# Patient Record
Sex: Female | Born: 1960
Health system: Southern US, Community
[De-identification: ages and names within clinical notes are randomized; demographics above are authoritative.]

## PROBLEM LIST (undated history)

## (undated) DIAGNOSIS — F32A Depression, unspecified: Secondary | ICD-10-CM

## (undated) DIAGNOSIS — Z9889 Other specified postprocedural states: Secondary | ICD-10-CM

## (undated) DIAGNOSIS — I10 Essential (primary) hypertension: Secondary | ICD-10-CM

## (undated) DIAGNOSIS — M459 Ankylosing spondylitis of unspecified sites in spine: Secondary | ICD-10-CM

## (undated) DIAGNOSIS — R011 Cardiac murmur, unspecified: Secondary | ICD-10-CM

## (undated) DIAGNOSIS — M1990 Unspecified osteoarthritis, unspecified site: Secondary | ICD-10-CM

## (undated) DIAGNOSIS — G4730 Sleep apnea, unspecified: Secondary | ICD-10-CM

## (undated) DIAGNOSIS — E785 Hyperlipidemia, unspecified: Secondary | ICD-10-CM

## (undated) DIAGNOSIS — D649 Anemia, unspecified: Secondary | ICD-10-CM

## (undated) DIAGNOSIS — G4700 Insomnia, unspecified: Secondary | ICD-10-CM

## (undated) DIAGNOSIS — J3089 Other allergic rhinitis: Secondary | ICD-10-CM

## (undated) DIAGNOSIS — J189 Pneumonia, unspecified organism: Secondary | ICD-10-CM

## (undated) DIAGNOSIS — R112 Nausea with vomiting, unspecified: Secondary | ICD-10-CM

## (undated) DIAGNOSIS — K219 Gastro-esophageal reflux disease without esophagitis: Secondary | ICD-10-CM

## (undated) DIAGNOSIS — F329 Major depressive disorder, single episode, unspecified: Secondary | ICD-10-CM

## (undated) DIAGNOSIS — F419 Anxiety disorder, unspecified: Secondary | ICD-10-CM

## (undated) HISTORY — DX: Hyperlipidemia, unspecified: E78.5

## (undated) HISTORY — DX: Essential (primary) hypertension: I10

## (undated) HISTORY — DX: Unspecified osteoarthritis, unspecified site: M19.90

## (undated) HISTORY — PX: ABDOMINAL HYSTERECTOMY: SHX81

## (undated) HISTORY — PX: COLONOSCOPY: SHX174

---

## 1997-09-25 ENCOUNTER — Encounter: Admission: RE | Admit: 1997-09-25 | Discharge: 1997-09-25 | Payer: Self-pay | Admitting: Obstetrics & Gynecology

## 1998-05-03 ENCOUNTER — Encounter: Admission: RE | Admit: 1998-05-03 | Discharge: 1998-05-03 | Payer: Self-pay | Admitting: Obstetrics & Gynecology

## 1998-05-05 ENCOUNTER — Ambulatory Visit (HOSPITAL_COMMUNITY): Admission: RE | Admit: 1998-05-05 | Discharge: 1998-05-05 | Payer: Self-pay | Admitting: Obstetrics & Gynecology

## 1998-05-05 ENCOUNTER — Encounter: Payer: Self-pay | Admitting: Obstetrics & Gynecology

## 2000-12-16 ENCOUNTER — Other Ambulatory Visit: Admission: RE | Admit: 2000-12-16 | Discharge: 2000-12-16 | Payer: Self-pay | Admitting: Family Medicine

## 2000-12-17 ENCOUNTER — Encounter: Payer: Self-pay | Admitting: Family Medicine

## 2000-12-17 ENCOUNTER — Encounter: Admission: RE | Admit: 2000-12-17 | Discharge: 2000-12-17 | Payer: Self-pay | Admitting: Family Medicine

## 2003-09-30 ENCOUNTER — Encounter: Admission: RE | Admit: 2003-09-30 | Discharge: 2003-09-30 | Payer: Self-pay | Admitting: Obstetrics and Gynecology

## 2003-09-30 ENCOUNTER — Other Ambulatory Visit: Admission: RE | Admit: 2003-09-30 | Discharge: 2003-09-30 | Payer: Self-pay | Admitting: Family Medicine

## 2003-09-30 ENCOUNTER — Encounter (INDEPENDENT_AMBULATORY_CARE_PROVIDER_SITE_OTHER): Payer: Self-pay | Admitting: Specialist

## 2003-11-11 ENCOUNTER — Encounter: Admission: RE | Admit: 2003-11-11 | Discharge: 2003-11-11 | Payer: Self-pay | Admitting: Obstetrics and Gynecology

## 2003-12-30 ENCOUNTER — Encounter: Admission: RE | Admit: 2003-12-30 | Discharge: 2003-12-30 | Payer: Self-pay | Admitting: Obstetrics and Gynecology

## 2004-01-04 ENCOUNTER — Encounter: Admission: RE | Admit: 2004-01-04 | Discharge: 2004-01-04 | Payer: Self-pay | Admitting: Obstetrics and Gynecology

## 2005-04-13 ENCOUNTER — Other Ambulatory Visit: Admission: RE | Admit: 2005-04-13 | Discharge: 2005-04-13 | Payer: Self-pay | Admitting: Family Medicine

## 2005-09-11 ENCOUNTER — Encounter (INDEPENDENT_AMBULATORY_CARE_PROVIDER_SITE_OTHER): Payer: Self-pay | Admitting: *Deleted

## 2005-09-11 ENCOUNTER — Inpatient Hospital Stay (HOSPITAL_COMMUNITY): Admission: RE | Admit: 2005-09-11 | Discharge: 2005-09-13 | Payer: Self-pay | Admitting: Obstetrics and Gynecology

## 2007-08-01 ENCOUNTER — Other Ambulatory Visit: Admission: RE | Admit: 2007-08-01 | Discharge: 2007-08-01 | Payer: Self-pay | Admitting: Family Medicine

## 2009-08-15 ENCOUNTER — Other Ambulatory Visit: Admission: RE | Admit: 2009-08-15 | Discharge: 2009-08-15 | Payer: Self-pay | Admitting: Family Medicine

## 2009-12-14 ENCOUNTER — Encounter: Admission: RE | Admit: 2009-12-14 | Discharge: 2009-12-14 | Payer: Self-pay | Admitting: Family Medicine

## 2010-09-03 DIAGNOSIS — J309 Allergic rhinitis, unspecified: Secondary | ICD-10-CM | POA: Insufficient documentation

## 2010-09-23 ENCOUNTER — Encounter: Payer: Self-pay | Admitting: *Deleted

## 2010-09-26 ENCOUNTER — Other Ambulatory Visit: Payer: Self-pay | Admitting: Family Medicine

## 2010-09-26 DIAGNOSIS — Z1239 Encounter for other screening for malignant neoplasm of breast: Secondary | ICD-10-CM

## 2010-09-26 DIAGNOSIS — Z1231 Encounter for screening mammogram for malignant neoplasm of breast: Secondary | ICD-10-CM

## 2010-10-09 ENCOUNTER — Ambulatory Visit
Admission: RE | Admit: 2010-10-09 | Discharge: 2010-10-09 | Disposition: A | Discharge status: Home / Self Care | Payer: Commercial Managed Care - PPO | Source: Ambulatory Visit | Attending: Family Medicine | Admitting: Family Medicine

## 2010-10-09 DIAGNOSIS — Z1231 Encounter for screening mammogram for malignant neoplasm of breast: Secondary | ICD-10-CM

## 2010-12-04 DIAGNOSIS — M159 Polyosteoarthritis, unspecified: Secondary | ICD-10-CM | POA: Insufficient documentation

## 2011-01-19 NOTE — Group Therapy Note (Signed)
NAME:  Adrienne Campbell, Adrienne Campbell                  ACCOUNT NO.:  [id number]   MEDICAL RECORD NO.:  [id number]                   PATIENT TYPE:  OUT   LOCATION:  [facility]                           FACILITY:  [facility]   PHYSICIAN:  Tinnie Gens, MD                     DATE OF BIRTH:  1961-03-25   DATE OF SERVICE:  09/30/2003                                    CLINIC NOTE   CHIEF COMPLAINT:  Abnormal bleeding.   HISTORY OF PRESENT ILLNESS:  The patient is a 50 year old gravida 3 para 3-0-  0-3 who is referred from the [facility] Department for heavy vaginal  bleeding.  The patient reports that for the last 3 years her periods have  gotten longer and longer.  They are also associated with a lot of pain and  pressure in her lower abdomen.  She reports currently she is bleeding 2  weeks on and 2 weeks off and the cycle tends to repeat.  She wonders if she  is going through the change of life.   PAST MEDICAL HISTORY:  She has a history of pneumonia, history of a heart  murmur with pregnancy that has resolved, history of depression, history of  elevated cholesterol.   PAST SURGICAL HISTORY:  Negative.   PAST GYNECOLOGICAL HISTORY:  Menarche at age 81.  Cycles regular every 28  days, last 14 days.  She does have painful periods.  Contraception is her  partner had a vasectomy.  Last Pap was in December 2004.  She has a history  of abnormal Paps and is status post cryo in the past.  Last mammogram was 3  years ago and normal.   FAMILY HISTORY:  Coronary artery disease in mother and sister and  grandfather.  Hypertension in mother and grandmother.  She also has family  history of Parkinson's disease.   SOCIAL HISTORY:  She does work outside the home.  She lives with her husband  and three kids.  She is a nonsmoker.  She does drink alcohol once a month.   MEDICATIONS:  Include Effexor XR and vitamins.   ALLERGIES:  SULFA.   FOURTEEN-POINT REVIEW OF SYSTEMS:  She reports fevers and night  sweats,  fatigue, weight gain, hot flashes, pain with intercourse, and constipation.   PHYSICAL EXAMINATION:  VITAL SIGNS:  Temperature is 99.2, pulse 91, blood  pressure 137/81, weight 145.4.  GENERAL:  She is a well-developed, well-nourished white female in no acute  distress.  ABDOMEN:  Soft, nontender, nondistended.  GENITOURINARY:  She has normal external female genitalia.  She has some  uterine prolapse noted.  The vagina is rugated.  Active bleeding is noted.  The cervix is large but without lesion.  The uterus is approximately 10-12  week size, anteverted, no distinct mass felt.  The adnexa were not tender  and had no masses.   PROCEDURE:  The cervix was cleaned with Betadine x3 and then grasped with a  single tooth tenaculum.  The uterus was sounded to 9 cm.  Endometrial biopsy  was obtained without difficulty.   IMPRESSION:  Menorrhagia, unclear etiology.   PLAN:  1. Endometrial biopsy today.  2. Pelvic ultrasound.  3. Check TSH, FSH, and LH.  4. Follow up in 6 weeks for results.                                               Tinnie Gens, MD    TP/MEDQ  D:  09/30/2003  T:  09/30/2003  Job:  [id number]

## 2011-01-19 NOTE — Discharge Summary (Signed)
NAMEGODDESS, GEBBIA        ACCOUNT NO.:  [id number]   MEDICAL RECORD NO.:  [id number]          PATIENT TYPE:  INP   LOCATION:  9319                          [facility]   PHYSICIAN:  Duke Salvia. Marcelle Overlie, M.D.DATE OF BIRTH:  09-27-1960   DATE OF ADMISSION:  09/11/2005  DATE OF DISCHARGE:  09/13/2005                                 DISCHARGE SUMMARY   DISCHARGE DIAGNOSES:  1.  Symptomatic uterine prolapse, cystocele and rectocele.  2.  Laparoscopically-assisted vaginal hysterectomy, anterior and posterior      repair.   SUMMARY OF THE HISTORY AND PHYSICAL EXAMINATION:  Please refer to the  admission H&P for details.  Briefly, a 50 year old G3, P3.  Her husband has  had a vasectomy.  She had symptomatic uterine prolapse, cystocele and  rectocele and presents for correction.   HOSPITAL COURSE:  On January 9, under general anesthesia, the patient  underwent LAVH, cystocele and rectocele repair.  The catheter was removed  the following a.m.  The first postop day she was afebrile.  Her diet was  advanced.  The vaginal pack was removed.  Hemoglobin was 9.8.  The following  day, September 13, 2005, on postop day #2, she was afebrile, ambulating  without difficulty, afebrile, established normal voiding pattern and was  ready for discharge at that point.   Pathology report showed chronic cervicitis, proliferative endometrium,  superficial adenomyosis.  Preop hemoglobin 13.9.  On September 12, 2005 postop  hemoglobin was 9.8.   DISPOSITION:  The patient discharged on Tylox p.r.n. pain.  Will return to  the office in one week.  Advised to report any increased vaginal bleeding,  incisional redness or drainage, persistent nausea and vomiting or urinary  complaints.  She was given specific instructions regarding diet, sex,  exercise.   CONDITION:  Good.   ACTIVITY:  Graded, increase.      Richard M. Marcelle Overlie, M.D.  Electronically Signed     RMH/MEDQ  D:  10/11/2005  T:   10/12/2005  Job:  [id number]

## 2011-01-19 NOTE — Group Therapy Note (Signed)
NAME:  Adrienne Campbell, Adrienne Campbell                  ACCOUNT NO.:  [id number]   MEDICAL RECORD NO.:  [id number]                   PATIENT TYPE:  OUT   LOCATION[facility]                           [facility]   PHYSICIAN:  Argentina Donovan, MD                     DATE OF BIRTH:  1961-05-18   DATE OF SERVICE:  11/11/2003                                    CLINIC NOTE   REASON FOR VISIT:  The patient is a 50 year old gravida 3 para 3-0-0-3 who  was in because of heavy vaginal bleeding the end of January had endometrial  biopsy which showed degenerating secretory endometrium.  She is a nonsmoker  who continues to have irregular periods that last for 10 days, heavy for  about 5, and then she will be off for 2 weeks.  We talked about putting her  on the oral contraceptives.  We are going to start her on Ovcon-35 in a  tricyclic manner and hopefully control the bleeding that way.   IMPRESSION:  Dysfunctional uterine bleeding.  She is also taking iron for  anemia and will be referred back to [facility].                                               Argentina Donovan, MD    PR/MEDQ  D:  11/11/2003  T:  11/11/2003  Job:  [id number]

## 2011-01-19 NOTE — Discharge Summary (Signed)
NAMEGREENLY, RARICK        ACCOUNT NO.:  [id number]   MEDICAL RECORD NO.:  [id number]          PATIENT TYPE:  INP   LOCATION:  9319                          [facility]   PHYSICIAN:  Duke Salvia. Marcelle Overlie, M.D.DATE OF BIRTH:  1961/06/08   DATE OF ADMISSION:  09/11/2005  DATE OF DISCHARGE:  09/13/2005                                 DISCHARGE SUMMARY   DISCHARGE DIAGNOSES:  1.  Symptomatic uterine descensus.  2.  Laparoscopic assisted vaginal hysterectomy and anterior and posterior      repair.   SUMMARY OF THE HISTORY AND PHYSICAL EXAMINATION:  Please see admission H&P  for details.  Briefly, a 50 year old G3, P3 with symptomatic uterine  prolapse, cystocele and rectocele, who presents for repair.   HOSPITAL COURSE:  On January 9 under general anesthesia, the patient  underwent an LAVH and A&P repair.  The first postop day the catheter was  removed, the vaginal pack was removed, hemoglobin was 9.8, her diet was  advanced and she was encouraged to ambulate.  By the second day, January 11,  postop day #2 she remained afebrile, had established normal urinary  function, was afebrile, was ambulating without difficulty, her catheter had  been removed at that point, and she was voiding and ready for discharge.   LABORATORY DATA:  Preop hemoglobin on January 8:  WBC 9.3, hemoglobin 13.9,  hematocrit 40.5.  CBC on September 12, 2005:  WBC at 10.2, hemoglobin 9.8,  hematocrit 28.2.  Blood type is A negative.  Antibody screen was negative.   DISPOSITION:  The patient was discharged on Tylox p.r.n. pain, ferrous  sulfate once daily and will return to the office in one week.  Advised to  report any increased vaginal pain or bleeding, urinary complaints, fever  over 101, persistent nausea and vomiting.  She was given specific  instructions regarding diet, sex and exercises.   CONDITION:  Good.   ACTIVITY:  Graded increased.      Richard M. Marcelle Overlie, M.D.  Electronically  Signed     RMH/MEDQ  D:  10/01/2005  T:  10/01/2005  Job:  [id number]

## 2011-01-19 NOTE — Op Note (Signed)
NAMELEANER, MORICI        ACCOUNT NO.:  [id number]   MEDICAL RECORD NO.:  [id number]          PATIENT TYPE:  INP   LOCATION:  9399                          [facility]   PHYSICIAN:  Duke Salvia. Marcelle Overlie, M.D.DATE OF BIRTH:  Jan 17, 1961   DATE OF PROCEDURE:  09/11/2005  DATE OF DISCHARGE:                                 OPERATIVE REPORT   PREOPERATIVE DIAGNOSIS:  Symptomatic uterine descensus with cystocele and  rectocele.   POSTOPERATIVE DIAGNOSIS:  Symptomatic uterine descensus with cystocele and  rectocele.   PROCEDURE:  Laparoscopically-assisted vaginal hysterectomy, anterior and  posterior repair.   SURGEON:  Duke Salvia. Marcelle Overlie, M.D.   ASSISTANT:  Guy Sandifer. Henderson Cloud, M.D.   ANESTHESIA:  General endotracheal.   COMPLICATIONS:  None.   DRAINS:  Foley catheter.   BLOOD LOSS:  300 mL.   SPECIMENS REMOVED:  Uterus.   PROCEDURE AND FINDINGS:  The patient was taken to the operating room and  after an adequate level of general endotracheal anesthesia was obtained with  the patient's legs in stirrups, the abdomen, perineum and vagina were  prepped and draped in the usual manner for vaginal procedures.  A Foley  catheter was positioned draining clear urine.  A Hulka tenaculum was  positioned.  Attention directed to the umbilicus, where a 2 cm subumbilical  incision was made.  The Veress needle was introduced without difficulty, its  intra-abdominal position was verified by pressure and water testing.  After  a 2.5 L pneumoperitoneum was then created, a laparoscopic trocar and sleeve  was then introduced without difficulty.  There was no evidence of any  bleeding or trauma.  Three fingerbreadths above the symphysis midline, a 5  mm trocar was inserted.  The patient then placed in Trendelenburg, pelvic  findings as follows:  The uterus itself was normal size with a small, clear-  walled functional cyst on the right, approximately 3 cm, that was drained of  clear fluid  only.  No periadnexal adhesions on the right or left, no  evidence of any endometriosis.  The liver edge, gallbladder, appendix all  were unremarkable.  After these findings were noted, the utero-ovarian  pedicle was coagulated and cut with the Gyrus PK instrument down to and  including the round ligament.  The vaginal portion of the procedure was  started at that point.  The legs were extended, a weighted speculum was  positioned, and the cervicovaginal mucosa was incised, a posterior culdotomy  performed without difficulty.  The bladder was advanced superiorly.  Using  the Gyrus handheld PK, the uterosacral ligament was clamped, divided.  The  bladder was advanced superiorly until the bladder reflection could be  identified, peritoneum was then entered without difficulty and a retractor  used to gently elevate the bladder out of the field.  In sequential manner  staying close to the uterus, the cardinal ligament, uterine vasculature  pedicles and upper broad ligament pedicles were clamped, coagulated and  divided with the Gyrus handheld PK.  The fundus of the uterus then delivered  posteriorly.  The remaining pedicles were clamped with curved Heaney clamps  and the specimens removed.  These two pedicles were and tied with free tie  of 0 Vicryl.  The cuff was then closed from 3 to 9 o'clock with a running of  2-0 Vicryl suture.  A McCall culdoplasty suture was then used to approximate  left uterosacral ligament, taking up began posterior peritoneum across to  the right uterosacral ligament, which was tied down for extra support.  The  cuff was then closed from right to left with interrupted 2-0 Monocryl  sutures.  Allis clamps were then placed at the anterior margin.  The mucosa  was then divided to the base the bladder.  The surrounding paravesical  fascia was dissected free with sharp and blunt dissection.  The paravesical  fascia was then plicated in the midline with 2-0 Vicryl sutures.   Excess  mucosa was trimmed and the mucosa was closed with 2-0 Monocryl sutures with  excellent hemostasis.  Posteriorly a small triangle of perineal skin was  excised and the posterior vaginal mucosa was divided in the midline  approximately two-thirds of the way up the posterior vaginal wall with sharp  and blunt dissection used to separate the underlying perirectal tissue.  The  perirectal fascia was then plicated to the midline with 2-0 Vicryl  interrupted sutures, reducing the rectocele.  A small amount of excess  vaginal mucosa posteriorly was removed and mucosa closed with interrupted 2-  0 Monocryl sutures.  Vicryl 4-0 Rapide sutures used on the perineum with  excellent hemostasis.  A one-inch vaginal pack was positioned, a Foley  catheter positioned draining clear urine.  Repeat laparoscopy performed at  that point.  The pelvis was irrigated saline, the pressure was reduced.  Excellent hemostasis was noted.  Instruments were removed, gas allowed to  escape.  The deep fascia closed with 4-0 Dexon subcuticular sutures and  Dermabond.  She tolerated this well, went to the recovery room in good  condition.      Richard M. Marcelle Overlie, M.D.  Electronically Signed     RMH/MEDQ  D:  09/11/2005  T:  09/11/2005  Job:  [id number]

## 2011-01-19 NOTE — H&P (Signed)
Adrienne Campbell, Adrienne Campbell        ACCOUNT NO.:  [id number]   MEDICAL RECORD NO.:  [id number]          PATIENT TYPE:  INP   LOCATION:  NA                            [facility]   PHYSICIAN:  Adrienne Campbell. Marcelle Campbell, M.D.DATE OF BIRTH:  15-Jun-1961   DATE OF ADMISSION:  09/11/2005  DATE OF DISCHARGE:                                HISTORY & PHYSICAL   CHIEF COMPLAINT:  Uterine prolapse.   HISTORY OF PRESENT ILLNESS:  A 50 year old G3, P3.  Her husband has had a  vasectomy.  I saw her on October of last year on referral from Dr. Dorothe Campbell  at [facility].  She had also seen Adrienne Campbell. Adrienne Campbell, M.D., for  evaluation of some urinary complaints, and he felt that she had some  significant pelvic relaxation symptoms, complaining mainly of dyspareunia,  increased pressure when she is standing, and also pelvic pain.  Her  evaluation showed a definite cystocele and rectocele with the rectocele  being more pronounced, a moderate degree of uterine descensus.  She did have  a Mirena IUD placed to try and help with some of her irregular bleeding  also.   Ultrasound in our office dated June 14, 2005, showed the IUD noted,  adnexa unremarkable, uterus was normal size.  She presents now for LAVH,  possible BSO, with A&P repair.  This procedure, including risks of bleeding,  infection, transfusion, adjacent organ injury with a possible need for open  or additional surgery, and other risks regarding wound infection, phlebitis  and her expected recovery time reviewed.  She prefers to have her otherwise  normal-looking ovaries conserved.   PAST MEDICAL HISTORY:  Allergies:  SULFA.   Obstetrical history:  Three vaginal deliveries at term.   Current medications:  Nasonex and Effexor XR 150 mg daily.   Of note, her IUD was removed prior to surgery.   FAMILY HISTORY:  Significant for mother with hypertension and a prior MI.  Father who has Parkinson's disease.  Mother who had a history of ovarian  cysts and fibroids.   PHYSICAL EXAMINATION:  VITAL SIGNS:  Temperature 98.2, blood pressure  120/80.  HEENT:  Unremarkable.  NECK:  Supple without masses.  LUNGS:  Clear.  CARDIOVASCULAR:  Regular rate and rhythm without murmurs, rubs or gallops  noted.  BREASTS:  Without masses.  ABDOMEN:  Soft, flat, nontender.  PELVIC:  Normal external genitalia.  Vagina and cervix revealed cystocele  and rectocele, no definite evidence of an enterocele, with moderate uterine  prolapse.  Uterus upper limit of normal size.  Adnexa unremarkable.  EXTREMITIES:  Unremarkable.  NEUROLOGIC:  Unremarkable.   IMPRESSION:  Symptomatic uterine prolapse with cystocele and rectocele.   PLAN:  LAVH, possible BSO, with anterior and posterior colporrhaphy.  Procedure and risks reviewed as above.      Adrienne Campbell, M.D.  Electronically Signed     RMH/MEDQ  D:  09/07/2005  T:  09/07/2005  Job:  [id number]

## 2011-01-19 NOTE — Group Therapy Note (Signed)
NAME:  Adrienne Campbell, Adrienne Campbell                  ACCOUNT NO.:  [id number]   MEDICAL RECORD NO.:  [id number]                   PATIENT TYPE:  OUT   LOCATION[facility]                           [facility]   PHYSICIAN:  Argentina Donovan, MD                     DATE OF BIRTH:  October 18, 1960   DATE OF SERVICE:  01/04/2004                                    CLINIC NOTE   REASON FOR VISIT:  This is a 50 year old white female, gravida 3, para 3-0-0-  3, who has had problems with dysfunctional uterine bleeding sometimes.  She  has had endometrial biopsy confirming this problem.   HISTORY:  She was placed on oral contraceptives, which she broke through on  several different doses.  She cannot afford to get an endometrial ablation,  so she is trying the [place] IUD in order to see if this would help her  control the problem.  She is on intermittent chronic Macrodantin for  recurrent urinary tract infections, post coital.  She also takes Effexor-XR.  She works four part-time jobs and has a disabled husband who stays home and  takes care of the children.   CLINICAL COURSE:  Today she had a an IPT which was negative.  And in  placement of Jearld Adjutant IUD without problems, with the exception of a very acute  anterior flexed uterus.  This was sounded to 7 cm.  We have discussed with  her the possibility that this may not work, and that she may continue having  heavy bleeding.  We are going to do a CBC because she has not been on iron  because of constipation, and she has had long extended periods.   IMPRESSION:  1. Dysfunctional uterine bleeding, intractable.  Attempt to control with     [facility] IUD.  2. Menometrorrhagia.  CBC being done.  3. Chronic post-coital urinary tract infections.  On Macrodantin.                                               Argentina Donovan, MD    PR/MEDQ  D:  01/04/2004  T:  01/05/2004  Job:  [id number]

## 2011-08-21 ENCOUNTER — Other Ambulatory Visit: Payer: Self-pay | Admitting: Family Medicine

## 2011-08-21 ENCOUNTER — Ambulatory Visit
Admission: RE | Admit: 2011-08-21 | Discharge: 2011-08-21 | Disposition: A | Discharge status: Home / Self Care | Payer: Commercial Managed Care - PPO | Source: Ambulatory Visit | Attending: Family Medicine | Admitting: Family Medicine

## 2011-08-21 DIAGNOSIS — R52 Pain, unspecified: Secondary | ICD-10-CM

## 2012-02-02 DIAGNOSIS — M47819 Spondylosis without myelopathy or radiculopathy, site unspecified: Secondary | ICD-10-CM | POA: Insufficient documentation

## 2012-03-03 DIAGNOSIS — G4700 Insomnia, unspecified: Secondary | ICD-10-CM | POA: Insufficient documentation

## 2012-08-22 ENCOUNTER — Ambulatory Visit (INDEPENDENT_AMBULATORY_CARE_PROVIDER_SITE_OTHER): Payer: 59 | Admitting: Pharmacist

## 2012-08-22 ENCOUNTER — Encounter: Payer: Self-pay | Admitting: Pharmacist

## 2012-08-22 VITALS — BP 135/89 | HR 89 | Ht 65.0 in | Wt 165.1 lb

## 2012-08-22 DIAGNOSIS — M479 Spondylosis, unspecified: Secondary | ICD-10-CM

## 2012-08-22 DIAGNOSIS — G4700 Insomnia, unspecified: Secondary | ICD-10-CM

## 2012-08-22 DIAGNOSIS — M47819 Spondylosis without myelopathy or radiculopathy, site unspecified: Secondary | ICD-10-CM

## 2012-08-22 DIAGNOSIS — J309 Allergic rhinitis, unspecified: Secondary | ICD-10-CM

## 2012-08-22 DIAGNOSIS — Z79890 Hormone replacement therapy (postmenopausal): Secondary | ICD-10-CM

## 2012-08-22 DIAGNOSIS — F329 Major depressive disorder, single episode, unspecified: Secondary | ICD-10-CM

## 2012-08-22 DIAGNOSIS — E7800 Pure hypercholesterolemia, unspecified: Secondary | ICD-10-CM

## 2012-08-22 DIAGNOSIS — M159 Polyosteoarthritis, unspecified: Secondary | ICD-10-CM

## 2012-08-22 NOTE — Patient Instructions (Addendum)
Thanks for coming in today!

## 2012-08-22 NOTE — Assessment & Plan Note (Signed)
Following medication review, no suggestions for change.   Complete medication list provided to patient.  Total time in face to face medication review: 20 minutes.  Patient seen with: Juanita Craver, PharmD Candidate

## 2012-08-22 NOTE — Progress Notes (Signed)
  Subjective:    Patient ID: Adrienne Campbell, female    DOB: 1961/04/01, 51 y.o.   MRN: [id number]  HPI Patient arrives with depressed/anxious mood.   She reports losing her son in an auto accident in 03/2012 (this year).     Reports seeing Dr. Docia Chuck at [facility] as primary care provider, Dr. Zenovia Jordan at [facility] as rheumatologist, Dr. Irena Cords at [facility].    Reports being diagnosed with spondylarthritis since summer 2013 and states she plans to start Enbrel as soon as possible.   She is clearly anxious and nervous about staring enbrel injections.      Review of Systems     Objective:   Physical Exam        Assessment & Plan:  Following medication review, no suggestions for change.  Encouraged patient about  Complete medication list provided to patient.  Total time in face to face medication review: 20 minutes.  Patient seen with: Juanita Craver, PharmD Candidate

## 2012-08-25 NOTE — Progress Notes (Signed)
Patient ID: Adrienne Campbell, female   DOB: [birth year], 51 y.o.   MRN: [id number] Reviewed: Agree with Dr. Macky Lower management and documentation.

## 2013-06-15 ENCOUNTER — Ambulatory Visit: Payer: 59 | Admitting: Family Medicine

## 2013-06-23 ENCOUNTER — Ambulatory Visit: Payer: Self-pay | Admitting: Family Medicine

## 2013-07-28 ENCOUNTER — Other Ambulatory Visit: Payer: Self-pay

## 2013-07-28 DIAGNOSIS — Z1231 Encounter for screening mammogram for malignant neoplasm of breast: Secondary | ICD-10-CM

## 2013-08-07 ENCOUNTER — Encounter: Payer: Self-pay | Admitting: *Deleted

## 2013-08-07 ENCOUNTER — Encounter: Payer: 59 | Attending: Family Medicine | Admitting: *Deleted

## 2013-08-07 VITALS — Ht 64.75 in | Wt 158.2 lb

## 2013-08-07 DIAGNOSIS — Z713 Dietary counseling and surveillance: Secondary | ICD-10-CM | POA: Diagnosis not present

## 2013-08-07 DIAGNOSIS — E7800 Pure hypercholesterolemia, unspecified: Secondary | ICD-10-CM | POA: Insufficient documentation

## 2013-08-07 NOTE — Progress Notes (Signed)
  Medical Nutrition Therapy:  Appt start time: 0800 end time:  0900.  Assessment:  Primary concerns today: Patient lost her teenage son in an accident a year ago and since then she has had multiple medical problems including flare ups of her arthritis to the point of not being able to walk, elevation in her cholesterol, and additional inflammation problems. She states she has attended several [facility] programs including [facility] and a Weight Loss Class provided by Dr. Wyona Almas, RD, LDN. She states she has strong family history for hyperlipidemia which was being well controlled until her son died. She has been omitting gluten in an effort to control the inflammation problems and she feels it has helped some. She is able to walk without a walker now and is swimming in addition to going to the gym several days a week. She uses My Fitness Pal successfully to track her calorie and activity levels but would like more information as to how she can make appropriate food choices to match the guidelines for this APP.  Preferred Learning Style:   No preference indicated   Learning Readiness:   Ready  Change in progress  MEDICATIONS: see list   DIETARY INTAKE: 24-hr recall:  B ( AM): skips usually  Snk ( AM): none  L ( PM): fresh fruit x 1-2, left overs including rice and black beans or meat, occasionally a gluten free starch, vegetables, water or vitamin water Snk ( PM): occasionally protein snack bar D ( PM): eat out usually; usually salmon or other lean meat, gluten free and spice free foods, vegetables, or salad, unsweet tea Snk ( PM): big eating time, honey cream peanut butter on variety of celery or crackers, yogurt Beverages: water, vitamin water, unsweet tea  Usual physical activity: swimming now 3 days a week and gym previous to son's death, 1-1/2 hours 6 days a week  Estimated energy needs: 1200-1400 calories 145 g carbohydrates 95 g protein 35 g fat  Intervention:  Nutrition  counseling provided including calorie values for all food groups including macro nutrients of carbohydrate, protein and fat. Provided meal plan based on food groups to accommodate My Fitness Pal APP of 45% Carb, 30% Protein and 25% Fat. Also discussed her choice to eat gluten free and plan to provide her with requested information on Anti-inflammatory Diet.   Teaching Method Utilized: all of the following Visual Auditory Hands on  Handouts given during visit include: Carb Counting and Food Label handouts Meal Plan Card  Barriers to learning/adherence to lifestyle change: grief over the loss of her child, Fredric Mare  Demonstrated degree of understanding via:  Teach Back   Monitoring/Evaluation:  Dietary intake, exercise, reading food labels, and body weight prn.

## 2013-08-14 ENCOUNTER — Encounter: Payer: Self-pay | Admitting: *Deleted

## 2013-09-07 ENCOUNTER — Ambulatory Visit
Admission: RE | Admit: 2013-09-07 | Discharge: 2013-09-07 | Disposition: A | Discharge status: Home / Self Care | Payer: 59 | Source: Ambulatory Visit

## 2013-09-07 DIAGNOSIS — Z1231 Encounter for screening mammogram for malignant neoplasm of breast: Secondary | ICD-10-CM

## 2013-10-26 ENCOUNTER — Ambulatory Visit (INDEPENDENT_AMBULATORY_CARE_PROVIDER_SITE_OTHER): Payer: Self-pay | Admitting: Family Medicine

## 2013-10-26 DIAGNOSIS — Z713 Dietary counseling and surveillance: Secondary | ICD-10-CM

## 2014-05-13 ENCOUNTER — Other Ambulatory Visit: Payer: Self-pay | Admitting: Orthopedic Surgery

## 2014-06-17 NOTE — Pre-Procedure Instructions (Signed)
Adrienne Campbell  06/17/2014   Your procedure is scheduled on:  June 28, 2014  Report to [facility] Admitting at 7:55 AM.  Call this number if you have problems the morning of surgery: [phone]   Remember:   Do not eat food or drink liquids after midnight.   Take these medicines the morning of surgery with A SIP OF WATER: venlafaxine XR (EFFEXOR XR)    Do not wear jewelry, make-up or nail polish.  Do not wear lotions, powders, or perfumes. You may wear deodorant.  Do not shave 48 hours prior to surgery. Men may shave face and neck.  Do not bring valuables to the hospital.  [facility] is not responsible for any belongings or valuables.               Contacts, dentures or bridgework may not be worn into surgery.  Leave suitcase in the car. After surgery it may be brought to your room.  For patients admitted to the hospital, discharge time is determined by your   treatment team.               Patients discharged the day of surgery will not be allowed to drive home.  Name and phone number of your driver: family/friend     Please read over the following fact sheets that you were given: Pain Booklet, Coughing and Deep Breathing and Surgical Site Infection Prevention

## 2014-06-18 ENCOUNTER — Encounter (HOSPITAL_COMMUNITY)
Admission: RE | Admit: 2014-06-18 | Discharge: 2014-06-18 | Disposition: A | Discharge status: Home / Self Care | Payer: 59 | Source: Ambulatory Visit | Attending: Orthopedic Surgery | Admitting: Orthopedic Surgery

## 2014-06-18 ENCOUNTER — Encounter (HOSPITAL_COMMUNITY): Payer: Self-pay | Admitting: Pharmacy Technician

## 2014-06-18 ENCOUNTER — Encounter (HOSPITAL_COMMUNITY): Payer: Self-pay

## 2014-06-18 DIAGNOSIS — M1612 Unilateral primary osteoarthritis, left hip: Secondary | ICD-10-CM | POA: Diagnosis not present

## 2014-06-18 DIAGNOSIS — Z01818 Encounter for other preprocedural examination: Secondary | ICD-10-CM | POA: Insufficient documentation

## 2014-06-18 LAB — PROTIME-INR
INR: 0.98 (ref 0.00–1.49)
Prothrombin Time: 13.1 seconds (ref 11.6–15.2)

## 2014-06-18 LAB — CBC WITH DIFFERENTIAL/PLATELET
Basophils Absolute: 0 K/uL (ref 0.0–0.1)
Basophils Relative: 1 % (ref 0–1)
Eosinophils Absolute: 0.4 K/uL (ref 0.0–0.7)
Eosinophils Relative: 5 % (ref 0–5)
HCT: 41.6 % (ref 36.0–46.0)
Hemoglobin: 14 g/dL (ref 12.0–15.0)
Lymphocytes Relative: 46 % (ref 12–46)
Lymphs Abs: 3.9 K/uL (ref 0.7–4.0)
MCH: 34.2 pg — ABNORMAL HIGH (ref 26.0–34.0)
MCHC: 33.7 g/dL (ref 30.0–36.0)
MCV: 101.7 fL — ABNORMAL HIGH (ref 78.0–100.0)
Monocytes Absolute: 0.6 K/uL (ref 0.1–1.0)
Monocytes Relative: 8 % (ref 3–12)
Neutro Abs: 3.3 K/uL (ref 1.7–7.7)
Neutrophils Relative %: 40 % — ABNORMAL LOW (ref 43–77)
Platelets: 340 K/uL (ref 150–400)
RBC: 4.09 MIL/uL (ref 3.87–5.11)
RDW: 12 % (ref 11.5–15.5)
WBC: 8.1 K/uL (ref 4.0–10.5)

## 2014-06-18 LAB — URINALYSIS, ROUTINE W REFLEX MICROSCOPIC
BILIRUBIN URINE: NEGATIVE
Glucose, UA: NEGATIVE mg/dL
Hgb urine dipstick: NEGATIVE
KETONES UR: NEGATIVE mg/dL
Leukocytes, UA: NEGATIVE
NITRITE: NEGATIVE
PH: 5.5 (ref 5.0–8.0)
PROTEIN: NEGATIVE mg/dL
Specific Gravity, Urine: 1.018 (ref 1.005–1.030)
Urobilinogen, UA: 0.2 mg/dL (ref 0.0–1.0)

## 2014-06-18 LAB — BASIC METABOLIC PANEL
ANION GAP: 14 (ref 5–15)
BUN: 16 mg/dL (ref 6–23)
CALCIUM: 9.3 mg/dL (ref 8.4–10.5)
CO2: 22 mEq/L (ref 19–32)
CREATININE: 0.64 mg/dL (ref 0.50–1.10)
Chloride: 100 mEq/L (ref 96–112)
Glucose, Bld: 104 mg/dL — ABNORMAL HIGH (ref 70–99)
Potassium: 4.3 mEq/L (ref 3.7–5.3)
Sodium: 136 mEq/L — ABNORMAL LOW (ref 137–147)

## 2014-06-18 LAB — SURGICAL PCR SCREEN
MRSA, PCR: NEGATIVE
STAPHYLOCOCCUS AUREUS: NEGATIVE

## 2014-06-18 LAB — APTT: aPTT: 30 s (ref 24–37)

## 2014-06-18 MED ORDER — CHLORHEXIDINE GLUCONATE 4 % EX LIQD
60.0000 mL | Freq: Once | CUTANEOUS | Status: DC
Start: 2014-06-18 — End: 2014-06-19

## 2014-06-26 NOTE — H&P (Signed)
TOTAL HIP ADMISSION H&P  Patient is admitted for left total hip arthroplasty.  Subjective:  Chief Complaint: left hip pain  HPI: Adrienne Campbell, 53 y.o. female, has a history of pain and functional disability in the left hip(s) due to arthritis and patient has failed non-surgical conservative treatments for greater than 12 weeks to include NSAID's and/or analgesics, flexibility and strengthening excercises, supervised PT with diminished ADL's post treatment, use of assistive devices and weight reduction as appropriate.  Onset of symptoms was gradual starting 2 years ago with gradually worsening course since that time.The patient noted no past surgery on the left hip(s).  Patient currently rates pain in the left hip at 10 out of 10 with activity. Patient has night pain, worsening of pain with activity and weight bearing, trendelenberg gait, pain that interfers with activities of daily living and pain with passive range of motion. Patient has evidence of joint space narrowing by imaging studies. This condition presents safety issues increasing the risk of falls.  There is no current active infection.  Patient Active Problem List   Diagnosis Date Noted  . Insomnia 03/03/2012  . 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 Osteoarthritis of multiple joints 12/04/2010  . Allergic rhinitis 09/03/2010  . Hypercholesteremia 09/04/2007  . Postmenopausal HRT (hormone replacement therapy) 09/03/2005  . Depression 09/03/2001   Past Medical History  Diagnosis Date  . Hyperlipidemia   . Hypertension   . Arthritis     Past Surgical History  Procedure Laterality Date  . Abdominal hysterectomy      No prescriptions prior to admission   Allergies  Allergen Reactions  . Nsaids Hives    Specifically meloxicam and ibuprofen reported  . Onion Swelling  . Sulfa Antibiotics Swelling    Patient reports tongue swells  . Codeine Nausea And Vomiting  . Fluticasone     ulcers  . Gluten Meal Diarrhea  .  Tramadol Hives    History  Substance Use Topics  . Smoking status: Never Smoker   . Smokeless tobacco: Never Used  . Alcohol Use: Not on file    No family history on file.   Review of Systems  Constitutional: Positive for malaise/fatigue and diaphoresis.  HENT: Negative.   Eyes: Negative.   Gastrointestinal: Positive for constipation.  Genitourinary: Positive for dysuria and urgency.  Musculoskeletal: Positive for joint pain and myalgias.  Skin: Negative.   Neurological: Negative.   Endo/Heme/Allergies: Negative.   Psychiatric/Behavioral: The patient has insomnia.     Objective:  Physical Exam  Constitutional: She is oriented to person, place, and time. She appears well-developed and well-nourished.  HENT:  Head: Normocephalic and atraumatic.  Eyes: Pupils are equal, round, and reactive to light.  Neck: Normal range of motion. Neck supple.  Cardiovascular: Intact distal pulses.   Respiratory: Effort normal.  Musculoskeletal: She exhibits tenderness.  the patient has good strength and good range of motion in her right hip.  Patient's left hip does have intense pain with internal rotation and log roll.  She has tenderness with palpation of the groin.  Patient's right knee also has tenderness with palpation over the medial joint line.  Palpable osteophytes.  Mild effusion.  No erythema or warmth.  She is neurovascularly intact distally.  Neurological: She is alert and oriented to person, place, and time.  Skin: Skin is warm and dry.  Psychiatric: She has a normal mood and affect. Her behavior is normal. Judgment and thought content normal.    Vital signs in last 24 hours:  Labs:   Estimated body mass index is 26.52 kg/(m^2) as calculated from the following:   Height as of 08/14/13: 5' 4.75" (1.645 m).   Weight as of 08/07/13: 71.759 kg (158 lb 3.2 oz).   Imaging Review Plain radiographs demonstrate end-stage arthritis involving the superior joint  space.  Assessment/Plan:  End stage arthritis, left hip(s)  The patient history, physical examination, clinical judgement of the provider and imaging studies are consistent with end stage degenerative joint disease of the left hip(s) and total hip arthroplasty is deemed medically necessary. The treatment options including medical management, injection therapy, arthroscopy and arthroplasty were discussed at length. The risks and benefits of total hip arthroplasty were presented and reviewed. The risks due to aseptic loosening, infection, stiffness, dislocation/subluxation,  thromboembolic complications and other imponderables were discussed.  The patient acknowledged the explanation, agreed to proceed with the plan and consent was signed. Patient is being admitted for inpatient treatment for surgery, pain control, PT, OT, prophylactic antibiotics, VTE prophylaxis, progressive ambulation and ADL's and discharge planning.The patient is planning to be discharged home with home health services

## 2014-06-27 MED ORDER — CEFAZOLIN SODIUM-DEXTROSE 2-3 GM-% IV SOLR
2.0000 g | INTRAVENOUS | Status: AC
  Administered 2014-06-28: 2 g via INTRAVENOUS
  Filled 2014-06-27: qty 50

## 2014-06-28 ENCOUNTER — Encounter (HOSPITAL_COMMUNITY): Payer: 59 | Admitting: Anesthesiology

## 2014-06-28 ENCOUNTER — Inpatient Hospital Stay (HOSPITAL_COMMUNITY)
Admission: RE | Admit: 2014-06-28 | Discharge: 2014-07-01 | DRG: 469 | Disposition: A | Discharge status: Home Health | Payer: 59 | Source: Ambulatory Visit | Attending: Orthopedic Surgery | Admitting: Orthopedic Surgery

## 2014-06-28 ENCOUNTER — Encounter (HOSPITAL_COMMUNITY)
Admission: RE | Disposition: A | Discharge status: Home Health | Payer: Self-pay | Source: Ambulatory Visit | Attending: Orthopedic Surgery

## 2014-06-28 ENCOUNTER — Inpatient Hospital Stay (HOSPITAL_COMMUNITY): Payer: 59

## 2014-06-28 ENCOUNTER — Inpatient Hospital Stay (HOSPITAL_COMMUNITY): Payer: 59 | Admitting: Anesthesiology

## 2014-06-28 DIAGNOSIS — E7800 Pure hypercholesterolemia, unspecified: Secondary | ICD-10-CM | POA: Diagnosis present

## 2014-06-28 DIAGNOSIS — Z79899 Other long term (current) drug therapy: Secondary | ICD-10-CM

## 2014-06-28 DIAGNOSIS — G92 Toxic encephalopathy: Secondary | ICD-10-CM | POA: Diagnosis not present

## 2014-06-28 DIAGNOSIS — M1612 Unilateral primary osteoarthritis, left hip: Principal | ICD-10-CM | POA: Diagnosis present

## 2014-06-28 DIAGNOSIS — T50995A Adverse effect of other drugs, medicaments and biological substances, initial encounter: Secondary | ICD-10-CM | POA: Diagnosis not present

## 2014-06-28 DIAGNOSIS — I10 Essential (primary) hypertension: Secondary | ICD-10-CM | POA: Diagnosis present

## 2014-06-28 DIAGNOSIS — M47819 Spondylosis without myelopathy or radiculopathy, site unspecified: Secondary | ICD-10-CM | POA: Diagnosis present

## 2014-06-28 DIAGNOSIS — F32A Depression, unspecified: Secondary | ICD-10-CM | POA: Diagnosis present

## 2014-06-28 DIAGNOSIS — R401 Stupor: Secondary | ICD-10-CM

## 2014-06-28 DIAGNOSIS — M1610 Unilateral primary osteoarthritis, unspecified hip: Secondary | ICD-10-CM | POA: Diagnosis present

## 2014-06-28 DIAGNOSIS — Y92230 Patient room in hospital as the place of occurrence of the external cause: Secondary | ICD-10-CM

## 2014-06-28 DIAGNOSIS — D62 Acute posthemorrhagic anemia: Secondary | ICD-10-CM | POA: Diagnosis not present

## 2014-06-28 DIAGNOSIS — E222 Syndrome of inappropriate secretion of antidiuretic hormone: Secondary | ICD-10-CM | POA: Diagnosis not present

## 2014-06-28 DIAGNOSIS — F329 Major depressive disorder, single episode, unspecified: Secondary | ICD-10-CM | POA: Diagnosis present

## 2014-06-28 DIAGNOSIS — K5669 Other intestinal obstruction: Secondary | ICD-10-CM | POA: Diagnosis not present

## 2014-06-28 DIAGNOSIS — R112 Nausea with vomiting, unspecified: Secondary | ICD-10-CM

## 2014-06-28 DIAGNOSIS — G4700 Insomnia, unspecified: Secondary | ICD-10-CM | POA: Diagnosis present

## 2014-06-28 DIAGNOSIS — Z7901 Long term (current) use of anticoagulants: Secondary | ICD-10-CM

## 2014-06-28 DIAGNOSIS — E785 Hyperlipidemia, unspecified: Secondary | ICD-10-CM | POA: Diagnosis present

## 2014-06-28 DIAGNOSIS — M25552 Pain in left hip: Secondary | ICD-10-CM | POA: Diagnosis present

## 2014-06-28 HISTORY — PX: TOTAL HIP ARTHROPLASTY: SHX124

## 2014-06-28 SURGERY — ARTHROPLASTY, HIP, TOTAL,POSTERIOR APPROACH
Anesthesia: General | Site: Hip | Laterality: Left

## 2014-06-28 MED ORDER — HYDROXYZINE PAMOATE 25 MG PO CAPS: 25.0000 mg | ORAL_CAPSULE | Freq: Every day | ORAL | Status: DC

## 2014-06-28 MED ORDER — LIDOCAINE HCL (CARDIAC) 20 MG/ML IV SOLN
INTRAVENOUS | Status: AC
  Filled 2014-06-28: qty 5

## 2014-06-28 MED ORDER — NEOSTIGMINE METHYLSULFATE 10 MG/10ML IV SOLN
INTRAVENOUS | Status: DC | PRN
  Administered 2014-06-28: 3 mg via INTRAVENOUS

## 2014-06-28 MED ORDER — MELATONIN 5 MG PO CAPS: 5.0000 mg | ORAL_CAPSULE | Freq: Every day | ORAL | Status: DC

## 2014-06-28 MED ORDER — OXYCODONE HCL 5 MG PO TABS
5.0000 mg | ORAL_TABLET | ORAL | Status: DC | PRN
  Administered 2014-06-28 (×3): 10 mg via ORAL
  Filled 2014-06-28 (×3): qty 2

## 2014-06-28 MED ORDER — DIPHENHYDRAMINE HCL 50 MG/ML IJ SOLN
10.0000 mg | Freq: Once | INTRAMUSCULAR | Status: AC
  Administered 2014-06-28: 12.5 mg via INTRAVENOUS

## 2014-06-28 MED ORDER — LORATADINE 10 MG PO TABS
10.0000 mg | ORAL_TABLET | Freq: Every day | ORAL | Status: DC
  Administered 2014-06-30 – 2014-07-01 (×2): 10 mg via ORAL
  Filled 2014-06-28 (×3): qty 1

## 2014-06-28 MED ORDER — HYDROMORPHONE HCL 1 MG/ML IJ SOLN
INTRAMUSCULAR | Status: AC
  Administered 2014-06-28: 13:00:00
  Filled 2014-06-28: qty 1

## 2014-06-28 MED ORDER — DEXAMETHASONE SODIUM PHOSPHATE 10 MG/ML IJ SOLN
INTRAMUSCULAR | Status: DC | PRN
  Administered 2014-06-28: 10 mg via INTRAVENOUS

## 2014-06-28 MED ORDER — ACETAMINOPHEN 325 MG PO TABS: 650.0000 mg | ORAL_TABLET | Freq: Four times a day (QID) | ORAL | Status: DC | PRN

## 2014-06-28 MED ORDER — HYDROMORPHONE HCL 1 MG/ML IJ SOLN
0.2500 mg | INTRAMUSCULAR | Status: DC | PRN
  Administered 2014-06-28 (×2): 0.5 mg via INTRAVENOUS

## 2014-06-28 MED ORDER — VITAMIN D3 25 MCG (1000 UNIT) PO TABS
1000.0000 [IU] | ORAL_TABLET | Freq: Two times a day (BID) | ORAL | Status: DC
  Administered 2014-06-29 – 2014-07-01 (×4): 1000 [IU] via ORAL
  Filled 2014-06-28 (×7): qty 1

## 2014-06-28 MED ORDER — BUPIVACAINE-EPINEPHRINE (PF) 0.5% -1:200000 IJ SOLN
INTRAMUSCULAR | Status: AC
  Filled 2014-06-28: qty 30

## 2014-06-28 MED ORDER — HYDROMORPHONE HCL 1 MG/ML IJ SOLN
0.2500 mg | INTRAMUSCULAR | Status: DC | PRN
  Administered 2014-06-28 (×4): 0.5 mg via INTRAVENOUS

## 2014-06-28 MED ORDER — DIPHENHYDRAMINE HCL 50 MG/ML IJ SOLN
INTRAMUSCULAR | Status: AC
  Filled 2014-06-28: qty 1

## 2014-06-28 MED ORDER — MIDAZOLAM HCL 2 MG/2ML IJ SOLN
INTRAMUSCULAR | Status: AC
  Filled 2014-06-28: qty 2

## 2014-06-28 MED ORDER — ROCURONIUM BROMIDE 100 MG/10ML IV SOLN
INTRAVENOUS | Status: DC | PRN
  Administered 2014-06-28: 50 mg via INTRAVENOUS

## 2014-06-28 MED ORDER — BISACODYL 5 MG PO TBEC: 5.0000 mg | DELAYED_RELEASE_TABLET | Freq: Every day | ORAL | Status: DC | PRN

## 2014-06-28 MED ORDER — FENTANYL CITRATE 0.05 MG/ML IJ SOLN
INTRAMUSCULAR | Status: AC
  Filled 2014-06-28: qty 5

## 2014-06-28 MED ORDER — LACTATED RINGERS IV SOLN
INTRAVENOUS | Status: DC | PRN
  Administered 2014-06-28 (×2): via INTRAVENOUS

## 2014-06-28 MED ORDER — ONDANSETRON HCL 4 MG/2ML IJ SOLN
INTRAMUSCULAR | Status: AC
  Filled 2014-06-28: qty 2

## 2014-06-28 MED ORDER — FLEET ENEMA 7-19 GM/118ML RE ENEM: 1.0000 | ENEMA | Freq: Once | RECTAL | Status: AC | PRN

## 2014-06-28 MED ORDER — MIDAZOLAM HCL 2 MG/2ML IJ SOLN: 0.5000 mg | Freq: Once | INTRAMUSCULAR | Status: DC | PRN

## 2014-06-28 MED ORDER — VENLAFAXINE HCL ER 150 MG PO CP24
150.0000 mg | ORAL_CAPSULE | Freq: Every day | ORAL | Status: DC
  Administered 2014-06-28 – 2014-06-30 (×3): 150 mg via ORAL
  Filled 2014-06-28 (×4): qty 1

## 2014-06-28 MED ORDER — ACETAMINOPHEN 650 MG RE SUPP: 650.0000 mg | Freq: Four times a day (QID) | RECTAL | Status: DC | PRN

## 2014-06-28 MED ORDER — DIPHENHYDRAMINE HCL 12.5 MG/5ML PO ELIX
12.5000 mg | ORAL_SOLUTION | ORAL | Status: DC | PRN
  Administered 2014-06-29: 25 mg via ORAL
  Filled 2014-06-28: qty 10

## 2014-06-28 MED ORDER — SODIUM CHLORIDE 0.9 % IV SOLN
1000.0000 mg | INTRAVENOUS | Status: AC
  Administered 2014-06-28: 1000 mg via INTRAVENOUS
  Filled 2014-06-28: qty 10

## 2014-06-28 MED ORDER — SCOPOLAMINE 1 MG/3DAYS TD PT72
MEDICATED_PATCH | TRANSDERMAL | Status: AC
  Filled 2014-06-28: qty 1

## 2014-06-28 MED ORDER — METHOCARBAMOL 500 MG PO TABS
500.0000 mg | ORAL_TABLET | Freq: Once | ORAL | Status: AC
  Administered 2014-06-28: 500 mg via ORAL

## 2014-06-28 MED ORDER — RIVAROXABAN 10 MG PO TABS
10.0000 mg | ORAL_TABLET | Freq: Every day | ORAL | Status: DC
  Administered 2014-06-29 – 2014-07-01 (×3): 10 mg via ORAL
  Filled 2014-06-28 (×5): qty 1

## 2014-06-28 MED ORDER — GLYCOPYRROLATE 0.2 MG/ML IJ SOLN
INTRAMUSCULAR | Status: DC | PRN
  Administered 2014-06-28: .4 mg via INTRAVENOUS

## 2014-06-28 MED ORDER — HYDROXYZINE PAMOATE 25 MG PO CAPS
25.0000 mg | ORAL_CAPSULE | Freq: Every day | ORAL | Status: DC
  Filled 2014-06-28: qty 1

## 2014-06-28 MED ORDER — FENTANYL CITRATE 0.05 MG/ML IJ SOLN
INTRAMUSCULAR | Status: DC | PRN
  Administered 2014-06-28: 50 ug via INTRAVENOUS
  Administered 2014-06-28: 25 ug via INTRAVENOUS
  Administered 2014-06-28: 50 ug via INTRAVENOUS
  Administered 2014-06-28: 25 ug via INTRAVENOUS
  Administered 2014-06-28: 150 ug via INTRAVENOUS
  Administered 2014-06-28: 100 ug via INTRAVENOUS
  Administered 2014-06-28: 25 ug via INTRAVENOUS
  Administered 2014-06-28: 50 ug via INTRAVENOUS
  Administered 2014-06-28: 25 ug via INTRAVENOUS

## 2014-06-28 MED ORDER — PHENOL 1.4 % MT LIQD: 1.0000 | OROMUCOSAL | Status: DC | PRN

## 2014-06-28 MED ORDER — CELECOXIB 200 MG PO CAPS
200.0000 mg | ORAL_CAPSULE | Freq: Two times a day (BID) | ORAL | Status: DC
  Administered 2014-06-28 – 2014-07-01 (×6): 200 mg via ORAL
  Filled 2014-06-28 (×7): qty 1

## 2014-06-28 MED ORDER — FOLIC ACID 1 MG PO TABS
1.0000 mg | ORAL_TABLET | Freq: Every day | ORAL | Status: DC
  Administered 2014-06-29 – 2014-07-01 (×3): 1 mg via ORAL
  Filled 2014-06-28 (×3): qty 1

## 2014-06-28 MED ORDER — METHOCARBAMOL 500 MG PO TABS
500.0000 mg | ORAL_TABLET | Freq: Four times a day (QID) | ORAL | Status: DC | PRN
  Administered 2014-06-28 – 2014-06-29 (×3): 500 mg via ORAL
  Filled 2014-06-28 (×3): qty 1

## 2014-06-28 MED ORDER — HYDROXYZINE HCL 25 MG PO TABS
25.0000 mg | ORAL_TABLET | Freq: Every day | ORAL | Status: DC
  Administered 2014-06-28: 25 mg via ORAL
  Filled 2014-06-28 (×2): qty 1

## 2014-06-28 MED ORDER — PROPOFOL 10 MG/ML IV BOLUS
INTRAVENOUS | Status: DC | PRN
  Administered 2014-06-28: 120 mg via INTRAVENOUS

## 2014-06-28 MED ORDER — OXYCODONE HCL 5 MG PO TABS
5.0000 mg | ORAL_TABLET | Freq: Once | ORAL | Status: AC | PRN
Start: 2014-06-28 — End: 2014-06-28
  Administered 2014-06-28: 5 mg via ORAL

## 2014-06-28 MED ORDER — ALUMINUM HYDROXIDE GEL 320 MG/5ML PO SUSP
15.0000 mL | ORAL | Status: DC | PRN
Start: 2014-06-28 — End: 2014-07-01
  Filled 2014-06-28: qty 30

## 2014-06-28 MED ORDER — METOCLOPRAMIDE HCL 5 MG/ML IJ SOLN
5.0000 mg | Freq: Three times a day (TID) | INTRAMUSCULAR | Status: DC | PRN
  Administered 2014-06-28 – 2014-06-29 (×2): 10 mg via INTRAVENOUS
  Filled 2014-06-28 (×2): qty 2

## 2014-06-28 MED ORDER — HYDROMORPHONE HCL 1 MG/ML IJ SOLN
INTRAMUSCULAR | Status: AC
  Administered 2014-06-28: 15:00:00
  Filled 2014-06-28: qty 1

## 2014-06-28 MED ORDER — OXYCODONE HCL 5 MG/5ML PO SOLN: 5.0000 mg | Freq: Once | ORAL | Status: AC | PRN

## 2014-06-28 MED ORDER — PROMETHAZINE HCL 25 MG/ML IJ SOLN: 6.2500 mg | INTRAMUSCULAR | Status: DC | PRN

## 2014-06-28 MED ORDER — ONDANSETRON HCL 4 MG/2ML IJ SOLN
4.0000 mg | Freq: Four times a day (QID) | INTRAMUSCULAR | Status: DC | PRN
  Administered 2014-06-28 – 2014-06-30 (×4): 4 mg via INTRAVENOUS
  Filled 2014-06-28 (×5): qty 2

## 2014-06-28 MED ORDER — MIDAZOLAM HCL 5 MG/5ML IJ SOLN
INTRAMUSCULAR | Status: DC | PRN
  Administered 2014-06-28 (×2): 1 mg via INTRAVENOUS
  Administered 2014-06-28: 2 mg via INTRAVENOUS

## 2014-06-28 MED ORDER — SODIUM CHLORIDE 0.9 % IR SOLN
Status: DC | PRN
  Administered 2014-06-28: 1000 mL

## 2014-06-28 MED ORDER — METHOCARBAMOL 500 MG PO TABS
ORAL_TABLET | ORAL | Status: AC
  Administered 2014-06-28: 13:00:00
  Filled 2014-06-28: qty 1

## 2014-06-28 MED ORDER — SENNOSIDES-DOCUSATE SODIUM 8.6-50 MG PO TABS: 1.0000 | ORAL_TABLET | Freq: Every evening | ORAL | Status: DC | PRN

## 2014-06-28 MED ORDER — CIPROFLOXACIN HCL 250 MG PO TABS
250.0000 mg | ORAL_TABLET | Freq: Every day | ORAL | Status: DC
  Administered 2014-06-29 – 2014-07-01 (×3): 250 mg via ORAL
  Filled 2014-06-28 (×4): qty 1

## 2014-06-28 MED ORDER — MEPERIDINE HCL 25 MG/ML IJ SOLN: 6.2500 mg | INTRAMUSCULAR | Status: DC | PRN

## 2014-06-28 MED ORDER — SCOPOLAMINE 1 MG/3DAYS TD PT72
1.0000 | MEDICATED_PATCH | Freq: Once | TRANSDERMAL | Status: AC
  Administered 2014-06-28: 1 via TRANSDERMAL

## 2014-06-28 MED ORDER — KCL IN DEXTROSE-NACL 20-5-0.45 MEQ/L-%-% IV SOLN
INTRAVENOUS | Status: DC
  Administered 2014-06-28: 22:00:00 via INTRAVENOUS
  Filled 2014-06-28 (×5): qty 1000

## 2014-06-28 MED ORDER — ONDANSETRON HCL 4 MG PO TABS: 4.0000 mg | ORAL_TABLET | Freq: Four times a day (QID) | ORAL | Status: DC | PRN

## 2014-06-28 MED ORDER — LACTATED RINGERS IV SOLN
INTRAVENOUS | Status: DC
  Administered 2014-06-28: 09:00:00 via INTRAVENOUS

## 2014-06-28 MED ORDER — DEXAMETHASONE SODIUM PHOSPHATE 10 MG/ML IJ SOLN
INTRAMUSCULAR | Status: AC
  Filled 2014-06-28: qty 1

## 2014-06-28 MED ORDER — DOCUSATE SODIUM 100 MG PO CAPS
100.0000 mg | ORAL_CAPSULE | Freq: Two times a day (BID) | ORAL | Status: DC
  Administered 2014-06-28: 100 mg via ORAL
  Filled 2014-06-28 (×3): qty 1

## 2014-06-28 MED ORDER — OXYCODONE HCL 5 MG PO TABS
ORAL_TABLET | ORAL | Status: AC
  Administered 2014-06-28: 13:00:00
  Filled 2014-06-28: qty 1

## 2014-06-28 MED ORDER — ONDANSETRON HCL 4 MG/2ML IJ SOLN
INTRAMUSCULAR | Status: DC | PRN
  Administered 2014-06-28: 4 mg via INTRAVENOUS

## 2014-06-28 MED ORDER — PROPOFOL 10 MG/ML IV BOLUS
INTRAVENOUS | Status: AC
  Filled 2014-06-28: qty 20

## 2014-06-28 MED ORDER — GLYCOPYRROLATE 0.2 MG/ML IJ SOLN
INTRAMUSCULAR | Status: AC
  Filled 2014-06-28: qty 2

## 2014-06-28 MED ORDER — ESTROGENS, CONJUGATED 0.625 MG/GM VA CREA
1.0000 | TOPICAL_CREAM | VAGINAL | Status: DC
  Filled 2014-06-28: qty 42.5

## 2014-06-28 MED ORDER — LIDOCAINE HCL (CARDIAC) 20 MG/ML IV SOLN
INTRAVENOUS | Status: DC | PRN
  Administered 2014-06-28: 30 mg via INTRAVENOUS

## 2014-06-28 MED ORDER — BUPIVACAINE-EPINEPHRINE 0.5% -1:200000 IJ SOLN
INTRAMUSCULAR | Status: DC | PRN
Start: 2014-06-28 — End: 2014-06-28
  Administered 2014-06-28: 10 mL

## 2014-06-28 MED ORDER — HYDROMORPHONE HCL 1 MG/ML IJ SOLN
0.5000 mg | INTRAMUSCULAR | Status: DC | PRN
  Administered 2014-06-28 – 2014-06-29 (×3): 1 mg via INTRAVENOUS
  Filled 2014-06-28 (×3): qty 1

## 2014-06-28 MED ORDER — METOCLOPRAMIDE HCL 5 MG PO TABS
5.0000 mg | ORAL_TABLET | Freq: Three times a day (TID) | ORAL | Status: DC | PRN
  Filled 2014-06-28: qty 2

## 2014-06-28 MED ORDER — NEOSTIGMINE METHYLSULFATE 10 MG/10ML IV SOLN
INTRAVENOUS | Status: AC
  Filled 2014-06-28: qty 1

## 2014-06-28 MED ORDER — MENTHOL 3 MG MT LOZG: 1.0000 | LOZENGE | OROMUCOSAL | Status: DC | PRN

## 2014-06-28 MED ORDER — PRAVASTATIN SODIUM 40 MG PO TABS
40.0000 mg | ORAL_TABLET | Freq: Every day | ORAL | Status: DC
  Administered 2014-06-28 – 2014-06-30 (×2): 40 mg via ORAL
  Filled 2014-06-28 (×4): qty 1

## 2014-06-28 MED ORDER — METHOCARBAMOL 1000 MG/10ML IJ SOLN
500.0000 mg | Freq: Four times a day (QID) | INTRAMUSCULAR | Status: DC | PRN
  Filled 2014-06-28: qty 5

## 2014-06-28 MED ORDER — BUPIVACAINE-EPINEPHRINE (PF) 0.25% -1:200000 IJ SOLN
INTRAMUSCULAR | Status: AC
  Filled 2014-06-28: qty 30

## 2014-06-28 SURGICAL SUPPLY — 54 items
BLADE SAW SGTL 18X1.27X75 (BLADE) ×2 IMPLANT
BLADE SAW SGTL 18X1.27X75MM (BLADE) ×1
BRUSH FEMORAL CANAL (MISCELLANEOUS) IMPLANT
CAPT HIP PF COP ×2 IMPLANT
COVER BACK TABLE 24X17X13 BIG (DRAPES) ×2 IMPLANT
COVER SURGICAL LIGHT HANDLE (MISCELLANEOUS) ×6 IMPLANT
DECANTER SPIKE VIAL GLASS SM (MISCELLANEOUS) ×3 IMPLANT
DRAPE ORTHO SPLIT 77X108 STRL (DRAPES) ×3
DRAPE PROXIMA HALF (DRAPES) ×3 IMPLANT
DRAPE SURG ORHT 6 SPLT 77X108 (DRAPES) ×1 IMPLANT
DRAPE U-SHAPE 47X51 STRL (DRAPES) ×3 IMPLANT
DRILL BIT 7/64X5 (BIT) ×3 IMPLANT
DRSG AQUACEL AG ADV 3.5X10 (GAUZE/BANDAGES/DRESSINGS) ×3 IMPLANT
DURAPREP 26ML APPLICATOR (WOUND CARE) ×3 IMPLANT
ELECT BLADE 4.0 EZ CLEAN MEGAD (MISCELLANEOUS)
ELECT REM PT RETURN 9FT ADLT (ELECTROSURGICAL) ×3
ELECTRODE BLDE 4.0 EZ CLN MEGD (MISCELLANEOUS) IMPLANT
ELECTRODE REM PT RTRN 9FT ADLT (ELECTROSURGICAL) ×1 IMPLANT
GAUZE XEROFORM 1X8 LF (GAUZE/BANDAGES/DRESSINGS) ×3 IMPLANT
GLOVE BIO SURGEON STRL SZ7.5 (GLOVE) ×5 IMPLANT
GLOVE BIO SURGEON STRL SZ8.5 (GLOVE) ×6 IMPLANT
GLOVE BIOGEL PI IND STRL 8 (GLOVE) ×2 IMPLANT
GLOVE BIOGEL PI IND STRL 9 (GLOVE) ×1 IMPLANT
GLOVE BIOGEL PI INDICATOR 8 (GLOVE) ×6
GLOVE BIOGEL PI INDICATOR 9 (GLOVE) ×2
GOWN STRL REUS W/ TWL LRG LVL3 (GOWN DISPOSABLE) ×2 IMPLANT
GOWN STRL REUS W/ TWL XL LVL3 (GOWN DISPOSABLE) ×3 IMPLANT
GOWN STRL REUS W/TWL LRG LVL3 (GOWN DISPOSABLE) ×6
GOWN STRL REUS W/TWL XL LVL3 (GOWN DISPOSABLE) ×9
HANDPIECE INTERPULSE COAX TIP (DISPOSABLE)
HOOD PEEL AWAY FACE SHEILD DIS (HOOD) ×6 IMPLANT
KIT BASIN OR (CUSTOM PROCEDURE TRAY) ×3 IMPLANT
KIT ROOM TURNOVER OR (KITS) ×3 IMPLANT
MANIFOLD NEPTUNE II (INSTRUMENTS) ×3 IMPLANT
NEEDLE 22X1 1/2 (OR ONLY) (NEEDLE) ×3 IMPLANT
NS IRRIG 1000ML POUR BTL (IV SOLUTION) ×3 IMPLANT
PACK TOTAL JOINT (CUSTOM PROCEDURE TRAY) ×3 IMPLANT
PAD ARMBOARD 7.5X6 YLW CONV (MISCELLANEOUS) ×6 IMPLANT
PASSER SUT SWANSON 36MM LOOP (INSTRUMENTS) ×3 IMPLANT
PRESSURIZER FEMORAL UNIV (MISCELLANEOUS) IMPLANT
SET HNDPC FAN SPRY TIP SCT (DISPOSABLE) IMPLANT
SUT ETHIBOND 2 V 37 (SUTURE) ×3 IMPLANT
SUT VIC AB 0 CTB1 27 (SUTURE) ×3 IMPLANT
SUT VIC AB 1 CTX 36 (SUTURE) ×3
SUT VIC AB 1 CTX36XBRD ANBCTR (SUTURE) ×1 IMPLANT
SUT VIC AB 2-0 CTB1 (SUTURE) ×3 IMPLANT
SUT VIC AB 3-0 SH 27 (SUTURE) ×3
SUT VIC AB 3-0 SH 27X BRD (SUTURE) ×1 IMPLANT
SYR CONTROL 10ML LL (SYRINGE) ×3 IMPLANT
TOWEL OR 17X24 6PK STRL BLUE (TOWEL DISPOSABLE) ×3 IMPLANT
TOWEL OR 17X26 10 PK STRL BLUE (TOWEL DISPOSABLE) ×3 IMPLANT
TOWER CARTRIDGE SMART MIX (DISPOSABLE) IMPLANT
TRAY FOLEY CATH 14FR (SET/KITS/TRAYS/PACK) IMPLANT
WATER STERILE IRR 1000ML POUR (IV SOLUTION) ×12 IMPLANT

## 2014-06-28 NOTE — Op Note (Signed)
OPERATIVE REPORT    DATE OF PROCEDURE:  06/28/2014       PREOPERATIVE DIAGNOSIS:  LEFT HIP OSTEOARTHRITIS                                                          POSTOPERATIVE DIAGNOSIS:  LEFT HIP OSTEOARTHRITIS                                                           PROCEDURE:  L total hip arthroplasty using a 52 mm DePuy Pinnacle  Cup, [employer], 10-degree polyethylene liner index superior  and posterior, a +0 36 mm ceramic head, a 18x13x42x150 SROM stem, 18Dsm Sleeve   SURGEON: Clancey Welton J    ASSISTANT:   [employer]  (present throughout entire procedure and necessary for timely completion of the procedure)   ANESTHESIA: General BLOOD LOSS: 400 FLUID REPLACEMENT: 1800 crystalloid DRAINS: Foley Catheter URINE OUTPUT: 354SF COMPLICATIONS: none    INDICATIONS FOR PROCEDURE: A 53 y.o. year-old With  LEFT HIP OSTEOARTHRITIS   for 3 years, x-rays show bone-on-bone arthritic changes. Despite conservative measures with observation, anti-inflammatory medicine, narcotics, use of a cane, has severe unremitting pain and can ambulate only a few blocks before resting.  Patient desires elective L total hip arthroplasty to decrease pain and increase function. The risks, benefits, and alternatives were discussed at length including but not limited to the risks of infection, bleeding, nerve injury, stiffness, blood clots, the need for revision surgery, cardiopulmonary complications, among others, and they were willing to proceed. Questions answered     PROCEDURE IN DETAIL: The patient was identified by armband,  received preoperative IV antibiotics in the holding area at [facility], taken to the operating room , appropriate anesthetic monitors  were attached and general endotracheal anesthesia induced. Foley catheter was inserted. Pt was rolled into the R lateral decubitus position and fixed there with a Stulberg Mark II pelvic clamp.  The L lower extremity was  then prepped and draped  in the usual sterile fashion from the ankle to the hemipelvis. A time-out  procedure was performed. The skin along the lateral hip and thigh  infiltrated with 10 mL of 0.5% Marcaine and epinephrine solution. We  then made a posterolateral approach to the hip. With a #10 blade, a 15 cm  incision was made through the skin and subcutaneous tissue down to the level of the  IT band. Small bleeders were identified and cauterized. The IT band was cut in  line with skin incision exposing the greater trochanter. A Cobra retractor was placed between the gluteus minimus and the superior hip joint capsule, and a spiked Cobra between the quadratus femoris and the inferior hip joint capsule. This isolated the short  external rotators and piriformis tendons. These were tagged with a #2 Ethibond  suture and cut off their insertion on the intertrochanteric crest. The posterior  capsule was then developed into an acetabular-based flap from Posterior Superior off of the acetabulum out over the femoral neck and back posterior inferior to the acetabular rim. This flap was tagged with two #2 Ethibond  sutures and retracted protecting the sciatic nerve. This exposed the arthritic femoral head and osteophytes. The hip was then flexed and internally rotated, dislocating the femoral head and a standard neck cut performed 1 fingerbreadth above the lesser trochanter.  A spiked Cobra was placed in the cotyloid notch and a Hohmann retractor was then used to lever the femur anteriorly off of the anterior pelvic column. A posterior-inferior wing retractor was placed at the junction of the acetabulum and the ischium completing the acetabular exposure.We then removed the peripheral osteophytes and labrum from the acetabulum. We then reamed the acetabulum up to 51 mm with basket reamers obtaining good coverage in all quadrants. We then irrigated with normal  saline solution and hammered into place a 52 mm pinnacle  cup in 45  degrees of abduction and about 20 degrees of anteversion. More  peripheral osteophytes removed and a trial 10-degree liner placed with the  index superior-posterior. The hip was then flexed and internally rotated exposing the  proximal femur, which was entered with the initiating reamer followed by  the axial reamers up to a 13.5 mm full depth and 62mm partial depth. We then conically reamed to 18D to the correct depth for a 42 base neck. The calcar was milled to 18Dsm. A trial cone and stem was inserted in the 25 degrees anteversion, with a +0 62mm trial head. Trial reduction was then performed and excellent stability was noted with at 90 of flexion with 75 of internal rotation and then full extension with maximal external rotation. The hip could not be dislocated in full extension. The knee could easily flex  to about 130 degrees. We also stretched the abductors at this point,  because of the preexisting adductor contractures. All trial components  were then removed. The acetabulum was irrigated out with normal saline  solution. A titanium [facility] was then screwed into place  followed by a 10-degree polyethylene liner index superior-posterior. On  the femoral side a 18Dsm ZTT1 sleeve was hammered into place, followed by a 18x13x42x150 SROM stem in 25 degrees of anteversion. At this point, a +0 36 mm ceramic head was  hammered on the stem. The hip was reduced. We checked our stability  one more time and found it to be excellent. The wound was once again  thoroughly irrigated out with normal saline solution pulse lavage. The  capsular flap and short external rotators were repaired back to the  intertrochanteric crest through drill holes with a #2 Ethibond suture.  The IT band was closed with running 1 Vicryl suture. The subcutaneous  tissue with 0 and 2-0 undyed Vicryl suture and the skin with running  interlocking 3-0 nylon suture. Dressing of Xeroform and Mepilex was   then applied. The patient was then unclamped, rolled supine, awaken extubated and taken to recovery room without difficulty in stable condition.   Deakon Frix J 06/28/2014, 11:12 AM

## 2014-06-28 NOTE — Interval H&P Note (Signed)
History and Physical Interval Note:  06/28/2014 9:08 AM  Adrienne Campbell  has presented today for surgery, with the diagnosis of LEFT HIP OSTEOARTHRITIS  The various methods of treatment have been discussed with the patient and family. After consideration of risks, benefits and other options for treatment, the patient has consented to  Procedure(s): LEFT TOTAL HIP ARTHROPLASTY (Left) as a surgical intervention .  The patient's history has been reviewed, patient examined, no change in status, stable for surgery.  I have reviewed the patient's chart and labs.  Questions were answered to the patient's satisfaction.     Kerin Salen

## 2014-06-28 NOTE — Progress Notes (Signed)
Utilization review completed.  

## 2014-06-28 NOTE — Transfer of Care (Signed)
Immediate Anesthesia Transfer of Care Note  Patient: Adrienne Campbell  Procedure(s) Performed: Procedure(s): LEFT TOTAL HIP ARTHROPLASTY (Left)  Patient Location: PACU  Anesthesia Type:General  Level of Consciousness: awake and alert   Airway & Oxygen Therapy: Patient Spontanous Breathing and Patient connected to nasal cannula oxygen  Post-op Assessment: Report given to PACU RN and Post -op Vital signs reviewed and stable  Post vital signs: Reviewed and stable  Complications: No apparent anesthesia complications

## 2014-06-28 NOTE — Anesthesia Preprocedure Evaluation (Addendum)
Anesthesia Evaluation  Patient identified by MRN, date of birth, ID band Patient awake    Reviewed: Allergy & Precautions, H&P , NPO status , Patient's Chart, lab work & pertinent test results  History of Anesthesia Complications Negative for: history of anesthetic complications  Airway Mallampati: II  TM Distance: >3 FB Neck ROM: Full    Dental  (+) Teeth Intact, Dental Advisory Given   Pulmonary neg pulmonary ROS,  breath sounds clear to auscultation        Cardiovascular hypertension (borderline, no meds), - anginaRhythm:Regular Rate:Normal     Neuro/Psych Depression negative neurological ROS     GI/Hepatic negative GI ROS, Neg liver ROS,   Endo/Other  negative endocrine ROS  Renal/GU negative Renal ROS     Musculoskeletal  (+) Arthritis -, Osteoarthritis,    Abdominal   Peds  Hematology   Anesthesia Other Findings   Reproductive/Obstetrics                            Anesthesia Physical Anesthesia Plan  ASA: II  Anesthesia Plan: General   Post-op Pain Management:    Induction: Intravenous  Airway Management Planned: Oral ETT  Additional Equipment:   Intra-op Plan:   Post-operative Plan: Extubation in OR  Informed Consent: I have reviewed the patients History and Physical, chart, labs and discussed the procedure including the risks, benefits and alternatives for the proposed anesthesia with the patient or authorized representative who has indicated his/her understanding and acceptance.   Dental advisory given  Plan Discussed with: CRNA and Surgeon  Anesthesia Plan Comments: (Plan routine monitors, GETA)        Anesthesia Quick Evaluation

## 2014-06-28 NOTE — Anesthesia Postprocedure Evaluation (Signed)
  Anesthesia Post-op Note  Patient: Adrienne Campbell  Procedure(s) Performed: Procedure(s): LEFT TOTAL HIP ARTHROPLASTY (Left)  Patient Location: PACU  Anesthesia Type:General  Level of Consciousness: awake, alert , oriented and patient cooperative  Airway and Oxygen Therapy: Patient Spontanous Breathing and Patient connected to nasal cannula oxygen  Post-op Pain: mild  Post-op Assessment: Post-op Vital signs reviewed, Patient's Cardiovascular Status Stable, Respiratory Function Stable, Patent Airway, No signs of Nausea or vomiting and Pain level controlled  Post-op Vital Signs: Reviewed and stable  Last Vitals:  Filed Vitals:   06/28/14 1438  BP: 119/76  Pulse: 87  Temp:   Resp: 11    Complications: No apparent anesthesia complications

## 2014-06-28 NOTE — Progress Notes (Signed)
Pt appears to be sleeping well. Still rates pain at "9" when aroused then quickly goes back to sleep.  Will let pt rest for now. Ice to hip. Will cont to monitor.

## 2014-06-29 ENCOUNTER — Encounter (HOSPITAL_COMMUNITY): Payer: Self-pay | Admitting: Orthopedic Surgery

## 2014-06-29 ENCOUNTER — Inpatient Hospital Stay (HOSPITAL_COMMUNITY): Payer: 59

## 2014-06-29 DIAGNOSIS — R401 Stupor: Secondary | ICD-10-CM

## 2014-06-29 DIAGNOSIS — E780 Pure hypercholesterolemia: Secondary | ICD-10-CM

## 2014-06-29 DIAGNOSIS — F329 Major depressive disorder, single episode, unspecified: Secondary | ICD-10-CM

## 2014-06-29 DIAGNOSIS — M47819 Spondylosis without myelopathy or radiculopathy, site unspecified: Secondary | ICD-10-CM

## 2014-06-29 DIAGNOSIS — M1990 Unspecified osteoarthritis, unspecified site: Secondary | ICD-10-CM

## 2014-06-29 LAB — BLOOD GAS, ARTERIAL
Acid-base deficit: 1.1 mmol/L (ref 0.0–2.0)
Bicarbonate: 22.3 mEq/L (ref 20.0–24.0)
DRAWN BY: 24485
O2 Saturation: 94.4 %
PCO2 ART: 32.4 mmHg — AB (ref 35.0–45.0)
PO2 ART: 67.9 mmHg — AB (ref 80.0–100.0)
Patient temperature: 98.6
TCO2: 23.3 mmol/L (ref 0–100)
pH, Arterial: 7.453 — ABNORMAL HIGH (ref 7.350–7.450)

## 2014-06-29 LAB — AMMONIA: AMMONIA: 10 umol/L — AB (ref 11–60)

## 2014-06-29 LAB — URINALYSIS, ROUTINE W REFLEX MICROSCOPIC
Bilirubin Urine: NEGATIVE
GLUCOSE, UA: NEGATIVE mg/dL
Hgb urine dipstick: NEGATIVE
KETONES UR: NEGATIVE mg/dL
Leukocytes, UA: NEGATIVE
Nitrite: NEGATIVE
PH: 6 (ref 5.0–8.0)
Protein, ur: NEGATIVE mg/dL
SPECIFIC GRAVITY, URINE: 1.022 (ref 1.005–1.030)
Urobilinogen, UA: 0.2 mg/dL (ref 0.0–1.0)

## 2014-06-29 LAB — CBC
HCT: 22.3 % — ABNORMAL LOW (ref 36.0–46.0)
HEMOGLOBIN: 7.9 g/dL — AB (ref 12.0–15.0)
MCH: 35 pg — ABNORMAL HIGH (ref 26.0–34.0)
MCHC: 35.4 g/dL (ref 30.0–36.0)
MCV: 98.7 fL (ref 78.0–100.0)
Platelets: 331 10*3/uL (ref 150–400)
RBC: 2.26 MIL/uL — AB (ref 3.87–5.11)
RDW: 12.1 % (ref 11.5–15.5)
WBC: 15.3 10*3/uL — ABNORMAL HIGH (ref 4.0–10.5)

## 2014-06-29 LAB — BASIC METABOLIC PANEL
Anion gap: 16 — ABNORMAL HIGH (ref 5–15)
BUN: 8 mg/dL (ref 6–23)
CALCIUM: 8.2 mg/dL — AB (ref 8.4–10.5)
CHLORIDE: 87 meq/L — AB (ref 96–112)
CO2: 19 meq/L (ref 19–32)
CREATININE: 0.54 mg/dL (ref 0.50–1.10)
GFR calc Af Amer: >90 mL/min (ref >90)
GFR calc non Af Amer: >90 mL/min (ref >90)
GLUCOSE: 164 mg/dL — AB (ref 70–99)
Potassium: 4.2 mEq/L (ref 3.7–5.3)
SODIUM: 122 meq/L — AB (ref 137–147)

## 2014-06-29 LAB — OSMOLALITY, URINE: OSMOLALITY UR: 634 mosm/kg (ref 390–1090)

## 2014-06-29 LAB — OSMOLALITY: Osmolality: 254 mOsm/kg — ABNORMAL LOW (ref 275–300)

## 2014-06-29 LAB — SODIUM, URINE, RANDOM: Sodium, Ur: 33 mEq/L

## 2014-06-29 LAB — CREATININE, URINE, RANDOM: Creatinine, Urine: 107.39 mg/dL

## 2014-06-29 MED ORDER — DOCUSATE SODIUM 100 MG PO CAPS
200.0000 mg | ORAL_CAPSULE | Freq: Two times a day (BID) | ORAL | Status: DC
  Administered 2014-06-29 – 2014-07-01 (×4): 200 mg via ORAL
  Filled 2014-06-29 (×5): qty 2

## 2014-06-29 MED ORDER — POLYETHYLENE GLYCOL 3350 17 G PO PACK
17.0000 g | PACK | Freq: Two times a day (BID) | ORAL | Status: DC
  Administered 2014-06-29 – 2014-06-30 (×3): 17 g via ORAL
  Filled 2014-06-29 (×6): qty 1

## 2014-06-29 MED ORDER — ONDANSETRON 8 MG/NS 50 ML IVPB
8.0000 mg | Freq: Once | INTRAVENOUS | Status: AC
  Administered 2014-06-29: 8 mg via INTRAVENOUS

## 2014-06-29 MED ORDER — BISACODYL 10 MG RE SUPP: 10.0000 mg | Freq: Every day | RECTAL | Status: DC

## 2014-06-29 MED ORDER — NALOXONE HCL 0.4 MG/ML IJ SOLN: 0.2000 mg | INTRAMUSCULAR | Status: DC | PRN

## 2014-06-29 MED ORDER — PROMETHAZINE HCL 25 MG/ML IJ SOLN
25.0000 mg | Freq: Four times a day (QID) | INTRAMUSCULAR | Status: DC | PRN
  Administered 2014-06-29: 25 mg via INTRAVENOUS
  Filled 2014-06-29: qty 1

## 2014-06-29 MED ORDER — SODIUM CHLORIDE 0.9 % IV SOLN
INTRAVENOUS | Status: DC
Start: 2014-06-29 — End: 2014-06-30
  Administered 2014-06-30: via INTRAVENOUS

## 2014-06-29 MED ORDER — OXYCODONE HCL 5 MG PO TABS
5.0000 mg | ORAL_TABLET | Freq: Four times a day (QID) | ORAL | Status: DC | PRN
  Administered 2014-06-29 – 2014-07-01 (×6): 5 mg via ORAL
  Filled 2014-06-29 (×6): qty 1

## 2014-06-29 MED ORDER — NALOXONE HCL 0.4 MG/ML IJ SOLN
0.4000 mg | INTRAMUSCULAR | Status: DC | PRN
  Filled 2014-06-29: qty 1

## 2014-06-29 MED ORDER — HYDROMORPHONE HCL 1 MG/ML IJ SOLN: 0.5000 mg | INTRAMUSCULAR | Status: DC | PRN

## 2014-06-29 NOTE — Progress Notes (Signed)
OT Cancellation Note  Patient Details Name: Adrienne Campbell MRN: [id number] DOB: [birth year]   Cancelled Treatment:    Reason Eval/Treat Not Completed: Fatigue/lethargy limiting ability to participate    Benito Mccreedy OTR/L [phone] 06/29/2014, 3:27 PM

## 2014-06-29 NOTE — Evaluation (Signed)
Physical Therapy Evaluation Patient Details Name: Adrienne Campbell MRN: [id number] DOB: May 16, 1961 Today's Date: 06/29/2014   History of Present Illness  L THA -posterior approach  Clinical Impression  *Pt is s/p THA resulting in the deficits listed below (see PT Problem List). ** Pt will benefit from skilled PT to increase their independence and safety with mobility to allow discharge to the venue listed below.   Pt has been lethargic this morning and early afternoon. Per nursing she recently was not oriented. During PT eval pt was groggy, but correctly answered to questions (where she is, year, birthdate, why she's here) and was able to follow directions.  +2 assist for supine to sit and to pivot to bedside commode. Assist needed was primarily for safety and following posterior hip precautions. SHe was able to actively move LLE and weightbear thru LLE in standing with RW. Good progress expected once she clears cognitively. RN administered Narcan during PT eval. Very supportive husband present and assisted during PT eval.  **    Follow Up Recommendations Home health PT    Equipment Recommendations  3in1 (PT)    Recommendations for Other Services       Precautions / Restrictions Precautions Precautions: Fall Restrictions LLE Weight Bearing: Weight bearing as tolerated      Mobility  Bed Mobility Overal bed mobility: Needs Assistance;+2 for physical assistance Bed Mobility: Supine to Sit     Supine to sit: Max assist;+2 for physical assistance;+2 for safety/equipment     General bed mobility comments: assist to advance LEs and raise trunk  Transfers Overall transfer level: Needs assistance   Transfers: Stand Pivot Transfers;Sit to/from Stand Sit to Stand: +2 safety/equipment;Mod assist Stand pivot transfers: +2 safety/equipment;Min assist       General transfer comment: multimodal cues for hand placement/safety/precautions, assist to rise  Ambulation/Gait                 Stairs            Wheelchair Mobility    Modified Rankin (Stroke Patients Only)       Balance Overall balance assessment: Needs assistance Sitting-balance support: Feet supported;Bilateral upper extremity supported Sitting balance-Leahy Scale: Fair       Standing balance-Leahy Scale: Poor                               Pertinent Vitals/Pain Pain Assessment: Faces Faces Pain Scale: Hurts little more Pain Intervention(s): Limited activity within patient's tolerance;Ice applied;Monitored during session    [place] expects to be discharged to:: Private residence Living Arrangements: Spouse/significant other Available Help at Discharge: Family;Available 24 hours/day Type of Home: House Home Access: Stairs to enter Entrance Stairs-Rails: None Entrance Stairs-Number of Steps: 3 + 3   Home Equipment: Walker - 2 wheels;Cane - single point      Prior Function Level of Independence: Independent with assistive device(s)         Comments: with cane     Hand Dominance   Dominant Hand: Right    Extremity/Trunk Assessment   Upper Extremity Assessment: Overall WFL for tasks assessed           Lower Extremity Assessment: LLE deficits/detail;Difficult to assess due to impaired cognition   LLE Deficits / Details: L knee ext 3/5 at least, L hip AAROM WFL, ankle WNL  Cervical / Trunk Assessment: Normal  Communication   Communication: No difficulties  Cognition Arousal/Alertness: Lethargic Behavior During  Therapy: WFL for tasks assessed/performed Overall Cognitive Status: Impaired/Different from baseline Area of Impairment: Safety/judgement     Memory: Decreased recall of precautions   Safety/Judgement: Decreased awareness of safety     General Comments: pt has been lethargic all morning, earlier today she was not oriented. this is likely due to narcotics per RN and hospitalist. At present pt is oriented to  self, location, situation, and can follow directions but is lethargic and has decreased safety awareness    General Comments      Exercises Total Joint Exercises Ankle Circles/Pumps: AROM;Left;5 reps;Supine Heel Slides: AAROM;Left;10 reps;Supine Hip ABduction/ADduction: AAROM;Left;10 reps;Supine      Assessment/Plan    PT Assessment Patient needs continued PT services  PT Diagnosis Difficulty walking;Acute pain;Altered mental status   PT Problem List Decreased strength;Decreased activity tolerance;Decreased balance;Pain;Decreased knowledge of use of DME;Decreased knowledge of precautions;Decreased mobility;Decreased cognition  PT Treatment Interventions DME instruction;Gait training;Stair training;Functional mobility training;Therapeutic activities;Patient/family education;Therapeutic exercise   PT Goals (Current goals can be found in the Care Plan section) Acute Rehab PT Goals Patient Stated Goal: return to work as Education officer, museum at [employer] PT Goal Formulation: With family Time For Goal Achievement: 07/13/14 Potential to Achieve Goals: Good    Frequency 7X/week   Barriers to discharge        Co-evaluation               End of Session Equipment Utilized During Treatment: Gait belt Activity Tolerance: Patient limited by lethargy Patient left: in bed;with call bell/phone within reach;with family/visitor present Nurse Communication: Mobility status         Time: 6222-9798 PT Time Calculation (min): 44 min   Charges:   PT Evaluation $Initial PT Evaluation Tier I: 1 Procedure PT Treatments $Therapeutic Exercise: 8-22 mins $Therapeutic Activity: 8-22 mins   PT G Codes:          Philomena Doheny 06/29/2014, 1:42 PM [phone]

## 2014-06-29 NOTE — Progress Notes (Signed)
PA notified patient remains sedated,disoriented, vss, not voiding on own. U/a obtained.

## 2014-06-29 NOTE — Progress Notes (Signed)
Patient ID: [name], female   DOB: 04-28-1961, 53 y.o.   MRN: [id number] PATIENT ID: Adrienne Campbell  MRN: [id number]  DOB/AGE:  11/13/1960 / 53 y.o.  1 Day Post-Op Procedure(s) (LRB): LEFT TOTAL HIP ARTHROPLASTY (Left)    PROGRESS NOTE Subjective: Patient is alert, oriented, 1x Nausea, 1x Vomiting, yes passing gas, no Bowel Movement. Taking PO sips. Denies SOB, Chest or Calf Pain. Using Incentive Spirometer, PAS in place. Ambulate WBAT Patient reports pain as 6 on 0-10 scale  .    Objective: Vital signs in last 24 hours: Filed Vitals:   06/28/14 2042 06/29/14 0000 06/29/14 0400 06/29/14 0602  BP: 130/82   143/67  Pulse: 94   113  Temp: 98.3 F (36.8 C)   98.5 F (36.9 C)  TempSrc:    Oral  Resp:  18 18   Height:      Weight:      SpO2: 95% 95% 95% 93%      Intake/Output from previous day: I/O last 3 completed shifts: In: 2200 [P.O.:500; I.V.:1700] Out: 1110 [Urine:960; Blood:150]   Intake/Output this shift:     LABORATORY DATA: No results found for this basename: WBC, HGB, HCT, PLT, NA, K, CL, CO2, BUN, CREATININE, GLUCOSE, GLUCAP, PT, INR, CALCIUM, 2,  in the last 72 hours  Examination: Neurologically intact ABD soft Neurovascular intact Sensation intact distally Intact pulses distally Dorsiflexion/Plantar flexion intact Incision: dressing C/D/I No cellulitis present Compartment soft} XR AP&Lat of hip shows well placed\fixed THA  Assessment:   1 Day Post-Op Procedure(s) (LRB): LEFT TOTAL HIP ARTHROPLASTY (Left) ADDITIONAL DIAGNOSIS:  Expected Acute Blood Loss Anemia, Depression  Plan: PT/OT WBAT, THA  posterior precautions  DVT Prophylaxis: SCDx72 hrs, Xarelto 10mg  qd x 4 weeks  DISCHARGE PLAN: Home  DISCHARGE NEEDS: HHPT, HHRN, CPM, Walker and 3-in-1 comode seat

## 2014-06-29 NOTE — Consult Note (Signed)
Patient Demographics  Adrienne Campbell, is a 53 y.o. female   MRN: [id number]   DOB - [birth year]  Admit Date - 06/28/2014    Outpatient Primary MD for the patient is [name], MD  Consult requested in the Hospital by Kerin Salen, MD, On 06/29/2014    Reason for consult Obtunded    With History of -  Past Medical History  Diagnosis Date  . Hyperlipidemia   . Hypertension   . Arthritis       Past Surgical History  Procedure Laterality Date  . Abdominal hysterectomy    . Total hip arthroplasty Left 06/28/2014    Procedure: LEFT TOTAL HIP ARTHROPLASTY;  Surgeon: Kerin Salen, MD;  Location: [facility];  Service: Orthopedics;  Laterality: Left;    in for   Elective L hip surgery  HPI  Adrienne Campbell  is a 53 y.o. female,  Spondyloarthritis, Dyslipidemia, Depression, DJD , admitted for an elective L Hip surgery done yesterday, since then getting Narcotics for pain control, today found obtunded, reduced PO intake, I was called to help with decreased mental status.  After seeing the patient gave patient 0.2 of IV Narcan, she is waking up much better, denies any headache, no chest abdominal pain, no focal weakness. Denies any palpitations or shortness of breath. She is constipated and somewhat nauseated, some left-sided hip pain. No history of stroke, TIA, CAD, CHF.    Review of Systems    In addition to the HPI above,   No Fever-chills, No Headache, No changes with Vision or hearing, No problems swallowing food or Liquids, No Chest pain, Cough or Shortness of Breath, No Abdominal pain, No Nausea or Vommitting, Bowel movements are regular, No Blood in stool or Urine, No dysuria, No new skin rashes or bruises, No new joints pains-aches, some L hip post op pain No new weakness, tingling, numbness in any  extremity, No recent weight gain or loss, No polyuria, polydypsia or polyphagia, No significant Mental Stressors.  A full 10 point Review of Systems was done, except as stated above, all other Review of Systems were negative.   Social History History  Substance Use Topics  . Smoking status: Never Smoker   . Smokeless tobacco: Never Used  . Alcohol Use: Not on file      Family History No CAD at young age   Prior to Admission medications   Medication Sig Start Date End Date Taking? Authorizing Provider  acetaminophen (TYLENOL) 650 MG CR tablet Take 1,300 mg by mouth 2 (two) times daily.   Yes Historical Provider, MD  Adalimumab (HUMIRA) 40 MG/0.8ML PSKT Inject 1 each into the skin once a week.   Yes Historical Provider, MD  celecoxib (CELEBREX) 200 MG capsule Take 200 mg by mouth 2 (two) times daily.   Yes Historical Provider, MD  cetirizine (ZYRTEC) 10 MG tablet Take 1 tablet (10 mg total) by mouth daily. 08/22/12  Yes Zigmund Gottron, MD  cholecalciferol (VITAMIN D) 1000 UNITS tablet Take 1,000  Units by mouth 2 (two) times daily.   Yes Historical Provider, MD  ciprofloxacin (CIPRO) 250 MG tablet Take 250 mg by mouth daily with breakfast.   Yes Historical Provider, MD  conjugated estrogens (PREMARIN) vaginal cream Place 1 Applicatorful vaginally 3 (three) times a week.   Yes Historical Provider, MD  folic acid (FOLVITE) 1 MG tablet Take 1 mg by mouth daily.   Yes Historical Provider, MD  hydrOXYzine (VISTARIL) 25 MG capsule Take 1 capsule (25 mg total) by mouth at bedtime. Used for sleep 08/22/12  Yes Zigmund Gottron, MD  Melatonin 5 MG CAPS Take 5 mg by mouth at bedtime.   Yes Historical Provider, MD  methotrexate (RHEUMATREX) 15 MG tablet Take 15 mg by mouth once a week. Caution: Chemotherapy. Protect from light.   Yes Historical Provider, MD  Misc Natural Products (GINSENG COMPLEX PO) Take 1 capsule by mouth daily.    Yes Historical Provider, MD  Multiple  Vitamins-Minerals (MULTIVITAMIN & MINERAL PO) Take by mouth at bedtime.   Yes Historical Provider, MD  pravastatin (PRAVACHOL) 40 MG tablet Take 40 mg by mouth daily.   Yes Historical Provider, MD  venlafaxine XR (EFFEXOR XR) 150 MG 24 hr capsule Take 1 capsule (150 mg total) by mouth daily. 08/22/12  Yes Zigmund Gottron, MD    Anti-infectives   Start     Dose/Rate Route Frequency Ordered Stop   06/29/14 0800  ciprofloxacin (CIPRO) tablet 250 mg     250 mg Oral Daily with breakfast 06/28/14 1559     06/28/14 0600  ceFAZolin (ANCEF) IVPB 2 g/50 mL premix     2 g 100 mL/hr over 30 Minutes Intravenous On call to O.R. 06/27/14 1232 06/28/14 1025      Scheduled Meds: . bisacodyl  10 mg Rectal Daily  . celecoxib  200 mg Oral BID  . cholecalciferol  1,000 Units Oral BID  . ciprofloxacin  250 mg Oral Q breakfast  . [START ON 06/30/2014] conjugated estrogens  1 Applicatorful Vaginal Once per day on Mon Wed Fri  . docusate sodium  200 mg Oral BID  . folic acid  1 mg Oral Daily  . loratadine  10 mg Oral Daily  . polyethylene glycol  17 g Oral BID  . pravastatin  40 mg Oral q1800  . rivaroxaban  10 mg Oral Q breakfast  . venlafaxine XR  150 mg Oral Daily   Continuous Infusions: . sodium chloride     PRN Meds:.acetaminophen, acetaminophen, aluminum hydroxide, HYDROmorphone (DILAUDID) injection, naLOXone (NARCAN)  injection, ondansetron (ZOFRAN) IV, oxyCODONE, senna-docusate  Allergies  Allergen Reactions  . Nsaids Hives    Specifically meloxicam and ibuprofen reported  . Onion Swelling  . Sulfa Antibiotics Swelling    Patient reports tongue swells  . Codeine Nausea And Vomiting  . Fluticasone     ulcers  . Gluten Meal Diarrhea  . Tramadol Hives    Physical Exam  Vitals  Blood pressure 122/64, pulse 91, temperature 98.6 F (37 C), temperature source Oral, resp. rate 16, height 5' 4.5" (1.638 m), weight 64.411 kg (142 lb), SpO2 94.00%.   1. General middle-aged white  female lying in bed in NAD,     2. Normal affect and insight, Not Suicidal or Homicidal, Awake Alert, Oriented X 3.  3. No F.N deficits, ALL C.Nerves Intact, Strength 5/5 all 4 extremities, Sensation intact all 4 extremities, Plantars down going.  4. Ears and Eyes appear Normal, Conjunctivae clear, PERRLA. Moist Oral Mucosa.  5. Supple Neck, No JVD, No cervical lymphadenopathy appriciated, No Carotid Bruits.  6. Symmetrical Chest wall movement, Good air movement bilaterally, CTAB.  7. RRR, No Gallops, Rubs or Murmurs, No Parasternal Heave.  8. Positive Bowel Sounds, Abdomen Soft, No tenderness, No organomegaly appriciated,No rebound -guarding or rigidity.  9.  No Cyanosis, Normal Skin Turgor, No Skin Rash or Bruise.  10. Good muscle tone,  joints appear normal , no effusions, Normal ROM.  11. No Palpable Lymph Nodes in Neck or Axillae    Data Review  CBC  Recent Labs Lab 06/29/14 0625  WBC 15.3*  HGB 7.9*  HCT 22.3*  PLT 331  MCV 98.7  MCH 35.0*  MCHC 35.4  RDW 12.1   ------------------------------------------------------------------------------------------------------------------  Chemistries   Recent Labs Lab 06/29/14 0625  NA 122*  K 4.2  CL 87*  CO2 19  GLUCOSE 164*  BUN 8  CREATININE 0.54  CALCIUM 8.2*   ------------------------------------------------------------------------------------------------------------------ estimated creatinine clearance is 70.9 ml/min (by C-G formula based on Cr of 0.54). ------------------------------------------------------------------------------------------------------------------ No results found for this basename: TSH, T4TOTAL, FREET3, T3FREE, THYROIDAB,  in the last 72 hours   Coagulation profile No results found for this basename: INR, PROTIME,  in the last 168 hours ------------------------------------------------------------------------------------------------------------------- No results found for this  basename: DDIMER,  in the last 72 hours -------------------------------------------------------------------------------------------------------------------  Cardiac Enzymes No results found for this basename: CK, CKMB, TROPONINI, MYOGLOBIN,  in the last 168 hours ------------------------------------------------------------------------------------------------------------------ No components found with this basename: POCBNP,    ---------------------------------------------------------------------------------------------------------------  Urinalysis    Component Value Date/Time   COLORURINE YELLOW 06/18/2014 [place] 06/18/2014 0949   LABSPEC 1.018 06/18/2014 0949   PHURINE 5.5 06/18/2014 [place] 06/18/2014 [place] 06/18/2014 [place] 06/18/2014 [place] 06/18/2014 [place] 06/18/2014 0949   UROBILINOGEN 0.2 06/18/2014 0949   NITRITE NEGATIVE 06/18/2014 0949   LEUKOCYTESUR NEGATIVE 06/18/2014 0949     Imaging results:   Dg Pelvis Portable  06/28/2014   CLINICAL DATA:  Postop left hip replacement.  Arthritis of left hip.  EXAM: PORTABLE PELVIS 1-2 VIEWS  COMPARISON:  12/29/2013  FINDINGS: Changes of left hip replacement. Normal AP alignment. No visible hardware or bony complicating feature. Early degenerative changes in the right hip. SI joints are symmetric and unremarkable.  IMPRESSION: No acute bony abnormality.   Electronically Signed   By: Rolm Baptise M.D.   On: 06/28/2014 14:41    My personal review of EKG: Rhythm NSR, no Acute ST changes    Assessment & Plan   1.Metabolic/toxic encephalopathy due to accidental narcotic overuse - minimize narcotics, good results with 0.2 of Narcan, aspiration and fall precautions. Check ABG and ammonia for completion, no headache or focal deficits will hold head CT. Will monitor patient with you.   2. Hyponatremia. Decreased oral  intake, also could have some element of SIADH due to pain and narcotic use, she was on D5 half-normal, will check serum osmolarity, urine sodium and osmolarity, switch IV fluids to normal saline, monitor BMP closely.   3. Spondylarthritis. Supportive care, on Humira, methotrexate and Celebrex outpatient.   4.Dyslipidemia continue home dose statin.   5. Depression resume Effexor     DVT Prophylaxis per primary team with his orthopedics  AM Labs Ordered, also please review Full Orders  Family Communication: Plan discussed with patient and husband   Thank you for the consult, we will follow the  patient with you in the Hospital.   Lala Lund K M.D on 06/29/2014 at 1:36 PM  Between 7am to 7pm - Pager - [phone]  After 7pm go to [email or web address] - password [email or web address]  And look for the night coverage person covering me after hours   Thank you for the consult, we will follow the patient with you in the [facility] Office  [phone]

## 2014-06-29 NOTE — Progress Notes (Signed)
Orthopedic Tech Progress Note Patient Details:  Adrienne Campbell 09-28-1960 [id number]  Ortho Devices Ortho Device/Splint Location: applied ohf to bed Ortho Device/Splint Interventions: Ordered;Application   [name] 06/29/2014, 4:55 PM

## 2014-06-29 NOTE — Progress Notes (Signed)
PT Cancellation Note  Patient Details Name: [name] MRN: [id number] DOB: February 13, 1961   Cancelled Treatment:    Reason Eval/Treat Not Completed: Fatigue/lethargy limiting ability to participate (Pt is lethargic due to overmedication per RN, she's had pain, nausea, and muscle relaxer medication. Will attempt later today. )   Philomena Doheny 06/29/2014, 11:17 AM [phone]

## 2014-06-30 LAB — BASIC METABOLIC PANEL
Anion gap: 8 (ref 5–15)
BUN: 8 mg/dL (ref 6–23)
CO2: 28 mEq/L (ref 19–32)
Calcium: 8.4 mg/dL (ref 8.4–10.5)
Chloride: 105 mEq/L (ref 96–112)
Creatinine, Ser: 0.72 mg/dL (ref 0.50–1.10)
GFR calc Af Amer: >90 mL/min (ref >90)
Glucose, Bld: 111 mg/dL — ABNORMAL HIGH (ref 70–99)
Potassium: 4.5 mEq/L (ref 3.7–5.3)
SODIUM: 141 meq/L (ref 137–147)

## 2014-06-30 LAB — URINALYSIS, ROUTINE W REFLEX MICROSCOPIC
Bilirubin Urine: NEGATIVE
GLUCOSE, UA: NEGATIVE mg/dL
HGB URINE DIPSTICK: NEGATIVE
Ketones, ur: NEGATIVE mg/dL
LEUKOCYTES UA: NEGATIVE
Nitrite: NEGATIVE
PH: 6 (ref 5.0–8.0)
Protein, ur: NEGATIVE mg/dL
SPECIFIC GRAVITY, URINE: 1.008 (ref 1.005–1.030)
Urobilinogen, UA: 1 mg/dL (ref 0.0–1.0)

## 2014-06-30 LAB — CBC
HCT: 18.3 % — ABNORMAL LOW (ref 36.0–46.0)
Hemoglobin: 6.4 g/dL — CL (ref 12.0–15.0)
MCH: 34 pg (ref 26.0–34.0)
MCHC: 35 g/dL (ref 30.0–36.0)
MCV: 97.3 fL (ref 78.0–100.0)
PLATELETS: 257 10*3/uL (ref 150–400)
RBC: 1.88 MIL/uL — ABNORMAL LOW (ref 3.87–5.11)
RDW: 12.4 % (ref 11.5–15.5)
WBC: 10 10*3/uL (ref 4.0–10.5)

## 2014-06-30 LAB — ABO/RH: ABO/RH(D): A NEG

## 2014-06-30 LAB — PREPARE RBC (CROSSMATCH)

## 2014-06-30 MED ORDER — FUROSEMIDE 10 MG/ML IJ SOLN: 20.0000 mg | Freq: Once | INTRAMUSCULAR | Status: DC

## 2014-06-30 MED ORDER — ONDANSETRON HCL 4 MG PO TABS
4.0000 mg | ORAL_TABLET | Freq: Once | ORAL | Status: AC
  Administered 2014-06-30: 4 mg via ORAL
  Filled 2014-06-30: qty 1

## 2014-06-30 MED ORDER — SODIUM CHLORIDE 0.9 % IV SOLN: Freq: Once | INTRAVENOUS | Status: DC

## 2014-06-30 MED ORDER — DIPHENHYDRAMINE HCL 50 MG/ML IJ SOLN
25.0000 mg | Freq: Four times a day (QID) | INTRAMUSCULAR | Status: DC | PRN
  Administered 2014-06-30: 25 mg via INTRAVENOUS
  Filled 2014-06-30: qty 1

## 2014-06-30 MED ORDER — SODIUM CHLORIDE 0.9 % IV SOLN
Freq: Once | INTRAVENOUS | Status: AC
  Administered 2014-06-30: 08:00:00 via INTRAVENOUS

## 2014-06-30 NOTE — Progress Notes (Signed)
Consult note                                            Patient Demographics  Adrienne Campbell, is a 53 y.o. female, DOB - [birth year], PIR:[id number]  Admit date - 06/28/2014   Admitting Physician Kerin Salen, MD  Outpatient Primary MD for the patient is [name], MD  LOS - 2   No chief complaint on file.       Subjective:   Adrienne Campbell today has, No headache, No chest pain, No abdominal pain - No Nausea, No new weakness tingling or numbness, No Cough - SOB. Mild left leg pain. Feels much better  Assessment & Plan     1.Metabolic/toxic encephalopathy due to accidental narcotic overuse - resolved after cutting back narcotics and Narcan. Minimize narcotic use.    2. Hyponatremia. Due to comminution of decreased oral intake and some SIADH. She was also getting D5W which was stopped and switched to normal saline 75 mL an hour for 15 hours. Resolved.    3. Spondylarthritis. Supportive care, on Humira, methotrexate and Celebrex outpatient.    4.Dyslipidemia continue home dose statin.    5. Depression resume Effexor   6. Blood loss related anemia. Transfuse 2 units and monitor.   7. Constipation with mild ileus. Bowel regimen. Minimize narcotics. Increase activity. Monitor electrolytes. Symptom-free at this time.     Medications  Scheduled Meds: . sodium chloride   Intravenous Once  . bisacodyl  10 mg Rectal Daily  . celecoxib  200 mg Oral BID  . cholecalciferol  1,000 Units Oral BID  . ciprofloxacin  250 mg Oral Q breakfast  . conjugated estrogens  1 Applicatorful Vaginal Once per day on Mon Wed Fri  . docusate sodium  200 mg Oral BID  . folic acid  1 mg Oral Daily  . loratadine  10 mg Oral Daily  . polyethylene glycol  17 g Oral BID  . pravastatin  40 mg Oral q1800    . rivaroxaban  10 mg Oral Q breakfast  . venlafaxine XR  150 mg Oral Daily   Continuous Infusions:  PRN Meds:.acetaminophen, acetaminophen, aluminum hydroxide, diphenhydrAMINE, HYDROmorphone (DILAUDID) injection, naLOXone (NARCAN)  injection, ondansetron (ZOFRAN) IV, oxyCODONE, promethazine, senna-docusate  DVT Prophylaxis  xaralto  Lab Results  Component Value Date   PLT 257 06/30/2014    Antibiotics     Anti-infectives   Start     Dose/Rate Route Frequency Ordered Stop   06/29/14 0800  ciprofloxacin (CIPRO) tablet 250 mg     250 mg Oral Daily with breakfast 06/28/14 1559     06/28/14 0600  ceFAZolin (ANCEF) IVPB 2 g/50 mL premix     2 g 100 mL/hr over 30 Minutes Intravenous On call to O.R. 06/27/14 1232 06/28/14 1025          Objective:   Filed Vitals:   06/29/14 2010 06/29/14 2323 06/30/14 0400 06/30/14 0502  BP: 127/58   104/50  Pulse: 108   98  Temp: 98.6 F (37 C)   98.7 F (37.1 C)  TempSrc: Oral   Oral  Resp: 17 16 16 18   Height:  Weight:      SpO2: 98% 99% 98% 98%    Wt Readings from Last 3 Encounters:  06/28/14 64.411 kg (142 lb)  06/28/14 64.411 kg (142 lb)  06/18/14 65.1 kg (143 lb 8.3 oz)     Intake/Output Summary (Last 24 hours) at 06/30/14 1032 Last data filed at 06/30/14 0800  Gross per 24 hour  Intake 1668.75 ml  Output    702 ml  Net 966.75 ml     Physical Exam  Awake Alert, Oriented X 3, No new F.N deficits, Normal affect Adrienne Campbell,PERRAL Supple Neck,No JVD, No cervical lymphadenopathy appriciated.  Symmetrical Chest wall movement, Good air movement bilaterally, CTAB RRR,No Gallops,Rubs or new Murmurs, No Parasternal Heave +ve B.Sounds, Abd Soft, No tenderness, No organomegaly appriciated, No rebound - guarding or rigidity. No Cyanosis, Clubbing or edema, No new Rash or bruise, left leg in splint   Data Review   Micro Results No results found for this or any previous visit (from the past 240 hour(s)).  Radiology  Reports Dg Chest 2 View  06/18/2014   CLINICAL DATA:  Pre operative respiratory exam. Osteoarthritis of the left hip. The patient is for left total hip arthroplasty.  EXAM: CHEST  2 VIEW  COMPARISON:  None.  FINDINGS: Heart size and pulmonary vascularity are normal and the lungs are clear. Slight density inferior medially in the right lung base is felt to represent a confluence of normal vascular structures.  No osseous abnormality.  IMPRESSION: Normal chest.   Electronically Signed   By: Rozetta Nunnery M.D.   On: 06/18/2014 11:07   Dg Pelvis Portable  06/28/2014   CLINICAL DATA:  Postop left hip replacement.  Arthritis of left hip.  EXAM: PORTABLE PELVIS 1-2 VIEWS  COMPARISON:  12/29/2013  FINDINGS: Changes of left hip replacement. Normal AP alignment. No visible hardware or bony complicating feature. Early degenerative changes in the right hip. SI joints are symmetric and unremarkable.  IMPRESSION: No acute bony abnormality.   Electronically Signed   By: Rolm Baptise M.D.   On: 06/28/2014 14:41   Dg Abd 2 Views  06/29/2014   CLINICAL DATA:  Nausea vomiting.  EXAM: ABDOMEN - 2 VIEW  COMPARISON:  06/28/2014.  FINDINGS: Air-filled loops of small and large bowel noted. These findings suggest adynamic ileus. Stool in colon . No free air. Left hip replacement. Degenerative changes lumbar spine. Degenerative changes right hip.  IMPRESSION: Findings suggesting mild adynamic ileus.   Electronically Signed   By: Marcello Moores  Register   On: 06/29/2014 20:10     CBC  Recent Labs Lab 06/29/14 0625 06/30/14 0556  WBC 15.3* 10.0  HGB 7.9* 6.4*  HCT 22.3* 18.3*  PLT 331 257  MCV 98.7 97.3  MCH 35.0* 34.0  MCHC 35.4 35.0  RDW 12.1 12.4    Chemistries   Recent Labs Lab 06/29/14 0625 06/30/14 0556  NA 122* 141  K 4.2 4.5  CL 87* 105  CO2 19 28  GLUCOSE 164* 111*  BUN 8 8  CREATININE 0.54 0.72  CALCIUM 8.2* 8.4    ------------------------------------------------------------------------------------------------------------------ estimated creatinine clearance is 70.9 ml/min (by C-G formula based on Cr of 0.72). ------------------------------------------------------------------------------------------------------------------ No results found for this basename: HGBA1C,  in the last 72 hours ------------------------------------------------------------------------------------------------------------------ No results found for this basename: CHOL, HDL, LDLCALC, TRIG, CHOLHDL, LDLDIRECT,  in the last 72 hours ------------------------------------------------------------------------------------------------------------------ No results found for this basename: TSH, T4TOTAL, FREET3, T3FREE, THYROIDAB,  in the last 72 hours ------------------------------------------------------------------------------------------------------------------ No results found for this  basename: VITAMINB12, FOLATE, FERRITIN, TIBC, IRON, RETICCTPCT,  in the last 72 hours  Coagulation profile No results found for this basename: INR, PROTIME,  in the last 168 hours  No results found for this basename: DDIMER,  in the last 72 hours  Cardiac Enzymes No results found for this basename: CK, CKMB, TROPONINI, MYOGLOBIN,  in the last 168 hours ------------------------------------------------------------------------------------------------------------------ No components found with this basename: POCBNP,      Time Spent in minutes   35   Rhys Lichty K M.D on 06/30/2014 at 10:32 AM  Between 7am to 7pm - Pager - [phone]  After 7pm go to [email or web address] - password [email or web address]  And look for the night coverage person covering for me after hours  [facility] Office  [phone]

## 2014-06-30 NOTE — Discharge Instructions (Signed)
Information on my medicine - XARELTO® (Rivaroxaban) ° °This medication education was reviewed with me or my healthcare representative as part of my discharge preparation.  The pharmacist that spoke with me during my hospital stay was:  Sandy Level, Gelsey Amyx Danielle, RPH ° °Why was Xarelto® prescribed for you? °Xarelto® was prescribed for you to reduce the risk of blood clots forming after orthopedic surgery. The medical term for these abnormal blood clots is venous thromboembolism (VTE). ° °What do you need to know about xarelto® ? °Take your Xarelto® ONCE DAILY at the same time every day. °You may take it either with or without food. ° °If you have difficulty swallowing the tablet whole, you may crush it and mix in applesauce just prior to taking your dose. ° °Take Xarelto® exactly as prescribed by your doctor and DO NOT stop taking Xarelto® without talking to the doctor who prescribed the medication.  Stopping without other VTE prevention medication to take the place of Xarelto® may increase your risk of developing a clot. ° °After discharge, you should have regular check-up appointments with your healthcare provider that is prescribing your Xarelto®.   ° °What do you do if you miss a dose? °If you miss a dose, take it as soon as you remember on the same day then continue your regularly scheduled once daily regimen the next day. Do not take two doses of Xarelto® on the same day.  ° °Important Safety Information °A possible side effect of Xarelto® is bleeding. You should call your healthcare provider right away if you experience any of the following: °? Bleeding from an injury or your nose that does not stop. °? Unusual colored urine (red or dark brown) or unusual colored stools (red or black). °? Unusual bruising for unknown reasons. °? A serious fall or if you hit your head (even if there is no bleeding). ° °Some medicines may interact with Xarelto® and might increase your risk of bleeding while on Xarelto®. To help  avoid this, consult your healthcare provider or pharmacist prior to using any new prescription or non-prescription medications, including herbals, vitamins, non-steroidal anti-inflammatory drugs (NSAIDs) and supplements. ° °This website has more information on Xarelto®: www.xarelto.com. ° ° °

## 2014-06-30 NOTE — Progress Notes (Signed)
PT Cancellation Note  Patient Details Name: Adrienne Campbell MRN: [id number] DOB: 1960/09/19   Cancelled Treatment:    Reason Eval/Treat Not Completed: Fatigue/lethargy limiting ability to participate;Medical issues which prohibited therapy. Patient stated that she was still awaiting blood transfusion. Hgb currently 6.4 and patient feeling nauseated and fatigued. Will follow up in AM   Robin[place], Tonia Brooms 06/30/2014, 2:07 PM

## 2014-06-30 NOTE — Progress Notes (Signed)
CRITICAL VALUE ALERT  Critical value received:  Hgb 6.4  Date of notification:  10/28  Time of notification:  0700  Critical value read back:Yes.    Nurse who received alert:  Leia Alf RN  MD notified (1st page):  Dr. Candiss Norse already placed orders    Time of first page:  0725- orders were placed  MD notified (2nd page):  Time of second page:  Responding MD:  Dr. Candiss Norse  Time MD responded:  [phone]

## 2014-06-30 NOTE — Care Management Note (Addendum)
CARE MANAGEMENT NOTE 06/30/2014  Patient:  Adrienne Campbell, Adrienne Campbell   Account Number:  [id number]  Date Initiated:  06/29/2014  Documentation initiated by:  Ricki Miller  Subjective/Objective Assessment:   53 yr old female admitted with left hip osteoarthritis. Pateint had a left total hip arthroplasty.     Action/Plan:   PT/OT eval.  Case manager spoke with patient concerning home health and DME needs. Choice offered. Referral called to Amy, [facility] Liaison. patient has family  support at discharge.   Anticipated DC Date:  07/01/2014   Anticipated DC Plan:  [place]  CM consult      PAC Choice  [place]   Choice offered to / List presented to:     DME arranged  [facility]  [facility]-N-1    [place]   DME agency  [employer]    [facility]   [place] arranged  HH-2 PT      [place].   Status of service:  Completed, signed off Medicare Important Message given?   (If response is "NO", the following Medicare IM given date fields will be blank) Date Medicare IM given:   Medicare IM given by:   Date Additional Medicare IM given:   Additional Medicare IM given by:    Discharge Disposition:  [place]  Per UR Regulation:  Reviewed for med. necessity/level of care/duration of stay

## 2014-06-30 NOTE — Progress Notes (Signed)
Physical Therapy Treatment Patient Details Name: Adrienne Campbell MRN: [id number] DOB: 1961-06-02 Today's Date: 06/30/2014    History of Present Illness L THA -posterior approach    PT Comments    Pt progressing mobility; limited due to incr fatigue and nausea today. Symptoms most likely due to decr Hgb. Pt planned for transfusion this afternoon. Hopeful for D/C home tomorrow.   Follow Up Recommendations  Home health PT;Supervision/Assistance - 24 hour     Equipment Recommendations  3in1 (PT)    Recommendations for Other Services       Precautions / Restrictions Precautions Precautions: Fall;Posterior Hip Precaution Comments: reviewed handout and precautions; pt able to independently recall 2/3  Restrictions Weight Bearing Restrictions: Yes LLE Weight Bearing: Weight bearing as tolerated    Mobility  Bed Mobility Overal bed mobility: Needs Assistance Bed Mobility: Supine to Sit     Supine to sit: Min assist     General bed mobility comments: pt attempting to cross ankles; educated on using long sheet and UEs to (A) Lt LE off EOB; min (A) to advance lt LE   Transfers Overall transfer level: Needs assistance Equipment used: Rolling walker (2 wheeled) Transfers: Sit to/from Stand Sit to Stand: Min guard         General transfer comment: cues for safety and sequencing  Ambulation/Gait Ambulation/Gait assistance: Min guard Ambulation Distance (Feet): 50 Feet Assistive device: Rolling walker (2 wheeled) Gait Pattern/deviations: Step-to pattern;Decreased stance time - left;Decreased step length - right;Antalgic;Narrow base of support Gait velocity: decreased Gait velocity interpretation: <1.8 ft/sec, indicative of risk for recurrent falls     Stairs            Wheelchair Mobility    Modified Rankin (Stroke Patients Only)       Balance Overall balance assessment: Needs assistance Sitting-balance support: Feet supported;No upper extremity  supported Sitting balance-Leahy Scale: Fair Sitting balance - Comments: denied any dizziness   Standing balance support: During functional activity;Bilateral upper extremity supported Standing balance-Leahy Scale: Poor Standing balance comment: denied any dizziness but relying on RW                    Cognition Arousal/Alertness: Awake/alert Behavior During Therapy: WFL for tasks assessed/performed Overall Cognitive Status: Within Functional Limits for tasks assessed       Memory: Decreased recall of precautions         General Comments: pt more awake and alert this session    Exercises Total Joint Exercises Ankle Circles/Pumps: AROM;Both;10 reps;Seated Quad Sets: AROM;Left;10 reps;Supine Heel Slides: AAROM;Left;10 reps;Supine Hip ABduction/ADduction: AAROM;Left;10 reps;Supine Long Arc Quad: AROM;Strengthening;Left;10 reps;Seated    General Comments General comments (skin integrity, edema, etc.): educated on precautions and safe technique with sidelying in bed      Pertinent Vitals/Pain Pain Assessment: 0-10 Pain Score: 5  Pain Location: Lt hip Pain Descriptors / Indicators: Aching Pain Intervention(s): Monitored during session;Premedicated before session;Repositioned;Ice applied    Home Living                      Prior Function            PT Goals (current goals can now be found in the care plan section) Acute Rehab PT Goals Patient Stated Goal: to go home tomorrow PT Goal Formulation: With family Time For Goal Achievement: 07/03/14 Potential to Achieve Goals: Good Progress towards PT goals: Progressing toward goals    Frequency  7X/week    PT Plan Current plan remains  appropriate    Co-evaluation             End of Session Equipment Utilized During Treatment: Gait belt Activity Tolerance: Patient limited by fatigue Patient left: with call bell/phone within reach;in bed     Time: 1002-1018 PT Time Calculation (min): 16  min  Charges:  $Gait Training: 8-22 mins                    G Codes:      Adrienne Campbell, Adrienne Campbell 06/30/2014, 10:28 AM

## 2014-06-30 NOTE — Progress Notes (Signed)
PATIENT ID: Adrienne Campbell  MRN: [id number]  DOB/AGE:  01/08/1961 / 53 y.o.  2 Days Post-Op Procedure(s) (LRB): LEFT TOTAL HIP ARTHROPLASTY (Left)    PROGRESS NOTE Subjective: Patient is alert, oriented, no Nausea, no Vomiting, yes passing gas, no Bowel Movement. Taking PO sips with anticipation of advancing diet. Denies SOB, Chest or Calf Pain. Using Incentive Spirometer, PAS in place. Ambulate WBAT Patient reports pain as 4 on 0-10 scale  .    Objective: Vital signs in last 24 hours: Filed Vitals:   06/29/14 2010 06/29/14 2323 06/30/14 0400 06/30/14 0502  BP: 127/58   104/50  Pulse: 108   98  Temp: 98.6 F (37 C)   98.7 F (37.1 C)  TempSrc: Oral   Oral  Resp: 17 16 16 18   Height:      Weight:      SpO2: 98% 99% 98% 98%      Intake/Output from previous day: I/O last 3 completed shifts: In: 300 [P.O.:300] Out: 1665 [Urine:1665]   Intake/Output this shift: Total I/O In: 1368.8 [I.V.:1368.8] Out: -    LABORATORY DATA:  Recent Labs  06/29/14 0625 06/30/14 0556  WBC 15.3* 10.0  HGB 7.9* 6.4*  HCT 22.3* 18.3*  PLT 331 257  NA 122* 141  K 4.2 4.5  CL 87* 105  CO2 19 28  BUN 8 8  CREATININE 0.54 0.72  GLUCOSE 164* 111*  CALCIUM 8.2* 8.4    Examination: Neurologically intact Neurovascular intact Sensation intact distally Intact pulses distally Dorsiflexion/Plantar flexion intact Incision: dressing C/D/I No cellulitis present Compartment soft} XR AP&Lat of hip shows well placed\fixed THA  Assessment:   2 Days Post-Op Procedure(s) (LRB): LEFT TOTAL HIP ARTHROPLASTY (Left) ADDITIONAL DIAGNOSIS:  Expected Acute Blood Loss Anemia, depression  Hyponatremia - pt given normal saline and now normal  Symptomatic Anemia - pt given 2 untis.  Plan: PT/OT WBAT, THA  posterior precautions  DVT Prophylaxis: SCDx72 hrs, ASA 325 mg BID x 2 weeks  DISCHARGE PLAN: Home, when pt meets pt goals.  Likely tomorrow.  DISCHARGE NEEDS: HHPT, HHRN, Walker and  3-in-1 comode seat

## 2014-06-30 NOTE — Evaluation (Signed)
Occupational Therapy Evaluation Patient Details Name: Adrienne Campbell MRN: [id number] DOB: 1961-07-27 Today's Date: 06/30/2014    History of Present Illness L THA -posterior approach   Clinical Impression   Pt s/p above. Pt independent with ADLs, PTA. Feel pt will benefit from acute OT to increase independence prior to d/c. Plan to practice tub transfer and LB ADLs with AE next session.    Follow Up Recommendations  No OT follow up;Supervision - Intermittent    Equipment Recommendations  3 in 1 bedside comode;Tub/shower bench (AE)    Recommendations for Other Services       Precautions / Restrictions Precautions Precautions: Fall;Posterior Hip Precaution Comments: educated on precautions Restrictions Weight Bearing Restrictions: Yes LLE Weight Bearing: Weight bearing as tolerated      Mobility Bed Mobility Overal bed mobility: Needs Assistance Bed Mobility: Supine to Sit;Sit to Supine     Supine to sit: Min assist Sit to supine: Supervision   General bed mobility comments: cues for technique; used trapeze to scoot HOB; discussed positioning in bed.  Transfers Overall transfer level: Needs assistance Equipment used: Rolling walker (2 wheeled) Transfers: Sit to/from Stand Sit to Stand: Min guard         General transfer comment: cues for placement of LLE/technique         ADL Overall ADL's : Needs assistance/impaired     Grooming: Wash/dry hands;Min guard;Standing           Upper Body Dressing : Set up;Supervision/safety;Sitting   Lower Body Dressing: Minimal assistance;With adaptive equipment;Sit to/from stand   Toilet Transfer: Min guard;Ambulation;RW;Comfort height toilet;Grab bars   Toileting- Clothing Manipulation and Hygiene: Minimal assistance;Sit to/from stand       Functional mobility during ADLs: Min guard;Rolling walker General ADL Comments: Educated on AE for LB ADLs and pt practiced. Discussed use of bag on walker and uses  of reacher. Educated on tub transfer techniques and options for shower chair.  Educated on dressing technique.       Vision                     Perception     Praxis      Pertinent Vitals/Pain Pain Assessment: 0-10 Pain Score: 5  Pain Location: left hip Pain Descriptors / Indicators: Sore Pain Intervention(s): Monitored during session;Repositioned     Hand Dominance Right   Extremity/Trunk Assessment Upper Extremity Assessment Upper Extremity Assessment: Overall WFL for tasks assessed   Lower Extremity Assessment Lower Extremity Assessment: Defer to PT evaluation       Communication Communication Communication: No difficulties   Cognition Arousal/Alertness: Awake/alert Behavior During Therapy: WFL for tasks assessed/performed Overall Cognitive Status: Within Functional Limits for tasks assessed (did not know time of day)          General Comments          Shoulder Instructions      Home Living Family/patient expects to be discharged to:: Private residence Living Arrangements: Spouse/significant other Available Help at Discharge: Family;Available 24 hours/day Type of Home: House Home Access: Stairs to enter [employer] of Steps: 3 + 3 Entrance Stairs-Rails: None       Bathroom Shower/Tub: Teacher, early years/pre: Standard     Home Equipment: Environmental consultant - 2 wheels;Cane - single point          Prior Functioning/Environment Level of Independence: Independent with assistive device(s)        Comments: with cane    OT Diagnosis: Acute  pain   OT Problem List: Decreased activity tolerance;Decreased strength;Pain;Decreased knowledge of use of DME or AE;Decreased knowledge of precautions   OT Treatment/Interventions: Self-care/ADL training;DME and/or AE instruction;Patient/family education;Balance training;Therapeutic activities    OT Goals(Current goals can be found in the care plan section) Acute Rehab OT Goals Patient  Stated Goal: be independent OT Goal Formulation: With patient Time For Goal Achievement: 07/07/14 Potential to Achieve Goals: Good ADL Goals Pt Will Perform Lower Body Dressing: with modified independence;with adaptive equipment;sit to/from stand Pt Will Transfer to Toilet: with modified independence;ambulating (3 in 1 over commode) Pt Will Perform Tub/Shower Transfer: Tub transfer;with supervision;ambulating;tub bench;rolling walker  OT Frequency: Min 2X/week   Barriers to D/C:            Co-evaluation              End of Session Equipment Utilized During Treatment: Gait belt;Rolling walker  Activity Tolerance: Other (comment) (nausea) Patient left: in bed;with call bell/phone within reach   Time: 0857-0924 OT Time Calculation (min): 27 min Charges:  OT General Charges $OT Visit: 1 Procedure OT Evaluation $Initial OT Evaluation Tier I: 1 Procedure OT Treatments $Self Care/Home Management : 8-22 mins G-CodesBenito Mccreedy OTR/L [phone] 06/30/2014, 10:45 AM

## 2014-07-01 ENCOUNTER — Encounter (HOSPITAL_COMMUNITY): Payer: Self-pay | Admitting: General Practice

## 2014-07-01 LAB — URINE CULTURE
Colony Count: NO GROWTH
Culture: NO GROWTH

## 2014-07-01 LAB — TYPE AND SCREEN
ABO/RH(D): A NEG
Antibody Screen: NEGATIVE
Unit division: 0
Unit division: 0

## 2014-07-01 LAB — HEMOGLOBIN AND HEMATOCRIT, BLOOD
HEMATOCRIT: 24.3 % — AB (ref 36.0–46.0)
Hemoglobin: 8.4 g/dL — ABNORMAL LOW (ref 12.0–15.0)

## 2014-07-01 MED ORDER — RIVAROXABAN 10 MG PO TABS: 10.0000 mg | ORAL_TABLET | Freq: Every day | ORAL | Status: DC

## 2014-07-01 MED ORDER — OXYCODONE-ACETAMINOPHEN 5-325 MG PO TABS: 1.0000 | ORAL_TABLET | ORAL | Status: DC | PRN

## 2014-07-01 MED ORDER — MAGNESIUM CITRATE PO SOLN
1.0000 | Freq: Once | ORAL | Status: AC
  Administered 2014-07-01: 1 via ORAL
  Filled 2014-07-01: qty 296

## 2014-07-01 NOTE — Care Management Note (Signed)
CARE MANAGEMENT NOTE 07/01/2014  Patient:  Adrienne Campbell, Adrienne Campbell   Account Number:  [id number]  Date Initiated:  06/29/2014  Documentation initiated by:  Ricki Miller   DME arranged  [facility]  3-N-1  TUB BENCH      DME agency  [facility] arranged  [place].   Status of service:  Completed, signed off  Discharge Disposition:  [place]  Per UR Regulation:  Reviewed for med. necessity/level of care/duration of stay      Comments:  07/01/14 10:00am Ricki Miller, RN BSN Case Manager Patient requested a tub bench for home. case manager explained that tub bench is  not covered by insurance and they will be required to pay  $61.00 when it is delivered to her room. patient and husband agreed. Case Manager contacted [place], [place] liaison to place request.

## 2014-07-01 NOTE — Progress Notes (Signed)
Patient ID: Adrienne Campbell, female   DOB: 06-06-1961, 53 y.o.   MRN: [id number] PATIENT ID: Adrienne Campbell  MRN: [id number]  DOB/AGE:  02/18/1961 / 53 y.o.  3 Days Post-Op Procedure(s) (LRB): LEFT TOTAL HIP ARTHROPLASTY (Left)    PROGRESS NOTE Subjective: Patient is alert, oriented, no Nausea, no Vomiting, yes passing gas, no Bowel Movement. Taking PO well, got up at 3:00 this morning and took a shower without difficulty feeling much better than yesterday. Denies SOB, Chest or Calf Pain. Using Incentive Spirometer, PAS in place. Ambulate WBAT Patient reports pain as 3 on 0-10 scale  .    Objective: Vital signs in last 24 hours: Filed Vitals:   06/30/14 2225 06/30/14 2246 07/01/14 0144 07/01/14 0442  BP: 131/63 113/56 110/57 99/59  Pulse: 102 99 98 90  Temp: 98.7 F (37.1 C) 98.9 F (37.2 C) 99.4 F (37.4 C) 98.6 F (37 C)  TempSrc: Oral Oral Oral Oral  Resp: 18 16 15 17   Height:      Weight:      SpO2: 98% 95% 98% 96%      Intake/Output from previous day: I/O last 3 completed shifts: In: 3102.8 [P.O.:1020; I.V.:1368.8; Blood:714] Out: -    Intake/Output this shift:     LABORATORY DATA:  Recent Labs  06/29/14 0625 06/30/14 0556 07/01/14 0344  WBC 15.3* 10.0  --   HGB 7.9* 6.4* 8.4*  HCT 22.3* 18.3* 24.3*  PLT 331 257  --   NA 122* 141  --   K 4.2 4.5  --   CL 87* 105  --   CO2 19 28  --   BUN 8 8  --   CREATININE 0.54 0.72  --   GLUCOSE 164* 111*  --   CALCIUM 8.2* 8.4  --     Examination: Neurologically intact ABD soft Neurovascular intact Sensation intact distally Intact pulses distally Dorsiflexion/Plantar flexion intact Incision: dressing C/D/I No cellulitis present Compartment soft}, her left thigh has 1-2+ swelling but it is soft she can easily flex to 95 internal and external rotate 40 each, I am not sure exactly where her blood loss has occurred but today her hemoglobin is 8.4 after 2 units of packed red cells. XR AP&Lat of hip  shows well placed\fixed THA  Assessment:   3 Days Post-Op Procedure(s) (LRB): LEFT TOTAL HIP ARTHROPLASTY (Left) ADDITIONAL DIAGNOSIS:  Expected Acute Blood Loss Anemia  Plan: PT/OT WBAT, THA  posterior precautions  DVT Prophylaxis: SCDx72 hrs, ASA 325 mg BID x 2 weeks  DISCHARGE PLAN: Home, when patient meets all PT goals this could very well be today based on her markedly improved energy level and mobility that I observed when she was resting in bed.  DISCHARGE NEEDS: HHPT, HHRN, CPM, Walker and 3-in-1 comode seat

## 2014-07-01 NOTE — Progress Notes (Signed)
Spoke with OTA Romana Juniper), all education completed and pt without further questions about BADLs, we will D/C from acute OT. [place], [place] [phone] 07/01/2014

## 2014-07-01 NOTE — Progress Notes (Signed)
Physical Therapy Treatment Patient Details Name: Adrienne Campbell MRN: [id number] DOB: 1960-12-22 Today's Date: 07/01/2014    History of Present Illness L THA -posterior approach    PT Comments    Pt mobilizing well today. Son and pt educated on proper technique for stair management. Reviewed HEP and car transfer technique with pt. Pt safe from mobility standpoint to D/C home today.    Follow Up Recommendations  Home health PT;Supervision/Assistance - 24 hour     Equipment Recommendations  3in1 (PT)    Recommendations for Other Services       Precautions / Restrictions Precautions Precautions: Posterior Hip Precaution Booklet Issued: Yes (comment) Precaution Comments: reviewed precautions with pt  Restrictions Weight Bearing Restrictions: Yes LLE Weight Bearing: Weight bearing as tolerated    Mobility  Bed Mobility Overal bed mobility: Modified Independent Bed Mobility: Supine to Sit     Supine to sit: Modified independent (Device/Increase time)     General bed mobility comments: not addressed; pt up with OT and then returned to chair   Transfers Overall transfer level: Needs assistance Equipment used: Rolling walker (2 wheeled) Transfers: Sit to/from Stand Sit to Stand: Supervision Stand pivot transfers: Supervision       General transfer comment: min cues for technique   Ambulation/Gait Ambulation/Gait assistance: Supervision Ambulation Distance (Feet): 40 Feet Assistive device: Rolling walker (2 wheeled) Gait Pattern/deviations: Step-through pattern;Decreased stance time - right;Decreased stance time - left;Trunk flexed Gait velocity: decr Gait velocity interpretation: Below normal speed for age/gender General Gait Details: distance limited due to fatigue and nausea; pt had already been wakling with OT; educated on proper technique and at supervision level    Stairs Stairs: Yes Stairs assistance: Min guard Stair Management: Backwards;With  walker;No rails;Step to pattern Number of Stairs: 3 General stair comments: educated pt and son on technique; given handout; they were able to perform safely with cues   Wheelchair Mobility    Modified Rankin (Stroke Patients Only)       Balance Overall balance assessment: Needs assistance Sitting-balance support: Feet supported;No upper extremity supported Sitting balance-Leahy Scale: Fair     Standing balance support: During functional activity;Bilateral upper extremity supported Standing balance-Leahy Scale: Poor Standing balance comment: relying heavily on RW for balance                     Cognition Arousal/Alertness: Awake/alert Behavior During Therapy: WFL for tasks assessed/performed Overall Cognitive Status: Within Functional Limits for tasks assessed                      Exercises Total Joint Exercises Ankle Circles/Pumps: AROM;Both;10 reps;Seated Quad Sets: AROM;Left;10 reps;Seated Gluteal Sets: 10 reps Heel Slides: AAROM;5 reps Hip ABduction/ADduction: AAROM;Strengthening;Left;5 reps;Seated Long Arc Quad: AROM;Strengthening;Left;10 reps;Seated Other Exercises Other Exercises: reviewed HEP handout with pt and son    General Comments General comments (skin integrity, edema, etc.): educated on car transfer technique and home setup safety       Pertinent Vitals/Pain Pain Assessment: 0-10 Pain Score: 3  Pain Location: Lt hip Pain Descriptors / Indicators: Aching;Sore Pain Intervention(s): Monitored during session;Premedicated before session;Repositioned    Home Living                      Prior Function            PT Goals (current goals can now be found in the care plan section) Acute Rehab PT Goals Patient Stated Goal: to go home today  PT Goal Formulation: With family Time For Goal Achievement: 07/03/14 Potential to Achieve Goals: Good Progress towards PT goals: Progressing toward goals    Frequency  7X/week    PT  Plan Current plan remains appropriate    Co-evaluation             End of Session Equipment Utilized During Treatment: Gait belt Activity Tolerance: Patient tolerated treatment well Patient left: in chair;with call bell/phone within reach;with family/visitor present     Time: 0850-0908 PT Time Calculation (min): 18 min  Charges:  $Gait Training: 8-22 mins                    G CodesElie Confer [place], [place] 07/01/2014, 9:50 AM

## 2014-07-01 NOTE — Discharge Summary (Signed)
Patient ID: Adrienne Campbell MRN: [id number] DOB/AGE: [birth year] 53 y.o.  Admit date: 06/28/2014 Discharge date: 07/01/2014  Admission Diagnoses:  Principal Problem:   Arthritis of left hip Active Problems:   Depression   Hypercholesteremia   Spondylarthritis   Arthritis, hip   Discharge Diagnoses:  Same  Past Medical History  Diagnosis Date  . Hyperlipidemia   . Hypertension   . Arthritis     Surgeries: Procedure(s): LEFT TOTAL HIP ARTHROPLASTY on 06/28/2014   Consultants:    Discharged Condition: Improved  Hospital Course: Adrienne Campbell is a 53 y.o. female who was admitted 06/28/2014 for operative treatment ofArthritis of left hip. Patient has severe unremitting pain that affects sleep, daily activities, and work/hobbies. After pre-op clearance the patient was taken to the operating room on 06/28/2014 and underwent  Procedure(s): LEFT TOTAL HIP ARTHROPLASTY.    Patient was given perioperative antibiotics: Anti-infectives   Start     Dose/Rate Route Frequency Ordered Stop   06/29/14 0800  ciprofloxacin (CIPRO) tablet 250 mg     250 mg Oral Daily with breakfast 06/28/14 1559     06/28/14 0600  ceFAZolin (ANCEF) IVPB 2 g/50 mL premix     2 g 100 mL/hr over 30 Minutes Intravenous On call to O.R. 06/27/14 1232 06/28/14 1025       Patient was given sequential compression devices, early ambulation, and chemoprophylaxis to prevent DVT. Patient's hemoglobin did drift down to 6.4 and she received 2 units of packed red blood cells 06/30/2014 with markedly improved energy that assisted her with discharge. Her leg was not more than 1-2+ swollen and she had excellent mobility of the hip prior to discharge. Hemoglobin on 07/01/2014 was 8.4.  Patient benefited maximally from hospital stay and there were no complications.    Recent vital signs: Patient Vitals for the past 24 hrs:  BP Temp Temp src Pulse Resp SpO2  07/01/14 0442 99/59 mmHg 98.6 F (37 C) Oral 90  17 96 %  07/01/14 0144 110/57 mmHg 99.4 F (37.4 C) Oral 98 15 98 %  06/30/14 2246 113/56 mmHg 98.9 F (37.2 C) Oral 99 16 95 %  06/30/14 2225 131/63 mmHg 98.7 F (37.1 C) Oral 102 18 98 %  06/30/14 2150 100/49 mmHg 99.3 F (37.4 C) Oral 99 17 97 %  06/30/14 2000 - - - - 18 99 %  06/30/14 1930 104/51 mmHg 99.5 F (37.5 C) Oral 64 16 96 %  06/30/14 1900 107/54 mmHg 98.6 F (37 C) Oral 67 16 93 %  06/30/14 1231 96/53 mmHg 99.1 F (37.3 C) - - 18 93 %     Recent laboratory studies:  Recent Labs  06/29/14 0625 06/30/14 0556 07/01/14 0344  WBC 15.3* 10.0  --   HGB 7.9* 6.4* 8.4*  HCT 22.3* 18.3* 24.3*  PLT 331 257  --   NA 122* 141  --   K 4.2 4.5  --   CL 87* 105  --   CO2 19 28  --   BUN 8 8  --   CREATININE 0.54 0.72  --   GLUCOSE 164* 111*  --   CALCIUM 8.2* 8.4  --      Discharge Medications:     Medication List         acetaminophen 650 MG CR tablet  Commonly known as:  TYLENOL  Take 1,300 mg by mouth 2 (two) times daily.     celecoxib 200 MG capsule  Commonly known as:  CELEBREX  Take 200 mg by mouth 2 (two) times daily.     cetirizine 10 MG tablet  Commonly known as:  ZYRTEC  Take 1 tablet (10 mg total) by mouth daily.     cholecalciferol 1000 UNITS tablet  Commonly known as:  VITAMIN D  Take 1,000 Units by mouth 2 (two) times daily.     CIPRO 250 MG tablet  Generic drug:  ciprofloxacin  Take 250 mg by mouth daily with breakfast.     conjugated estrogens vaginal cream  Commonly known as:  PREMARIN  Place 1 Applicatorful vaginally 3 (three) times a week.     EFFEXOR XR 150 MG 24 hr capsule  Generic drug:  venlafaxine XR  Take 1 capsule (150 mg total) by mouth daily.     folic acid 1 MG tablet  Commonly known as:  FOLVITE  Take 1 mg by mouth daily.     GINSENG COMPLEX PO  Take 1 capsule by mouth daily.     HUMIRA 40 MG/0.8ML Pskt  Generic drug:  Adalimumab  Inject 1 each into the skin once a week.     hydrOXYzine 25 MG capsule   Commonly known as:  VISTARIL  Take 1 capsule (25 mg total) by mouth at bedtime. Used for sleep     Melatonin 5 MG Caps  Take 5 mg by mouth at bedtime.     methotrexate 15 MG tablet  Commonly known as:  RHEUMATREX  Take 15 mg by mouth once a week. Caution: Chemotherapy. Protect from light.     MULTIVITAMIN & MINERAL PO  Take by mouth at bedtime.     oxyCODONE-acetaminophen 5-325 MG per tablet  Commonly known as:  ROXICET  Take 1 tablet by mouth every 4 (four) hours as needed for severe pain.     pravastatin 40 MG tablet  Commonly known as:  PRAVACHOL  Take 40 mg by mouth daily.     rivaroxaban 10 MG Tabs tablet  Commonly known as:  XARELTO  Take 1 tablet (10 mg total) by mouth daily.        Diagnostic Studies: Dg Chest 2 View  06/18/2014   CLINICAL DATA:  Pre operative respiratory exam. Osteoarthritis of the left hip. The patient is for left total hip arthroplasty.  EXAM: CHEST  2 VIEW  COMPARISON:  None.  FINDINGS: Heart size and pulmonary vascularity are normal and the lungs are clear. Slight density inferior medially in the right lung base is felt to represent a confluence of normal vascular structures.  No osseous abnormality.  IMPRESSION: Normal chest.   Electronically Signed   By: Rozetta Nunnery M.D.   On: 06/18/2014 11:07   Dg Pelvis Portable  06/28/2014   CLINICAL DATA:  Postop left hip replacement.  Arthritis of left hip.  EXAM: PORTABLE PELVIS 1-2 VIEWS  COMPARISON:  12/29/2013  FINDINGS: Changes of left hip replacement. Normal AP alignment. No visible hardware or bony complicating feature. Early degenerative changes in the right hip. SI joints are symmetric and unremarkable.  IMPRESSION: No acute bony abnormality.   Electronically Signed   By: Rolm Baptise M.D.   On: 06/28/2014 14:41   Dg Abd 2 Views  06/29/2014   CLINICAL DATA:  Nausea vomiting.  EXAM: ABDOMEN - 2 VIEW  COMPARISON:  06/28/2014.  FINDINGS: Air-filled loops of small and large bowel noted. These findings  suggest adynamic ileus. Stool in colon . No free air. Left hip replacement. Degenerative changes lumbar spine. Degenerative changes right hip.  IMPRESSION: Findings suggesting mild adynamic ileus.   Electronically Signed   By: Harmon   On: 06/29/2014 20:10    Disposition:       Discharge Instructions   Call MD / Call 911    Complete by:  As directed   If you experience chest pain or shortness of breath, CALL 911 and be transported to the hospital emergency room.  If you develope a fever above 101 F, pus (white drainage) or increased drainage or redness at the wound, or calf pain, call your surgeon's office.     Change dressing    Complete by:  As directed   Change dressing as needed, otherwise remove on 10th postop day.     Constipation Prevention    Complete by:  As directed   Drink plenty of fluids.  Prune juice may be helpful.  You may use a stool softener, such as Colace (over the counter) 100 mg twice a day.  Use MiraLax (over the counter) for constipation as needed.     Diet - low sodium heart healthy    Complete by:  As directed      Follow the hip precautions as taught in Physical Therapy    Complete by:  As directed      Increase activity slowly as tolerated    Complete by:  As directed            Follow-up Information   Follow up with Kerin Salen, MD In 1 week.   Specialty:  Orthopedic Surgery   Contact information:   [place] [id number] [phone]        Signed: Kerin Salen 07/01/2014, 7:17 AM

## 2014-07-01 NOTE — Progress Notes (Signed)
Consult note                                            Patient Demographics  Adrienne Campbell, is a 53 y.o. female, DOB - 10/28/1960, QQV:[id number]  Admit date - 06/28/2014   Admitting Physician Adrienne Salen, MD  Outpatient Primary MD for the patient is Adrienne Amel, MD  LOS - 3   No chief complaint on file.       Subjective:   Adrienne Campbell today has, No headache, No chest pain, No abdominal pain - No Nausea, No new weakness tingling or numbness, No Cough - SOB. Mild left leg pain. Feels much better  Assessment & Plan    Hospitalist team will sign off. Kindly call with any questions.    1.Metabolic/toxic encephalopathy due to accidental narcotic overuse - resolved after cutting back narcotics and Narcan. Minimize narcotic use.    2. Hyponatremia. After IVF Resolved.    3. Spondylarthritis. Supportive care, on Humira, methotrexate and Celebrex outpatient.    4.Dyslipidemia continue home dose statin.    5. Depression resume Effexor   6. Blood loss related anemia. Stable after 2 units of packed RBC transfusion on 06/30/2014.   7. Constipation with mild ileus. Bowel regimen. Minimize narcotics. Increase activity. Monitor electrolytes. Symptom-free at this time.     Medications  Scheduled Meds: . sodium chloride   Intravenous Once  . bisacodyl  10 mg Rectal Daily  . celecoxib  200 mg Oral BID  . cholecalciferol  1,000 Units Oral BID  . ciprofloxacin  250 mg Oral Q breakfast  . conjugated estrogens  1 Applicatorful Vaginal Once per day on Mon Wed Fri  . docusate sodium  200 mg Oral BID  . folic acid  1 mg Oral Daily  . loratadine  10 mg Oral Daily  . polyethylene glycol  17 g Oral BID  . pravastatin  40 mg Oral q1800  . rivaroxaban  10 mg Oral Q breakfast  .  venlafaxine XR  150 mg Oral Daily   Continuous Infusions:  PRN Meds:.acetaminophen, acetaminophen, aluminum hydroxide, diphenhydrAMINE, HYDROmorphone (DILAUDID) injection, naLOXone (NARCAN)  injection, ondansetron (ZOFRAN) IV, oxyCODONE, promethazine, senna-docusate  DVT Prophylaxis  xaralto  Lab Results  Component Value Date   PLT 257 06/30/2014    Antibiotics     Anti-infectives   Start     Dose/Rate Route Frequency Ordered Stop   06/29/14 0800  ciprofloxacin (CIPRO) tablet 250 mg     250 mg Oral Daily with breakfast 06/28/14 1559     06/28/14 0600  ceFAZolin (ANCEF) IVPB 2 g/50 mL premix     2 g 100 mL/hr over 30 Minutes Intravenous On call to O.R. 06/27/14 1232 06/28/14 1025          Objective:   Filed Vitals:   06/30/14 2225 06/30/14 2246 07/01/14 0144 07/01/14 0442  BP: 131/63 113/56 110/57 99/59  Pulse: 102 99 98 90  Temp: 98.7 F (37.1 C) 98.9 F (37.2 C) 99.4 F (37.4 C) 98.6 F (37 C)  TempSrc: Oral Oral Oral Oral  Resp: 18 16 15 17   Height:      Weight:  SpO2: 98% 95% 98% 96%    Wt Readings from Last 3 Encounters:  06/28/14 64.411 kg (142 lb)  06/28/14 64.411 kg (142 lb)  06/18/14 65.1 kg (143 lb 8.3 oz)     Intake/Output Summary (Last 24 hours) at 07/01/14 0952 Last data filed at 07/01/14 0144  Gross per 24 hour  Intake   1314 ml  Output      0 ml  Net   1314 ml     Physical Exam  Awake Alert, Oriented X 3, No new F.N deficits, Normal affect [place].AT,PERRAL Supple Neck,No JVD, No cervical lymphadenopathy appriciated.  Symmetrical Chest wall movement, Good air movement bilaterally, CTAB RRR,No Gallops,Rubs or new Murmurs, No Parasternal Heave +ve B.Sounds, Abd Soft, No tenderness, No organomegaly appriciated, No rebound - guarding or rigidity. No Cyanosis, Clubbing or edema, No new Rash or bruise, left leg in splint   Data Review   Micro Results No results found for this or any previous visit (from the past 240  hour(s)).  Radiology Reports Dg Chest 2 View  06/18/2014   CLINICAL DATA:  Pre operative respiratory exam. Osteoarthritis of the left hip. The patient is for left total hip arthroplasty.  EXAM: CHEST  2 VIEW  COMPARISON:  None.  FINDINGS: Heart size and pulmonary vascularity are normal and the lungs are clear. Slight density inferior medially in the right lung base is felt to represent a confluence of normal vascular structures.  No osseous abnormality.  IMPRESSION: Normal chest.   Electronically Signed   By: Rozetta Nunnery M.D.   On: 06/18/2014 11:07   Dg Pelvis Portable  06/28/2014   CLINICAL DATA:  Postop left hip replacement.  Arthritis of left hip.  EXAM: PORTABLE PELVIS 1-2 VIEWS  COMPARISON:  12/29/2013  FINDINGS: Changes of left hip replacement. Normal AP alignment. No visible hardware or bony complicating feature. Early degenerative changes in the right hip. SI joints are symmetric and unremarkable.  IMPRESSION: No acute bony abnormality.   Electronically Signed   By: Rolm Baptise M.D.   On: 06/28/2014 14:41   Dg Abd 2 Views  06/29/2014   CLINICAL DATA:  Nausea vomiting.  EXAM: ABDOMEN - 2 VIEW  COMPARISON:  06/28/2014.  FINDINGS: Air-filled loops of small and large bowel noted. These findings suggest adynamic ileus. Stool in colon . No free air. Left hip replacement. Degenerative changes lumbar spine. Degenerative changes right hip.  IMPRESSION: Findings suggesting mild adynamic ileus.   Electronically Signed   By: Marcello Moores  Register   On: 06/29/2014 20:10     CBC  Recent Labs Lab 06/29/14 8921 06/30/14 0556 07/01/14 0344  WBC 15.3* 10.0  --   HGB 7.9* 6.4* 8.4*  HCT 22.3* 18.3* 24.3*  PLT 331 257  --   MCV 98.7 97.3  --   MCH 35.0* 34.0  --   MCHC 35.4 35.0  --   RDW 12.1 12.4  --     Chemistries   Recent Labs Lab 06/29/14 0625 06/30/14 0556  NA 122* 141  K 4.2 4.5  CL 87* 105  CO2 19 28  GLUCOSE 164* 111*  BUN 8 8  CREATININE 0.54 0.72  CALCIUM 8.2* 8.4    ------------------------------------------------------------------------------------------------------------------ estimated creatinine clearance is 70.9 ml/min (by C-G formula based on Cr of 0.72). ------------------------------------------------------------------------------------------------------------------ No results found for this basename: HGBA1C,  in the last 72 hours ------------------------------------------------------------------------------------------------------------------ No results found for this basename: CHOL, HDL, LDLCALC, TRIG, CHOLHDL, LDLDIRECT,  in the last 72 hours ------------------------------------------------------------------------------------------------------------------ No  results found for this basename: TSH, T4TOTAL, FREET3, T3FREE, THYROIDAB,  in the last 72 hours ------------------------------------------------------------------------------------------------------------------ No results found for this basename: VITAMINB12, FOLATE, FERRITIN, TIBC, IRON, RETICCTPCT,  in the last 72 hours  Coagulation profile No results found for this basename: INR, PROTIME,  in the last 168 hours  No results found for this basename: DDIMER,  in the last 72 hours  Cardiac Enzymes No results found for this basename: CK, CKMB, TROPONINI, MYOGLOBIN,  in the last 168 hours ------------------------------------------------------------------------------------------------------------------ No components found with this basename: POCBNP,      Time Spent in minutes   35   Gabryel Talamo K M.D on 07/01/2014 at 9:52 AM  Between 7am to 7pm - Pager - [phone]  After 7pm go to [email or web address] - password [email or web address]  And look for the night coverage person covering for me after hours  [facility] Office  [phone]

## 2014-07-01 NOTE — Progress Notes (Signed)
Occupational Therapy Treatment Patient Details Name: CHARMA MOCARSKI MRN: [id number] DOB: 22-Mar-1961 Today's Date: 07/01/2014    History of present illness L THA -posterior approach   OT comments  Pt. Progressing well with OT goals and ready for d/c from OT and eager to d/c home today as well.  Able to complete safe tub transfer with bench and wants one for home.  All other equipment has been delivered to room.  Follow Up Recommendations  No OT follow up;Supervision - Intermittent    Equipment Recommendations  Tub/shower bench , R side faucet         Precautions / Restrictions Precautions Precautions: Fall;Posterior Hip Restrictions LLE Weight Bearing: Weight bearing as tolerated       Mobility Bed Mobility Overal bed mobility: Modified Independent Bed Mobility: Supine to Sit     Supine to sit: Modified independent (Device/Increase time)     General bed mobility comments: no cues required for technique  Transfers Overall transfer level: Needs assistance   Transfers: Sit to/from Stand Sit to Stand: Supervision Stand pivot transfers: Supervision       General transfer comment: cues for placement of LLE/technique, and pushing and reaching for surface during sit/stand vs pulling on walker    Balance                                   ADL Overall ADL's : Needs assistance/impaired                         Toilet Transfer: Supervision/safety;Ambulation;RW Toilet Transfer Details (indicate cue type and reason): simulated with eob to recliner transfer in room, reports toileting without assistance throughout the night Toileting- Clothing Manipulation and Hygiene: Supervision/safety;Sit to/from stand Toileting - Clothing Manipulation Details (indicate cue type and reason): simulated during transfer Tub/ Shower Transfer: Tub transfer;Supervision/safety;Cueing for sequencing;Tub bench;Grab bars Tub/Shower Transfer Details (indicate cue type  and reason): reviewed benefits of adding hand held shower to increase safety and independence during bathing Functional mobility during ADLs: Rolling walker General ADL Comments: moving well, able to demonstrate safe tub transfer with use of tub bench and requests one for home                                                                                              General Comments      Pertinent Vitals/ Pain       Pain Assessment: No/denies pain Pain Intervention(s): Premedicated before session                                                          Frequency Min 2X/week     Progress Toward Goals  OT Goals(current goals can now be found in the care plan section)  Progress towards OT goals: Goals met/education completed, patient discharged from [facility]  Discharge plan remains appropriate                     End of Session Equipment Utilized During Treatment: Rolling walker   Activity Tolerance Patient tolerated treatment well   Patient Left Other (comment) (in gym with PT)   Nurse Communication          Time: 0929-5747 OT Time Calculation (min): 14 min  Charges: OT General Charges $OT Visit: 1 Procedure OT Treatments $Self Care/Home Management : 8-22 mins  Janice Coffin, COTA/L 07/01/2014, 9:05 AM

## 2014-07-13 ENCOUNTER — Other Ambulatory Visit: Payer: Self-pay | Admitting: Orthopedic Surgery

## 2014-08-03 ENCOUNTER — Encounter (HOSPITAL_COMMUNITY): Payer: Self-pay

## 2014-08-03 ENCOUNTER — Encounter (HOSPITAL_COMMUNITY)
Admission: RE | Admit: 2014-08-03 | Discharge: 2014-08-03 | Disposition: A | Discharge status: Home / Self Care | Payer: 59 | Source: Ambulatory Visit | Attending: Orthopedic Surgery | Admitting: Orthopedic Surgery

## 2014-08-03 DIAGNOSIS — Z01812 Encounter for preprocedural laboratory examination: Secondary | ICD-10-CM | POA: Insufficient documentation

## 2014-08-03 HISTORY — DX: Pneumonia, unspecified organism: J18.9

## 2014-08-03 HISTORY — DX: Nausea with vomiting, unspecified: R11.2

## 2014-08-03 HISTORY — DX: Depression, unspecified: F32.A

## 2014-08-03 HISTORY — DX: Insomnia, unspecified: G47.00

## 2014-08-03 HISTORY — DX: Other allergic rhinitis: J30.89

## 2014-08-03 HISTORY — DX: Major depressive disorder, single episode, unspecified: F32.9

## 2014-08-03 HISTORY — DX: Other specified postprocedural states: Z98.890

## 2014-08-03 LAB — BASIC METABOLIC PANEL
Anion gap: 13 (ref 5–15)
BUN: 15 mg/dL (ref 6–23)
CO2: 24 mEq/L (ref 19–32)
Calcium: 9.1 mg/dL (ref 8.4–10.5)
Chloride: 105 mEq/L (ref 96–112)
Creatinine, Ser: 0.79 mg/dL (ref 0.50–1.10)
GFR calc Af Amer: >90 mL/min (ref >90)
Glucose, Bld: 98 mg/dL (ref 70–99)
POTASSIUM: 4.4 meq/L (ref 3.7–5.3)
SODIUM: 142 meq/L (ref 137–147)

## 2014-08-03 LAB — CBC WITH DIFFERENTIAL/PLATELET
BASOS ABS: 0 10*3/uL (ref 0.0–0.1)
Basophils Relative: 0 % (ref 0–1)
EOS ABS: 0.3 10*3/uL (ref 0.0–0.7)
EOS PCT: 4 % (ref 0–5)
HCT: 35.9 % — ABNORMAL LOW (ref 36.0–46.0)
Hemoglobin: 12.3 g/dL (ref 12.0–15.0)
Lymphocytes Relative: 45 % (ref 12–46)
Lymphs Abs: 3.2 10*3/uL (ref 0.7–4.0)
MCH: 34.5 pg — AB (ref 26.0–34.0)
MCHC: 34.3 g/dL (ref 30.0–36.0)
MCV: 100.6 fL — AB (ref 78.0–100.0)
Monocytes Absolute: 0.6 10*3/uL (ref 0.1–1.0)
Monocytes Relative: 8 % (ref 3–12)
NEUTROS PCT: 43 % (ref 43–77)
Neutro Abs: 3.1 10*3/uL (ref 1.7–7.7)
PLATELETS: 321 10*3/uL (ref 150–400)
RBC: 3.57 MIL/uL — ABNORMAL LOW (ref 3.87–5.11)
RDW: 14.2 % (ref 11.5–15.5)
WBC: 7.2 10*3/uL (ref 4.0–10.5)

## 2014-08-03 LAB — PROTIME-INR
INR: 1.18 (ref 0.00–1.49)
PROTHROMBIN TIME: 15.1 s (ref 11.6–15.2)

## 2014-08-03 LAB — APTT: aPTT: 33 seconds (ref 24–37)

## 2014-08-03 LAB — SURGICAL PCR SCREEN
MRSA, PCR: NEGATIVE
STAPHYLOCOCCUS AUREUS: NEGATIVE

## 2014-08-03 NOTE — Pre-Procedure Instructions (Signed)
Adrienne Campbell  08/03/2014   Your procedure is scheduled on:  Wednesday August 11, 2014 at 3:10 PM.  Report to [facility] Admitting at 1:10 PM.  Call this number if you have problems the morning of surgery: [phone]   Remember:   Do not eat food or drink liquids after midnight.   Take these medicines the morning of surgery with A SIP OF WATER: Acetaminophen (Tylenol), Cetirizine (Zyrtec), Oxycodone if needed, Venlafaxine (Effexor XR)   Stop taking any herbal medications 7 days before your surgery (Ex. Celebrex, Vitamins, Multivitamins, Ginseng, Advil, Motrin, etc)    Do not wear jewelry, make-up or nail polish.  Do not wear lotions, powders, or perfumes.   Do not shave 48 hours prior to surgery.   Do not bring valuables to the hospital.  [facility] is not responsible for any belongings or valuables.               Contacts, dentures or bridgework may not be worn into surgery.  Leave suitcase in the car. After surgery it may be brought to your room.  For patients admitted to the hospital, discharge time is determined by your treatment team.               Patients discharged the day of surgery will not be allowed to drive home.  Name and phone number of your driver:   Special Instructions: Shower using CHG soap the night before and the morning of your surgery   Please read over the following fact sheets that you were given: Pain Booklet, Coughing and Deep Breathing, Blood Transfusion Information, Total Joint Packet, MRSA Information and Surgical Site Infection Prevention

## 2014-08-03 NOTE — Progress Notes (Signed)
PCP is Dibas Koirala. Urologist is Dr. Irine Seal at [facility] and she recently had her urethral stretched at this office. Patient denied having any cardiac or pulmonary issues. Patient did however inform Nurse that she was very nauseated after having her hip surgery in October 2015.  Lastly patient received a blood transfusion before being discharged after having her hip surgery, therefore type and screen will be done STAT DOS.

## 2014-08-04 LAB — URINE CULTURE
Colony Count: NO GROWTH
Culture: NO GROWTH

## 2014-08-10 MED ORDER — CEFAZOLIN SODIUM-DEXTROSE 2-3 GM-% IV SOLR
2.0000 g | INTRAVENOUS | Status: AC
  Administered 2014-08-11: 2 g via INTRAVENOUS
  Filled 2014-08-10: qty 50

## 2014-08-10 MED ORDER — DEXTROSE-NACL 5-0.45 % IV SOLN: INTRAVENOUS | Status: DC

## 2014-08-10 NOTE — H&P (Signed)
TOTAL KNEE ADMISSION H&P  Patient is being admitted for right total knee arthroplasty.  Subjective:  Chief Complaint:right knee pain.  HPI: Adrienne Campbell, 53 y.o. female, has a history of pain and functional disability in the right knee due to arthritis and has failed non-surgical conservative treatments for greater than 12 weeks to includeNSAID's and/or analgesics, flexibility and strengthening excercises, use of assistive devices and activity modification.  Onset of symptoms was gradual, starting several years ago with gradually worsening course since that time. The patient noted no past surgery on the right knee(s).  Patient currently rates pain in the right knee(s) at 10 out of 10 with activity. Patient has night pain, worsening of pain with activity and weight bearing, pain that interferes with activities of daily living, pain with passive range of motion, crepitus and joint swelling.  Patient has evidence of subchondral sclerosis, periarticular osteophytes and joint space narrowing by imaging studies.  There is no active infection.  Patient Active Problem List   Diagnosis Date Noted  . Arthritis of left hip 06/28/2014  . Arthritis, hip 06/28/2014  . Insomnia 03/03/2012  . 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 Osteoarthritis of multiple joints 12/04/2010  . Allergic rhinitis 09/03/2010  . Hypercholesteremia 09/04/2007  . Postmenopausal HRT (hormone replacement therapy) 09/03/2005  . Depression 09/03/2001   Past Medical History  Diagnosis Date  . Hyperlipidemia   . Arthritis   . PONV (postoperative nausea and vomiting)     Nausea  . Pneumonia     hx of  . Depression   . Environmental and seasonal allergies   . Insomnia     Past Surgical History  Procedure Laterality Date  . Abdominal hysterectomy    . Total hip arthroplasty Left 06/28/2014    Procedure: LEFT TOTAL HIP ARTHROPLASTY;  Surgeon: Kerin Salen, MD;  Location: [facility];  Service: Orthopedics;  Laterality: Left;  .  Colonoscopy      No prescriptions prior to admission   Allergies  Allergen Reactions  . Nsaids Hives    Specifically meloxicam and ibuprofen reported  . Onion Swelling  . Sulfa Antibiotics Swelling    Patient reports tongue swells  . Codeine Nausea And Vomiting  . Fluticasone     ulcers  . Gluten Meal Diarrhea  . Oxycodone Nausea Only    Severe nausea; pt has to take with Zofran  . Tramadol Hives    History  Substance Use Topics  . Smoking status: Never Smoker   . Smokeless tobacco: Never Used  . Alcohol Use: No     Comment: RARE    No family history on file.   Review of Systems  Constitutional: Positive for malaise/fatigue and diaphoresis.  Gastrointestinal: Positive for constipation.  Genitourinary: Positive for dysuria and urgency.  Musculoskeletal: Positive for myalgias and joint pain.  Psychiatric/Behavioral: The patient has insomnia.     Objective:  Physical Exam  Constitutional: She is oriented to person, place, and time. She appears well-developed and well-nourished.  HENT:  Head: Normocephalic and atraumatic.  Eyes: Pupils are equal, round, and reactive to light.  Neck: Normal range of motion. Neck supple.  Cardiovascular: Intact distal pulses.   Respiratory: Effort normal.  Musculoskeletal: She exhibits tenderness.  Patient's right knee also has tenderness with palpation over the medial joint line.  Palpable osteophytes.  Mild effusion.  No erythema or warmth.  She is neurovascularly intact distally.  Neurological: She is alert and oriented to person, place, and time.  Skin: Skin is warm and dry.  Psychiatric: She has a normal mood and affect. Her behavior is normal. Judgment and thought content normal.    Vital signs in last 24 hours:    Labs:   Estimated body mass index is 24.26 kg/(m^2) as calculated from the following:   Height as of 06/28/14: 5' 4.5" (1.638 m).   Weight as of 06/18/14: 65.1 kg (143 lb 8.3 oz).   Imaging Review Plain  radiographs demonstrate right knee does have end-stage arthritis of the medial compartment.  Assessment/Plan:  End stage arthritis, right knee   The patient history, physical examination, clinical judgment of the provider and imaging studies are consistent with end stage degenerative joint disease of the right knee(s) and total knee arthroplasty is deemed medically necessary. The treatment options including medical management, injection therapy arthroscopy and arthroplasty were discussed at length. The risks and benefits of total knee arthroplasty were presented and reviewed. The risks due to aseptic loosening, infection, stiffness, patella tracking problems, thromboembolic complications and other imponderables were discussed. The patient acknowledged the explanation, agreed to proceed with the plan and consent was signed. Patient is being admitted for inpatient treatment for surgery, pain control, PT, OT, prophylactic antibiotics, VTE prophylaxis, progressive ambulation and ADL's and discharge planning. The patient is planning to be discharged home with home health services

## 2014-08-10 NOTE — Anesthesia Preprocedure Evaluation (Addendum)
Anesthesia Evaluation  Patient identified by MRN, date of birth, ID band Patient awake    Reviewed: Allergy & Precautions, H&P , NPO status , Patient's Chart, lab work & pertinent test results, reviewed documented beta blocker date and time   History of Anesthesia Complications (+) PONV  Airway Mallampati: II   Neck ROM: Full    Dental  (+) Teeth Intact   Pulmonary  breath sounds clear to auscultation        Cardiovascular Rhythm:Regular  EKG wnl   Neuro/Psych Depression    GI/Hepatic   Endo/Other    Renal/GU      Musculoskeletal  (+) Arthritis -,   Abdominal (+)  Abdomen: soft.    Peds  Hematology   Anesthesia Other Findings   Reproductive/Obstetrics                            Anesthesia Physical Anesthesia Plan  ASA: III  Anesthesia Plan: General   Post-op Pain Management: MAC Combined w/ Regional for Post-op pain   Induction: Intravenous  Airway Management Planned: LMA and Oral ETT  Additional Equipment:   Intra-op Plan:   Post-operative Plan:   Informed Consent: I have reviewed the patients History and Physical, chart, labs and discussed the procedure including the risks, benefits and alternatives for the proposed anesthesia with the patient or authorized representative who has indicated his/her understanding and acceptance.     Plan Discussed with:   Anesthesia Plan Comments:         Anesthesia Quick Evaluation

## 2014-08-11 ENCOUNTER — Inpatient Hospital Stay (HOSPITAL_COMMUNITY): Payer: 59 | Admitting: Anesthesiology

## 2014-08-11 ENCOUNTER — Encounter (HOSPITAL_COMMUNITY): Payer: Self-pay | Admitting: *Deleted

## 2014-08-11 ENCOUNTER — Inpatient Hospital Stay (HOSPITAL_COMMUNITY)
Admission: RE | Admit: 2014-08-11 | Discharge: 2014-08-13 | DRG: 470 | Disposition: A | Discharge status: Home Health | Payer: 59 | Source: Ambulatory Visit | Attending: Orthopedic Surgery | Admitting: Orthopedic Surgery

## 2014-08-11 ENCOUNTER — Encounter (HOSPITAL_COMMUNITY)
Admission: RE | Disposition: A | Discharge status: Home Health | Payer: Self-pay | Source: Ambulatory Visit | Attending: Orthopedic Surgery

## 2014-08-11 DIAGNOSIS — E780 Pure hypercholesterolemia: Secondary | ICD-10-CM | POA: Diagnosis present

## 2014-08-11 DIAGNOSIS — Z882 Allergy status to sulfonamides status: Secondary | ICD-10-CM

## 2014-08-11 DIAGNOSIS — M1710 Unilateral primary osteoarthritis, unspecified knee: Secondary | ICD-10-CM | POA: Diagnosis present

## 2014-08-11 DIAGNOSIS — M1711 Unilateral primary osteoarthritis, right knee: Principal | ICD-10-CM | POA: Diagnosis present

## 2014-08-11 DIAGNOSIS — Z886 Allergy status to analgesic agent status: Secondary | ICD-10-CM | POA: Diagnosis not present

## 2014-08-11 DIAGNOSIS — G4700 Insomnia, unspecified: Secondary | ICD-10-CM | POA: Diagnosis present

## 2014-08-11 DIAGNOSIS — Z7901 Long term (current) use of anticoagulants: Secondary | ICD-10-CM | POA: Diagnosis not present

## 2014-08-11 DIAGNOSIS — Z885 Allergy status to narcotic agent status: Secondary | ICD-10-CM

## 2014-08-11 DIAGNOSIS — Z79899 Other long term (current) drug therapy: Secondary | ICD-10-CM

## 2014-08-11 DIAGNOSIS — Z888 Allergy status to other drugs, medicaments and biological substances status: Secondary | ICD-10-CM

## 2014-08-11 DIAGNOSIS — E785 Hyperlipidemia, unspecified: Secondary | ICD-10-CM | POA: Diagnosis present

## 2014-08-11 DIAGNOSIS — Z91018 Allergy to other foods: Secondary | ICD-10-CM

## 2014-08-11 DIAGNOSIS — F329 Major depressive disorder, single episode, unspecified: Secondary | ICD-10-CM | POA: Diagnosis present

## 2014-08-11 DIAGNOSIS — Z96642 Presence of left artificial hip joint: Secondary | ICD-10-CM | POA: Diagnosis present

## 2014-08-11 DIAGNOSIS — M25561 Pain in right knee: Secondary | ICD-10-CM | POA: Diagnosis present

## 2014-08-11 DIAGNOSIS — D62 Acute posthemorrhagic anemia: Secondary | ICD-10-CM | POA: Diagnosis not present

## 2014-08-11 DIAGNOSIS — K5900 Constipation, unspecified: Secondary | ICD-10-CM | POA: Diagnosis present

## 2014-08-11 HISTORY — PX: TOTAL KNEE ARTHROPLASTY: SHX125

## 2014-08-11 LAB — TYPE AND SCREEN
ABO/RH(D): A NEG
ANTIBODY SCREEN: NEGATIVE

## 2014-08-11 SURGERY — ARTHROPLASTY, KNEE, TOTAL
Anesthesia: General | Site: Knee | Laterality: Right

## 2014-08-11 MED ORDER — HYDROXYZINE HCL 25 MG PO TABS
25.0000 mg | ORAL_TABLET | Freq: Every day | ORAL | Status: DC
  Administered 2014-08-11 – 2014-08-12 (×2): 25 mg via ORAL
  Filled 2014-08-11 (×2): qty 1

## 2014-08-11 MED ORDER — ONDANSETRON HCL 4 MG/2ML IJ SOLN
4.0000 mg | Freq: Four times a day (QID) | INTRAMUSCULAR | Status: DC | PRN
  Administered 2014-08-11 – 2014-08-13 (×3): 4 mg via INTRAVENOUS
  Filled 2014-08-11 (×3): qty 2

## 2014-08-11 MED ORDER — CELECOXIB 200 MG PO CAPS
ORAL_CAPSULE | ORAL | Status: AC
  Administered 2014-08-11: 25 mg
  Filled 2014-08-11: qty 1

## 2014-08-11 MED ORDER — SODIUM CHLORIDE 0.9 % IJ SOLN
INTRAMUSCULAR | Status: DC | PRN
Start: 2014-08-11 — End: 2014-08-11
  Administered 2014-08-11: 40 mL

## 2014-08-11 MED ORDER — HYDROMORPHONE HCL 1 MG/ML IJ SOLN
0.5000 mg | INTRAMUSCULAR | Status: DC | PRN
  Administered 2014-08-11 – 2014-08-13 (×5): 1 mg via INTRAVENOUS
  Filled 2014-08-11 (×5): qty 1

## 2014-08-11 MED ORDER — LORATADINE 10 MG PO TABS
10.0000 mg | ORAL_TABLET | Freq: Every day | ORAL | Status: DC
  Administered 2014-08-11 – 2014-08-13 (×3): 10 mg via ORAL
  Filled 2014-08-11 (×3): qty 1

## 2014-08-11 MED ORDER — LACTATED RINGERS IV SOLN
INTRAVENOUS | Status: DC
  Administered 2014-08-11: 14:00:00 via INTRAVENOUS

## 2014-08-11 MED ORDER — ONDANSETRON HCL 4 MG/2ML IJ SOLN
INTRAMUSCULAR | Status: DC | PRN
  Administered 2014-08-11: 4 mg via INTRAVENOUS

## 2014-08-11 MED ORDER — BUPIVACAINE LIPOSOME 1.3 % IJ SUSP
INTRAMUSCULAR | Status: DC | PRN
  Administered 2014-08-11: 20 mL

## 2014-08-11 MED ORDER — NEOSTIGMINE METHYLSULFATE 10 MG/10ML IV SOLN
INTRAVENOUS | Status: DC | PRN
  Administered 2014-08-11: 3 mg via INTRAVENOUS

## 2014-08-11 MED ORDER — ROCURONIUM BROMIDE 100 MG/10ML IV SOLN
INTRAVENOUS | Status: DC | PRN
  Administered 2014-08-11: 40 mg via INTRAVENOUS

## 2014-08-11 MED ORDER — LIDOCAINE HCL (CARDIAC) 20 MG/ML IV SOLN
INTRAVENOUS | Status: DC | PRN
  Administered 2014-08-11: 80 mg via INTRAVENOUS

## 2014-08-11 MED ORDER — PROPOFOL 10 MG/ML IV BOLUS
INTRAVENOUS | Status: DC | PRN
  Administered 2014-08-11: 150 mg via INTRAVENOUS

## 2014-08-11 MED ORDER — TRANEXAMIC ACID 100 MG/ML IV SOLN
1000.0000 mg | INTRAVENOUS | Status: AC
  Administered 2014-08-11: 1000 mg via INTRAVENOUS
  Filled 2014-08-11: qty 10

## 2014-08-11 MED ORDER — PHENYLEPHRINE HCL 10 MG/ML IJ SOLN
INTRAMUSCULAR | Status: DC | PRN
  Administered 2014-08-11 (×3): 80 ug via INTRAVENOUS

## 2014-08-11 MED ORDER — METHOCARBAMOL 500 MG PO TABS
ORAL_TABLET | ORAL | Status: AC
  Filled 2014-08-11: qty 1

## 2014-08-11 MED ORDER — SENNOSIDES-DOCUSATE SODIUM 8.6-50 MG PO TABS: 1.0000 | ORAL_TABLET | Freq: Every evening | ORAL | Status: DC | PRN

## 2014-08-11 MED ORDER — HYDROMORPHONE HCL 2 MG PO TABS
2.0000 mg | ORAL_TABLET | ORAL | Status: DC | PRN
  Administered 2014-08-11 – 2014-08-12 (×4): 2 mg via ORAL
  Administered 2014-08-12: 4 mg via ORAL
  Administered 2014-08-12 (×2): 2 mg via ORAL
  Administered 2014-08-13 (×4): 4 mg via ORAL
  Filled 2014-08-11: qty 2
  Filled 2014-08-11: qty 1
  Filled 2014-08-11: qty 2
  Filled 2014-08-11: qty 1
  Filled 2014-08-11 (×2): qty 2
  Filled 2014-08-11 (×3): qty 1
  Filled 2014-08-11: qty 2
  Filled 2014-08-11: qty 1

## 2014-08-11 MED ORDER — BISACODYL 5 MG PO TBEC: 5.0000 mg | DELAYED_RELEASE_TABLET | Freq: Every day | ORAL | Status: DC | PRN

## 2014-08-11 MED ORDER — FENTANYL CITRATE 0.05 MG/ML IJ SOLN
25.0000 ug | INTRAMUSCULAR | Status: DC | PRN
  Administered 2014-08-11 (×4): 25 ug via INTRAVENOUS
  Administered 2014-08-11: 50 ug via INTRAVENOUS

## 2014-08-11 MED ORDER — FENTANYL CITRATE 0.05 MG/ML IJ SOLN
INTRAMUSCULAR | Status: AC
  Filled 2014-08-11: qty 2

## 2014-08-11 MED ORDER — ACETAMINOPHEN 325 MG PO TABS: 650.0000 mg | ORAL_TABLET | Freq: Four times a day (QID) | ORAL | Status: DC | PRN

## 2014-08-11 MED ORDER — SCOPOLAMINE 1 MG/3DAYS TD PT72
1.0000 | MEDICATED_PATCH | TRANSDERMAL | Status: DC
  Administered 2014-08-11: 1.5 mg via TRANSDERMAL

## 2014-08-11 MED ORDER — MEPERIDINE HCL 25 MG/ML IJ SOLN: 6.2500 mg | INTRAMUSCULAR | Status: DC | PRN

## 2014-08-11 MED ORDER — DIPHENHYDRAMINE HCL 12.5 MG/5ML PO ELIX
12.5000 mg | ORAL_SOLUTION | ORAL | Status: DC | PRN
  Administered 2014-08-12 – 2014-08-13 (×4): 25 mg via ORAL
  Filled 2014-08-11 (×4): qty 10

## 2014-08-11 MED ORDER — 0.9 % SODIUM CHLORIDE (POUR BTL) OPTIME
TOPICAL | Status: DC | PRN
  Administered 2014-08-11: 1000 mL

## 2014-08-11 MED ORDER — CEFUROXIME SODIUM 1.5 G IJ SOLR
INTRAMUSCULAR | Status: DC | PRN
  Administered 2014-08-11: 1.5 g

## 2014-08-11 MED ORDER — SCOPOLAMINE 1 MG/3DAYS TD PT72
MEDICATED_PATCH | TRANSDERMAL | Status: AC
  Administered 2014-08-11: 1.5 mg via TRANSDERMAL
  Filled 2014-08-11: qty 1

## 2014-08-11 MED ORDER — MIDAZOLAM HCL 5 MG/5ML IJ SOLN
INTRAMUSCULAR | Status: DC | PRN
  Administered 2014-08-11: 2 mg via INTRAVENOUS

## 2014-08-11 MED ORDER — MIDAZOLAM HCL 2 MG/2ML IJ SOLN
INTRAMUSCULAR | Status: AC
  Filled 2014-08-11: qty 2

## 2014-08-11 MED ORDER — FLEET ENEMA 7-19 GM/118ML RE ENEM: 1.0000 | ENEMA | Freq: Once | RECTAL | Status: AC | PRN

## 2014-08-11 MED ORDER — SODIUM CHLORIDE 0.9 % IR SOLN
Status: DC | PRN
  Administered 2014-08-11: 3000 mL

## 2014-08-11 MED ORDER — PHENYLEPHRINE HCL 10 MG/ML IJ SOLN
10.0000 mg | INTRAVENOUS | Status: DC | PRN
  Administered 2014-08-11: 25 ug/min via INTRAVENOUS

## 2014-08-11 MED ORDER — ALUM & MAG HYDROXIDE-SIMETH 200-200-20 MG/5ML PO SUSP: 30.0000 mL | ORAL | Status: DC | PRN

## 2014-08-11 MED ORDER — FENTANYL CITRATE 0.05 MG/ML IJ SOLN
INTRAMUSCULAR | Status: AC
  Administered 2014-08-11: 100 ug via INTRAVENOUS
  Filled 2014-08-11: qty 2

## 2014-08-11 MED ORDER — LACTATED RINGERS IV SOLN
INTRAVENOUS | Status: DC | PRN
  Administered 2014-08-11 (×2): via INTRAVENOUS

## 2014-08-11 MED ORDER — VENLAFAXINE HCL ER 150 MG PO CP24
150.0000 mg | ORAL_CAPSULE | Freq: Every day | ORAL | Status: DC
Start: 2014-08-12 — End: 2014-08-13
  Administered 2014-08-12 – 2014-08-13 (×2): 150 mg via ORAL
  Filled 2014-08-11 (×2): qty 1

## 2014-08-11 MED ORDER — METOCLOPRAMIDE HCL 5 MG/ML IJ SOLN
5.0000 mg | Freq: Three times a day (TID) | INTRAMUSCULAR | Status: DC | PRN
  Administered 2014-08-12: 10 mg via INTRAVENOUS
  Filled 2014-08-11: qty 2

## 2014-08-11 MED ORDER — BUPIVACAINE LIPOSOME 1.3 % IJ SUSP
20.0000 mL | Freq: Once | INTRAMUSCULAR | Status: DC
  Filled 2014-08-11: qty 20

## 2014-08-11 MED ORDER — MELATONIN 5 MG PO CAPS: 5.0000 mg | ORAL_CAPSULE | Freq: Every day | ORAL | Status: DC

## 2014-08-11 MED ORDER — HYDROXYZINE PAMOATE 25 MG PO CAPS
25.0000 mg | ORAL_CAPSULE | Freq: Every day | ORAL | Status: DC
  Filled 2014-08-11: qty 1

## 2014-08-11 MED ORDER — METOCLOPRAMIDE HCL 10 MG PO TABS: 5.0000 mg | ORAL_TABLET | Freq: Three times a day (TID) | ORAL | Status: DC | PRN

## 2014-08-11 MED ORDER — FENTANYL CITRATE 0.05 MG/ML IJ SOLN
50.0000 ug | INTRAMUSCULAR | Status: DC | PRN
  Administered 2014-08-11: 100 ug via INTRAVENOUS

## 2014-08-11 MED ORDER — METHOCARBAMOL 1000 MG/10ML IJ SOLN
500.0000 mg | Freq: Four times a day (QID) | INTRAVENOUS | Status: DC | PRN
  Filled 2014-08-11: qty 5

## 2014-08-11 MED ORDER — PRAVASTATIN SODIUM 40 MG PO TABS
40.0000 mg | ORAL_TABLET | Freq: Every day | ORAL | Status: DC
  Administered 2014-08-11 – 2014-08-12 (×2): 40 mg via ORAL
  Filled 2014-08-11 (×3): qty 1

## 2014-08-11 MED ORDER — MIDAZOLAM HCL 2 MG/2ML IJ SOLN
1.0000 mg | INTRAMUSCULAR | Status: DC | PRN
  Administered 2014-08-11: 2 mg via INTRAVENOUS

## 2014-08-11 MED ORDER — DOCUSATE SODIUM 100 MG PO CAPS
100.0000 mg | ORAL_CAPSULE | Freq: Two times a day (BID) | ORAL | Status: DC
  Administered 2014-08-11 – 2014-08-13 (×4): 100 mg via ORAL
  Filled 2014-08-11 (×5): qty 1

## 2014-08-11 MED ORDER — CIPROFLOXACIN HCL 250 MG PO TABS
250.0000 mg | ORAL_TABLET | Freq: Every day | ORAL | Status: DC
  Administered 2014-08-12 – 2014-08-13 (×2): 250 mg via ORAL
  Filled 2014-08-11 (×3): qty 1

## 2014-08-11 MED ORDER — FENTANYL CITRATE 0.05 MG/ML IJ SOLN
INTRAMUSCULAR | Status: AC
  Filled 2014-08-11: qty 5

## 2014-08-11 MED ORDER — KCL IN DEXTROSE-NACL 20-5-0.45 MEQ/L-%-% IV SOLN
INTRAVENOUS | Status: DC
  Administered 2014-08-11 – 2014-08-12 (×2): via INTRAVENOUS
  Filled 2014-08-11 (×7): qty 1000

## 2014-08-11 MED ORDER — MIDAZOLAM HCL 2 MG/2ML IJ SOLN
INTRAMUSCULAR | Status: AC
  Administered 2014-08-11: 2 mg via INTRAVENOUS
  Filled 2014-08-11: qty 2

## 2014-08-11 MED ORDER — FOLIC ACID 1 MG PO TABS
1.0000 mg | ORAL_TABLET | Freq: Every day | ORAL | Status: DC
  Administered 2014-08-11 – 2014-08-13 (×3): 1 mg via ORAL
  Filled 2014-08-11 (×3): qty 1

## 2014-08-11 MED ORDER — PHENOL 1.4 % MT LIQD: 1.0000 | OROMUCOSAL | Status: DC | PRN

## 2014-08-11 MED ORDER — GLYCOPYRROLATE 0.2 MG/ML IJ SOLN
INTRAMUSCULAR | Status: DC | PRN
  Administered 2014-08-11: .4 mg via INTRAVENOUS

## 2014-08-11 MED ORDER — CEFUROXIME SODIUM 1.5 G IJ SOLR
INTRAMUSCULAR | Status: AC
  Filled 2014-08-11: qty 1.5

## 2014-08-11 MED ORDER — PROMETHAZINE HCL 25 MG/ML IJ SOLN: 6.2500 mg | INTRAMUSCULAR | Status: DC | PRN

## 2014-08-11 MED ORDER — BUPIVACAINE-EPINEPHRINE (PF) 0.5% -1:200000 IJ SOLN
INTRAMUSCULAR | Status: DC | PRN
  Administered 2014-08-11: 30 mL via PERINEURAL

## 2014-08-11 MED ORDER — ACETAMINOPHEN 650 MG RE SUPP: 650.0000 mg | Freq: Four times a day (QID) | RECTAL | Status: DC | PRN

## 2014-08-11 MED ORDER — CELECOXIB 200 MG PO CAPS
200.0000 mg | ORAL_CAPSULE | Freq: Two times a day (BID) | ORAL | Status: DC
  Administered 2014-08-11 – 2014-08-13 (×4): 200 mg via ORAL
  Filled 2014-08-11 (×4): qty 1

## 2014-08-11 MED ORDER — RIVAROXABAN 10 MG PO TABS
10.0000 mg | ORAL_TABLET | Freq: Every day | ORAL | Status: DC
  Administered 2014-08-12 – 2014-08-13 (×2): 10 mg via ORAL
  Filled 2014-08-11 (×3): qty 1

## 2014-08-11 MED ORDER — ONDANSETRON HCL 4 MG PO TABS: 4.0000 mg | ORAL_TABLET | Freq: Four times a day (QID) | ORAL | Status: DC | PRN

## 2014-08-11 MED ORDER — METHOCARBAMOL 500 MG PO TABS
500.0000 mg | ORAL_TABLET | Freq: Four times a day (QID) | ORAL | Status: DC | PRN
  Administered 2014-08-11 – 2014-08-13 (×4): 500 mg via ORAL
  Filled 2014-08-11 (×3): qty 1

## 2014-08-11 MED ORDER — FENTANYL CITRATE 0.05 MG/ML IJ SOLN
INTRAMUSCULAR | Status: DC | PRN
  Administered 2014-08-11 (×5): 50 ug via INTRAVENOUS

## 2014-08-11 MED ORDER — PROPOFOL 10 MG/ML IV BOLUS
INTRAVENOUS | Status: AC
  Filled 2014-08-11: qty 20

## 2014-08-11 MED ORDER — MENTHOL 3 MG MT LOZG: 1.0000 | LOZENGE | OROMUCOSAL | Status: DC | PRN

## 2014-08-11 SURGICAL SUPPLY — 63 items
BANDAGE ESMARK 6X9 LF (GAUZE/BANDAGES/DRESSINGS) ×1 IMPLANT
BLADE SAG 18X100X1.27 (BLADE) ×3 IMPLANT
BLADE SAW SGTL 13X75X1.27 (BLADE) ×3 IMPLANT
BLADE SURG ROTATE 9660 (MISCELLANEOUS) IMPLANT
BNDG CMPR 9X6 STRL LF SNTH (GAUZE/BANDAGES/DRESSINGS) ×1
BNDG CMPR MED 10X6 ELC LF (GAUZE/BANDAGES/DRESSINGS) ×1
BNDG ELASTIC 6X10 VLCR STRL LF (GAUZE/BANDAGES/DRESSINGS) ×3 IMPLANT
BNDG ESMARK 6X9 LF (GAUZE/BANDAGES/DRESSINGS) ×3
BOWL SMART MIX CTS (DISPOSABLE) ×3 IMPLANT
CAPT KNEE TOTAL 3 ATTUNE ×2 IMPLANT
CEMENT HV SMART SET (Cement) ×6 IMPLANT
COVER SURGICAL LIGHT HANDLE (MISCELLANEOUS) ×3 IMPLANT
CUFF TOURNIQUET SINGLE 34IN LL (TOURNIQUET CUFF) ×2 IMPLANT
CUFF TOURNIQUET SINGLE 44IN (TOURNIQUET CUFF) IMPLANT
DRAPE EXTREMITY T 121X128X90 (DRAPE) ×3 IMPLANT
DRAPE IMP U-DRAPE 54X76 (DRAPES) ×3 IMPLANT
DRAPE U-SHAPE 47X51 STRL (DRAPES) ×3 IMPLANT
DURAPREP 26ML APPLICATOR (WOUND CARE) ×4 IMPLANT
ELECT REM PT RETURN 9FT ADLT (ELECTROSURGICAL) ×3
ELECTRODE REM PT RTRN 9FT ADLT (ELECTROSURGICAL) ×1 IMPLANT
EVACUATOR 1/8 PVC DRAIN (DRAIN) ×3 IMPLANT
GAUZE SPONGE 4X4 12PLY STRL (GAUZE/BANDAGES/DRESSINGS) ×6 IMPLANT
GAUZE XEROFORM 1X8 LF (GAUZE/BANDAGES/DRESSINGS) ×3 IMPLANT
GLOVE BIO SURGEON STRL SZ7.5 (GLOVE) ×3 IMPLANT
GLOVE BIO SURGEON STRL SZ8.5 (GLOVE) ×3 IMPLANT
GLOVE BIOGEL PI IND STRL 8 (GLOVE) ×1 IMPLANT
GLOVE BIOGEL PI IND STRL 9 (GLOVE) ×1 IMPLANT
GLOVE BIOGEL PI INDICATOR 8 (GLOVE) ×2
GLOVE BIOGEL PI INDICATOR 9 (GLOVE) ×2
GOWN STRL REUS W/ TWL LRG LVL3 (GOWN DISPOSABLE) ×1 IMPLANT
GOWN STRL REUS W/ TWL XL LVL3 (GOWN DISPOSABLE) ×2 IMPLANT
GOWN STRL REUS W/TWL LRG LVL3 (GOWN DISPOSABLE) ×3
GOWN STRL REUS W/TWL XL LVL3 (GOWN DISPOSABLE) ×6
HANDPIECE INTERPULSE COAX TIP (DISPOSABLE) ×3
HOOD PEEL AWAY FACE SHEILD DIS (HOOD) ×8 IMPLANT
KIT BASIN OR (CUSTOM PROCEDURE TRAY) ×3 IMPLANT
KIT ROOM TURNOVER OR (KITS) ×3 IMPLANT
MANIFOLD NEPTUNE II (INSTRUMENTS) ×3 IMPLANT
NDL SAFETY ECLIPSE 18X1.5 (NEEDLE) IMPLANT
NDL SPNL 18GX3.5 QUINCKE PK (NEEDLE) IMPLANT
NEEDLE 22X1 1/2 (OR ONLY) (NEEDLE) ×3 IMPLANT
NEEDLE HYPO 18GX1.5 SHARP (NEEDLE)
NEEDLE SPNL 18GX3.5 QUINCKE PK (NEEDLE) IMPLANT
NS IRRIG 1000ML POUR BTL (IV SOLUTION) ×3 IMPLANT
PACK TOTAL JOINT (CUSTOM PROCEDURE TRAY) ×3 IMPLANT
PACK UNIVERSAL I (CUSTOM PROCEDURE TRAY) ×3 IMPLANT
PAD ARMBOARD 7.5X6 YLW CONV (MISCELLANEOUS) ×6 IMPLANT
PADDING CAST COTTON 6X4 STRL (CAST SUPPLIES) ×3 IMPLANT
SET HNDPC FAN SPRY TIP SCT (DISPOSABLE) ×1 IMPLANT
STAPLER VISISTAT (STAPLE) ×2 IMPLANT
STAPLER VISISTAT 35W (STAPLE) ×3 IMPLANT
SUCTION FRAZIER TIP 10 FR DISP (SUCTIONS) ×3 IMPLANT
SUT VIC AB 0 CTX 36 (SUTURE) ×3
SUT VIC AB 0 CTX36XBRD ANTBCTR (SUTURE) ×1 IMPLANT
SUT VIC AB 1 CTX 36 (SUTURE) ×3
SUT VIC AB 1 CTX36XBRD ANBCTR (SUTURE) ×1 IMPLANT
SUT VIC AB 2-0 CT1 27 (SUTURE) ×3
SUT VIC AB 2-0 CT1 TAPERPNT 27 (SUTURE) ×1 IMPLANT
SYR 30ML LL (SYRINGE) ×3 IMPLANT
SYR 50ML LL SCALE MARK (SYRINGE) ×3 IMPLANT
TOWEL OR 17X24 6PK STRL BLUE (TOWEL DISPOSABLE) ×3 IMPLANT
TOWEL OR 17X26 10 PK STRL BLUE (TOWEL DISPOSABLE) ×3 IMPLANT
WATER STERILE IRR 1000ML POUR (IV SOLUTION) ×2 IMPLANT

## 2014-08-11 NOTE — Interval H&P Note (Signed)
History and Physical Interval Note:  08/11/2014 2:07 PM  Adrienne Campbell  has presented today for surgery, with the diagnosis of RIGHT KNEE OSTEOARTHRITIS  The various methods of treatment have been discussed with the patient and family. After consideration of risks, benefits and other options for treatment, the patient has consented to  Procedure(s): RIGHT TOTAL KNEE ARTHROPLASTY (Right) as a surgical intervention .  The patient's history has been reviewed, patient examined, no change in status, stable for surgery.  I have reviewed the patient's chart and labs.  Questions were answered to the patient's satisfaction.     Kerin Salen

## 2014-08-11 NOTE — Transfer of Care (Signed)
Immediate Anesthesia Transfer of Care Note  Patient: Adrienne Campbell  Procedure(s) Performed: Procedure(s): RIGHT TOTAL KNEE ARTHROPLASTY (Right)  Patient Location: PACU  Anesthesia Type:General  Level of Consciousness: awake, alert  and oriented  Airway & Oxygen Therapy: Patient Spontanous Breathing  Post-op Assessment: Report given to PACU RN  Post vital signs: Reviewed and stable  Complications: No apparent anesthesia complications

## 2014-08-11 NOTE — Op Note (Signed)
PATIENT ID:      Adrienne Campbell  MRN:     [id number] DOB/AGE:    December 30, 1960 / 53 y.o.       OPERATIVE REPORT    DATE OF PROCEDURE:  08/11/2014       PREOPERATIVE DIAGNOSIS:   RIGHT KNEE OSTEOARTHRITIS      Estimated body mass index is 24.79 kg/(m^2) as calculated from the following:   Height as of 08/03/14: 5\' 5"  (1.651 m).   Weight as of this encounter: 67.586 kg (149 lb).                                                        POSTOPERATIVE DIAGNOSIS:   RIGHT KNEE OSTEOARTHRITIS                                                                      PROCEDURE:  Procedure(s): RIGHT TOTAL KNEE ARTHROPLASTY Using DepuyAttune RP implants #4R Femur, #4Tibia, 77mm Attune RP bearing, 35 Patella     SURGEON: Kristene Liberati J    ASSISTANT:   [employer]   (Present and scrubbed throughout the case, critical for assistance with exposure, retraction, instrumentation, and closure.)         ANESTHESIA: GET, ACB, Exparel  DRAINS: 2 medium hemovac in knee   TOURNIQUET TIME: 29JJO   COMPLICATIONS:  None     SPECIMENS: None   INDICATIONS FOR PROCEDURE: The patient has  RIGHT KNEE OSTEOARTHRITIS, varus deformities, XR shows bone on bone arthritis. Patient has failed all conservative measures including anti-inflammatory medicines, narcotics, attempts at  exercise and weight loss, cortisone injections and viscosupplementation.  Risks and benefits of surgery have been discussed, questions answered.   DESCRIPTION OF PROCEDURE: The patient identified by armband, received  IV antibiotics, in the holding area at [facility]. Patient taken to the operating room, appropriate anesthetic  monitors were attached, and general endotracheal anesthesia induced with  the patient in supine position, Foley catheter was inserted. Tourniquet  applied high to the operative thigh. Lateral post and foot positioner  applied to the table, the lower extremity was then prepped and draped  in usual sterile  fashion from the ankle to the tourniquet. Time-out procedure was performed. The limb was wrapped with an Esmarch bandage and the tourniquet inflated to 350 mmHg. We began the operation by making the anterior midline incision starting at handbreadth above the patella going over the patella 1 cm medial to and  4 cm distal to the tibial tubercle. Small bleeders in the skin and the  subcutaneous tissue identified and cauterized. Transverse retinaculum was incised and reflected medially and a medial parapatellar arthrotomy was accomplished. the patella was everted and theprepatellar fat pad resected. The superficial medial collateral  ligament was then elevated from anterior to posterior along the proximal  flare of the tibia and anterior half of the menisci resected. The knee was hyperflexed exposing bone on bone arthritis. Peripheral and notch osteophytes as well as the cruciate ligaments were then resected. We continued to  work our way around posteriorly along  the proximal tibia, and externally  rotated the tibia subluxing it out from underneath the femur. A McHale  retractor was placed through the notch and a lateral Hohmann retractor  placed, and we then drilled through the proximal tibia in line with the  axis of the tibia followed by an intramedullary guide rod and 2-degree  posterior slope cutting guide. The tibial cutting guide was pinned into place  allowing resection of 4 mm of bone medially and about 10 mm of bone  laterally because of her varus deformity. Satisfied with the tibial resection, we then  entered the distal femur 2 mm anterior to the PCL origin with the  intramedullary guide rod and applied the distal femoral cutting guide  set at 14mm, with 5 degrees of valgus. This was pinned along the  epicondylar axis. At this point, the distal femoral cut was accomplished without difficulty. We then sized for a #4R femoral component and pinned the guide in 3 degrees of external rotation.The  chamfer cutting guide was pinned into place. The anterior, posterior, and chamfer cuts were accomplished without difficulty followed by  the Attune RP box cutting guide and the box cut. We also removed posterior osteophytes from the posterior femoral condyles. At this  time, the knee was brought into full extension. We checked our  extension and flexion gaps and found them symmetric at 51mm.  The patella thickness measured at 24 mm. We set the cutting guide at 15 and removed the posterior 9 mm  of the patella, sized for a 35 button and drilled the lollipop. The knee  was then once again hyperflexed exposing the proximal tibia. We sized for a #4R tibial base plate, applied the smokestack and the conical reamer followed by the the Delta fin keel punch. We then hammered into place the Attune RP trial femoral component, inserted a 10-mm trial bearing, trial patellar button, and took the knee through range of motion from 0-130 degrees. No thumb pressure was required for patellar  tracking. At this point, all trial components were removed, a double batch of DePuy HV cement with 1500 mg of Zinacef was mixed and applied to all bony metallic mating surfaces except for the posterior condyles of the femur itself. In order, we  hammered into place the tibial tray and removed excess cement, the femoral component and removed excess cement, a 10-mm Sigma RP bearing  was inserted, and the knee brought to full extension with compression.  The patellar button was clamped into place, and excess cement  removed. While the cement cured the wound was irrigated out with normal saline solution pulse lavage, and medium Hemovac drains were placed from an anterolateral  approach. Ligament stability and patellar tracking were checked and found to be excellent. The parapatellar arthrotomy was closed with  running #1 Vicryl suture. The subcutaneous tissue with 0 and 2-0 undyed  Vicryl suture, and the skin with skin staples. A  dressing of Xeroform,  4 x 4, dressing sponges, Webril, and Ace wrap applied. The patient  awakened, extubated, and taken to recovery room without difficulty.   Kerin Salen 08/11/2014, 4:37 PM

## 2014-08-11 NOTE — Progress Notes (Signed)
CRNA at bedside.

## 2014-08-11 NOTE — Anesthesia Procedure Notes (Signed)
Anesthesia Regional Block:  Adductor canal block  Pre-Anesthetic Checklist: ,, timeout performed, Correct Patient, Correct Site, Correct Laterality, Correct Procedure, Correct Position, site marked, Risks and benefits discussed,  Surgical consent,  Pre-op evaluation,  At surgeon's request and post-op pain management   Prep: Maximum Sterile Barrier Precautions used and chloraprep       Needles:  Injection technique: Single-shot  Needle Type: Echogenic Stimulator Needle     Needle Length: 10cm 10 cm Needle Gauge: 22 and 22 G    Additional Needles:  Procedures: ultrasound guided (picture in chart) Adductor canal block Narrative:  Injection made incrementally with aspirations every 5 mL. Anesthesiologist: Alexis Frock  Additional Notes: R AC block with US guidance, 67ml marcaine with epi .5%, multiple asp, talked to patient thoroughout procedure

## 2014-08-11 NOTE — Anesthesia Postprocedure Evaluation (Signed)
  Anesthesia Post-op Note  Patient: Adrienne Campbell  Procedure(s) Performed: Procedure(s): RIGHT TOTAL KNEE ARTHROPLASTY (Right)  Patient Location: PACU  Anesthesia Type:General  Level of Consciousness: awake  Airway and Oxygen Therapy: Patient Spontanous Breathing and Patient connected to nasal cannula oxygen  Post-op Pain: mild  Post-op Assessment: Post-op Vital signs reviewed, Patient's Cardiovascular Status Stable, Respiratory Function Stable and Patent Airway  Post-op Vital Signs: Reviewed and stable  Last Vitals:  Filed Vitals:   08/11/14 1500  BP: 146/63  Pulse: 86  Temp:   Resp: 16    Complications: No apparent anesthesia complications

## 2014-08-11 NOTE — Progress Notes (Signed)
Orthopedic Tech Progress Note Patient Details:  Adrienne Campbell Jan 08, 1961 [id number] Applied CPM to RLE.  Applied OHF with trapeze to pt.'s bed.  Left Bone Foam with pt.'s nurse. CPM Right Knee CPM Right Knee: On Right Knee Flexion (Degrees): 40 Right Knee Extension (Degrees): 10   Darrol Poke 08/11/2014, 7:29 PM

## 2014-08-12 LAB — CBC
HCT: 32.7 % — ABNORMAL LOW (ref 36.0–46.0)
Hemoglobin: 10.7 g/dL — ABNORMAL LOW (ref 12.0–15.0)
MCH: 32.5 pg (ref 26.0–34.0)
MCHC: 32.7 g/dL (ref 30.0–36.0)
MCV: 99.4 fL (ref 78.0–100.0)
Platelets: 331 10*3/uL (ref 150–400)
RBC: 3.29 MIL/uL — ABNORMAL LOW (ref 3.87–5.11)
RDW: 13.9 % (ref 11.5–15.5)
WBC: 10.3 10*3/uL (ref 4.0–10.5)

## 2014-08-12 LAB — BASIC METABOLIC PANEL
Anion gap: 11 (ref 5–15)
BUN: 8 mg/dL (ref 6–23)
CO2: 24 mEq/L (ref 19–32)
Calcium: 8.7 mg/dL (ref 8.4–10.5)
Chloride: 93 mEq/L — ABNORMAL LOW (ref 96–112)
Creatinine, Ser: 0.59 mg/dL (ref 0.50–1.10)
GFR calc Af Amer: >90 mL/min (ref >90)
Glucose, Bld: 134 mg/dL — ABNORMAL HIGH (ref 70–99)
Potassium: 4.6 mEq/L (ref 3.7–5.3)
Sodium: 128 mEq/L — ABNORMAL LOW (ref 137–147)

## 2014-08-12 NOTE — Plan of Care (Signed)
Problem: Phase I Progression Outcomes Goal: CMS/Neurovascular status WDL Outcome: Completed/Met Date Met:  08/12/14     

## 2014-08-12 NOTE — Plan of Care (Signed)
Problem: Phase I Progression Outcomes Goal: Dangle or out of bed evening of surgery Outcome: Completed/Met Date Met:  08/12/14

## 2014-08-12 NOTE — Progress Notes (Signed)
Physical Therapy Treatment Patient Details Name: Adrienne Campbell MRN: [id number] DOB: [birth year] Today's Date: 08/12/2014    History of Present Illness R TKA, recent L THA Oct 2015 with posterior approach    PT Comments    *Pt progressing well with mobility. She reports nausea has resolved. Pt ambulated 130' with RW and performed R TKA exercises. She is able to do R SLR independently. Will plan to do stair training tomorrow morning, then it is expected she'll be ready to DC home from PT standpoint. **  Follow Up Recommendations  Home health PT     Equipment Recommendations  None recommended by PT    Recommendations for Other Services       Precautions / Restrictions Precautions Precautions: Posterior Hip Precaution Comments: Pt recalls 3/3 posterior hip precautions from L THA in October 2015 Restrictions Weight Bearing Restrictions: No Other Position/Activity Restrictions: WBAT RLE    Mobility  Bed Mobility Overal bed mobility: Modified Independent             General bed mobility comments: used rail, HOB up  Transfers Overall transfer level: Needs assistance Equipment used: Rolling walker (2 wheeled) Transfers: Sit to/from Stand Sit to Stand: Supervision         General transfer comment: cues for hand placement  Ambulation/Gait   Ambulation Distance (Feet): 130 Feet Assistive device: Rolling walker (2 wheeled) Gait Pattern/deviations: Step-to pattern;Antalgic   Gait velocity interpretation: Below normal speed for age/gender General Gait Details: good sequencing, steady, distance limited by pain/fatigue   Stairs            Wheelchair Mobility    Modified Rankin (Stroke Patients Only)       Balance Overall balance assessment: Modified Independent                                  Cognition Arousal/Alertness: Awake/alert Behavior During Therapy: WFL for tasks assessed/performed Overall Cognitive Status: Within  Functional Limits for tasks assessed                      Exercises Total Joint Exercises Ankle Circles/Pumps: AROM;Both;10 reps;Supine Quad Sets: AROM;Both;10 reps;Supine Short Arc Quad: AROM;Right;10 reps;Supine Heel Slides: AAROM;Right;10 reps;Supine Hip ABduction/ADduction: AROM;Right;10 reps;Supine Straight Leg Raises: AROM;Right;10 reps;Supine    General Comments        Pertinent Vitals/Pain Pain Score: 5  Pain Location: R knee Pain Descriptors / Indicators: Sore Pain Intervention(s): Limited activity within patient's tolerance;Monitored during session;Premedicated before session;Ice applied    Home Living                      Prior Function            PT Goals (current goals can now be found in the care plan section) Acute Rehab PT Goals Patient Stated Goal: wants to go to her rented apartment [facility] as soon as possible after DC PT Goal Formulation: With patient Time For Goal Achievement: 08/15/14 Potential to Achieve Goals: Good Progress towards PT goals: Progressing toward goals    Frequency  7X/week    PT Plan Current plan remains appropriate    Co-evaluation             End of Session Equipment Utilized During Treatment: Gait belt Activity Tolerance: Patient tolerated treatment well Patient left: in chair;with call bell/phone within reach     Time: 1400-1435 PT Time Calculation (min) (  ACUTE ONLY): 35 min  Charges:  $Gait Training: 8-22 mins $Therapeutic Exercise: 8-22 mins                    G Codes:      Philomena Doheny 08/12/2014, 2:45 PM [phone]

## 2014-08-12 NOTE — Progress Notes (Signed)
CARE MANAGEMENT NOTE 08/12/2014  Patient:  Adrienne Campbell, Adrienne Campbell   Account Number:  [id number]  Date Initiated:  08/12/2014  Documentation initiated by:  [facility]  Subjective/Objective Assessment:   s/p rt TKA     Action/Plan:   PT/OT evals- recommended HHPT   Anticipated DC Date:  08/13/2014   Anticipated DC Plan:  [place]  CM consult      [facility] Choice  [place]   Choice offered to / List presented to:  C-1 Patient   DME arranged  CPM      DME agency  [employer]     [place] arranged  HH-2 PT      [place]   Status of service:  Completed, signed off Medicare Important Message given?   (If response is "NO", the following Medicare IM given date fields will be blank) Date Medicare IM given:   Medicare IM given by:   Date Additional Medicare IM given:   Additional Medicare IM given by:    Discharge Disposition:  [place]  Per UR Regulation:    If discussed at Long Length of Stay Meetings, dates discussed:    Comments:  08/12/14 Spoke with patient about [facility], she stated that MD's office set her up with [facility] due to the fact that she will be going to [facility] a few days post d/c until end of Dec. [facility] has an office in [place]. Contact person at [facility] is [facility]. Contacted Audrey at [facility], they have her set up for HHPT, faxed HHPT order, H and P, Op note and PT eval to [phone]. Received confirmation. Patient has a rolling walker and 3N1 at home. [facility] and [place] will be providing a Engineer, manufacturing systems, BSN, CCM

## 2014-08-12 NOTE — Progress Notes (Signed)
Utilization review completed.  

## 2014-08-12 NOTE — Progress Notes (Signed)
Patient ID: Adrienne Campbell, female   DOB: 02/18/1961, 53 y.o.   MRN: [id number] PATIENT ID: Adrienne Campbell  MRN: [id number]  DOB/AGE:  [birth year] / 53 y.o.  1 Day Post-Op Procedure(s) (LRB): RIGHT TOTAL KNEE ARTHROPLASTY (Right)    PROGRESS NOTE Subjective: Patient is alert, oriented, x1 Nausea, x1 Vomiting, yes passing gas, no Bowel Movement. Taking PO well. Denies SOB, Chest or Calf Pain. Using Incentive Spirometer, PAS in place. Ambulate WBAT today, CPM 0-40 Patient reports pain as 3 on 0-10 scale  .    Objective: Vital signs in last 24 hours: Filed Vitals:   08/12/14 0000 08/12/14 0008 08/12/14 0400 08/12/14 0553  BP:  120/74  129/67  Pulse:  87  85  Temp:  97.6 F (36.4 C)  98.6 F (37 C)  TempSrc:      Resp: 16 16 16 16   Weight:      SpO2: 99% 96% 98% 98%      Intake/Output from previous day: I/O last 3 completed shifts: In: 1300 [P.O.:50; I.V.:1250] Out: 250 [Drains:250]   Intake/Output this shift: Total I/O In: -  Out: 1250 [Urine:1100; Drains:150]   LABORATORY DATA:  Recent Labs  08/12/14 0505  WBC 10.3  HGB 10.7*  HCT 32.7*  PLT 331    Examination: Neurologically intact ABD soft Neurovascular intact Sensation intact distally Intact pulses distally Dorsiflexion/Plantar flexion intact Incision: dressing C/D/I No cellulitis present Compartment soft} Blood and plasma separated in drain indicating minimal recent drainage, drain pulled without difficulty.  Assessment:   1 Day Post-Op Procedure(s) (LRB): RIGHT TOTAL KNEE ARTHROPLASTY (Right) ADDITIONAL DIAGNOSIS: Expected Acute Blood Loss Anemia, insomnia  Plan: PT/OT WBAT, CPM 5/hrs day until ROM 0-90 degrees, then D/C CPM DVT Prophylaxis:  SCDx72hrs, ASA 325 mg BID x 2 weeks DISCHARGE PLAN: Home, when passes PT DISCHARGE NEEDS: HHPT, CPM, Walker and 3-in-1 comode seat     Ingri Diemer J 08/12/2014, 6:42 AM

## 2014-08-12 NOTE — Evaluation (Signed)
Physical Therapy Evaluation Patient Details Name: Adrienne Campbell MRN: [id number] DOB: [birth year] Today's Date: 08/12/2014   History of Present Illness  R TKA, recent L THA Oct 2015 with posterior approach  Clinical Impression  **Pt is s/p TKA resulting in the deficits listed below (see PT Problem List). * Pt will benefit from skilled PT to increase their independence and safety with mobility to allow discharge to the venue listed below.   Pt is mobilizing well. She walked 41' with RW with min/guard assist, distance limited by nausea. Good progress expected once nausea resolves. Pt was instructed in TKA exercises and demonstrates good understanding of them.  *    Follow Up Recommendations Home health PT    Equipment Recommendations  None recommended by PT    Recommendations for Other Services       Precautions / Restrictions Precautions Precautions: Posterior Hip Precaution Comments: Pt recalls 3/3 posterior hip precautions from L THA in October 2015 Restrictions Weight Bearing Restrictions: No Other Position/Activity Restrictions: WBAT RLE      Mobility  Bed Mobility Overal bed mobility: Modified Independent             General bed mobility comments: used rail, HOB up  Transfers Overall transfer level: Needs assistance Equipment used: Rolling walker (2 wheeled) Transfers: Sit to/from Stand Sit to Stand: Min guard         General transfer comment: cues for hand placement  Ambulation/Gait Ambulation/Gait assistance: Min guard Ambulation Distance (Feet): 34 Feet Assistive device: Rolling walker (2 wheeled) Gait Pattern/deviations: Step-to pattern;Decreased stance time - right;Antalgic   Gait velocity interpretation: Below normal speed for age/gender General Gait Details: good sequencing, steady, distance limited by mild nausea  Stairs            Wheelchair Mobility    Modified Rankin (Stroke Patients Only)       Balance                                             Pertinent Vitals/Pain Pain Assessment: 0-10 Pain Score: 5  Pain Location: R knee Pain Descriptors / Indicators: Sore Pain Intervention(s): Limited activity within patient's tolerance;Monitored during session;Premedicated before session;Ice applied    Home Living Family/patient expects to be discharged to:: Private residence Living Arrangements: Spouse/significant other Available Help at Discharge: Family;Available 24 hours/day Type of Home: House Home Access: Stairs to enter Entrance Stairs-Rails: None Entrance Stairs-Number of Steps: 3 Home Layout: Two level Home Equipment: Walker - 2 wheels;Cane - single point;Bedside commode;Tub bench Additional Comments: handicapped height commode, has hip kit    Prior Function Level of Independence: Independent with assistive device(s)         Comments: with cane     Hand Dominance        Extremity/Trunk Assessment   Upper Extremity Assessment: Overall WFL for tasks assessed           Lower Extremity Assessment: RLE deficits/detail RLE Deficits / Details: knee flexion AAROM 40*       Communication   Communication: No difficulties  Cognition Arousal/Alertness: Awake/alert Behavior During Therapy: WFL for tasks assessed/performed Overall Cognitive Status: Within Functional Limits for tasks assessed                      General Comments      Exercises Total Joint Exercises Ankle Circles/Pumps: AROM;Both;10 reps;Supine Quad  Sets: AROM;Both;10 reps;Supine Heel Slides: AAROM;Right;10 reps;Supine Goniometric ROM: R knee flexion AAROM 40*      Assessment/Plan    PT Assessment Patient needs continued PT services  PT Diagnosis Acute pain;Difficulty walking   PT Problem List Decreased mobility;Decreased strength;Decreased activity tolerance;Pain  PT Treatment Interventions Gait training;Stair training;Functional mobility training;Therapeutic  activities;Patient/family education;Therapeutic exercise   PT Goals (Current goals can be found in the Care Plan section) Acute Rehab PT Goals Patient Stated Goal: wants to go to her rented apartment [facility] as soon as possible after DC PT Goal Formulation: With patient Time For Goal Achievement: 08/15/14 Potential to Achieve Goals: Good    Frequency 7X/week   Barriers to discharge        Co-evaluation               End of Session Equipment Utilized During Treatment: Gait belt Activity Tolerance: Treatment limited secondary to medical complications (Comment) (nausea) Patient left: in chair;with call bell/phone within reach Nurse Communication: Mobility status         Time: 0962-8366 PT Time Calculation (min) (ACUTE ONLY): 45 min   Charges:   PT Evaluation $Initial PT Evaluation Tier I: 1 Procedure PT Treatments $Gait Training: 8-22 mins $Therapeutic Exercise: 8-22 mins $Therapeutic Activity: 8-22 mins   PT G Codes:          Philomena Doheny 08/12/2014, 10:26 AM [phone]

## 2014-08-13 ENCOUNTER — Encounter (HOSPITAL_COMMUNITY): Payer: Self-pay | Admitting: Orthopedic Surgery

## 2014-08-13 LAB — CBC
HEMATOCRIT: 31.4 % — AB (ref 36.0–46.0)
Hemoglobin: 10.4 g/dL — ABNORMAL LOW (ref 12.0–15.0)
MCH: 33 pg (ref 26.0–34.0)
MCHC: 33.1 g/dL (ref 30.0–36.0)
MCV: 99.7 fL (ref 78.0–100.0)
Platelets: 333 10*3/uL (ref 150–400)
RBC: 3.15 MIL/uL — ABNORMAL LOW (ref 3.87–5.11)
RDW: 14.2 % (ref 11.5–15.5)
WBC: 11.8 10*3/uL — AB (ref 4.0–10.5)

## 2014-08-13 MED ORDER — METHOCARBAMOL 500 MG PO TABS: 500.0000 mg | ORAL_TABLET | Freq: Two times a day (BID) | ORAL | Status: DC

## 2014-08-13 MED ORDER — RIVAROXABAN 10 MG PO TABS: 10.0000 mg | ORAL_TABLET | Freq: Every day | ORAL | Status: DC

## 2014-08-13 MED ORDER — HYDROMORPHONE HCL 2 MG PO TABS: 2.0000 mg | ORAL_TABLET | ORAL | Status: DC | PRN

## 2014-08-13 MED ORDER — ONDANSETRON HCL 4 MG PO TABS: 4.0000 mg | ORAL_TABLET | Freq: Three times a day (TID) | ORAL | Status: DC | PRN

## 2014-08-13 NOTE — Plan of Care (Signed)
Problem: Acute Rehab PT Goals(only PT should resolve) Goal: Patient Will Transfer Sit To/From Stand Outcome: Completed/Met Date Met:  08/13/14 Goal: Pt Will Ambulate Outcome: Completed/Met Date Met:  08/13/14 Goal: Pt Will Go Up/Down Stairs Outcome: Completed/Met Date Met:  08/13/14 Goal: Pt/caregiver will Perform Home Exercise Program Outcome: Completed/Met Date Met:  08/13/14

## 2014-08-13 NOTE — Progress Notes (Signed)
PATIENT ID: Adrienne Campbell  MRN: [id number]  DOB/AGE:  [birth year] / 53 y.o.  2 Days Post-Op Procedure(s) (LRB): RIGHT TOTAL KNEE ARTHROPLASTY (Right)    PROGRESS NOTE Subjective: Patient is alert, oriented, yes Nausea, no Vomiting, yes passing gas, no Bowel Movement. Taking PO well. Denies SOB, Chest or Calf Pain. Using Incentive Spirometer, PAS in place. Ambulate WBAT, CPM 0-40 Patient reports pain as moderate  .    Objective: Vital signs in last 24 hours: Filed Vitals:   08/12/14 2029 08/13/14 0000 08/13/14 0400 08/13/14 0509  BP: 126/76   123/62  Pulse: 90   88  Temp: 99 F (37.2 C)   98.1 F (36.7 C)  TempSrc: Oral   Oral  Resp: 17 17 16 16   Weight:      SpO2: 97%   95%      Intake/Output from previous day: I/O last 3 completed shifts: In: 720 [P.O.:720] Out: 1250 [Urine:1100; Drains:150]   Intake/Output this shift:     LABORATORY DATA:  Recent Labs  08/12/14 0505 08/13/14 0500  WBC 10.3 11.8*  HGB 10.7* 10.4*  HCT 32.7* 31.4*  PLT 331 333  NA 128*  --   K 4.6  --   CL 93*  --   CO2 24  --   BUN 8  --   CREATININE 0.59  --   GLUCOSE 134*  --   CALCIUM 8.7  --     Examination: Neurologically intact Neurovascular intact Sensation intact distally Intact pulses distally Dorsiflexion/Plantar flexion intact Incision: dressing C/D/I No cellulitis present Compartment soft}  Assessment:   2 Days Post-Op Procedure(s) (LRB): RIGHT TOTAL KNEE ARTHROPLASTY (Right) ADDITIONAL DIAGNOSIS: Expected Acute Blood Loss Anemia,  Plan: PT/OT WBAT, CPM 5/hrs day until ROM 0-90 degrees, then D/C CPM DVT Prophylaxis:  SCDx72hrs, Xeralto 10 mg x 30 days DISCHARGE PLAN: Home DISCHARGE NEEDS: HHPT, HHRN, CPM, Walker and 3-in-1 comode seat     PHILLIPS, ERIC R 08/13/2014, 7:22 AM

## 2014-08-13 NOTE — Progress Notes (Signed)
Physical Therapy Treatment Patient Details Name: Adrienne Campbell MRN: [id number] DOB: [birth year] Today's Date: 08/13/2014    History of Present Illness R TKA, recent L THA Oct 2015 with posterior approach    PT Comments    Pt progressing very well with all aspects of mobility.  Very good recall of how to perform stairs with RW and states that husband is also very good at assisting her at home.  Goals met and all education completed, pt for D/C today.  RN made aware.   Follow Up Recommendations  Home health PT     Equipment Recommendations  None recommended by PT    Recommendations for Other Services       Precautions / Restrictions Precautions Precautions: Posterior Hip Precaution Comments: Pt recalls 3/3 posterior hip precautions from L THA in October 2015 Restrictions Weight Bearing Restrictions: Yes RLE Weight Bearing: Weight bearing as tolerated Other Position/Activity Restrictions: WBAT RLE    Mobility  Bed Mobility Overal bed mobility: Modified Independent             General bed mobility comments: No use of rail today, HOB slightly elevated  Transfers Overall transfer level: Needs assistance Equipment used: Rolling walker (2 wheeled) Transfers: Sit to/from Stand Sit to Stand: Supervision         General transfer comment: cues for hand placement  Ambulation/Gait Ambulation/Gait assistance: Supervision Ambulation Distance (Feet): 130 Feet Assistive device: Rolling walker (2 wheeled) Gait Pattern/deviations: Step-to pattern;Step-through pattern;Antalgic     General Gait Details: Pt able to tolerate increased WB through RLE today vs previous days per her report.  Good sequencing with RW.    Stairs Stairs: Yes Stairs assistance: Min guard Stair Management: No rails;Step to pattern;Backwards;With walker Number of Stairs: 2 (has 5 at home) General stair comments: Pt with good recall on how to perform stairs with RW.  Min cues for first step  keeping RW on ground then bringing up.    Wheelchair Mobility    Modified Rankin (Stroke Patients Only)       Balance Overall balance assessment: Modified Independent                                  Cognition Arousal/Alertness: Awake/alert Behavior During Therapy: WFL for tasks assessed/performed Overall Cognitive Status: Within Functional Limits for tasks assessed                      Exercises Other Exercises Other Exercises: Seated LAQ's x 10 reps RLE with 4-5 second hold.  Pt comfortable with all other exercises.     General Comments        Pertinent Vitals/Pain Pain Assessment: 0-10 Pain Score: 4  Pain Location: R knee Pain Descriptors / Indicators: Aching;Sore Pain Intervention(s): RN gave pain meds during session;Repositioned;Ice applied    Home Living                      Prior Function            PT Goals (current goals can now be found in the care plan section) Acute Rehab PT Goals Patient Stated Goal: wants to go to her rented apartment [facility] as soon as possible after DC PT Goal Formulation: With patient Time For Goal Achievement: 08/15/14 Potential to Achieve Goals: Good Progress towards PT goals: Goals met/education completed, patient discharged from PT    Frequency  7X/week  PT Plan Current plan remains appropriate    Co-evaluation             End of Session   Activity Tolerance: Patient tolerated treatment well Patient left: in chair;with call bell/phone within reach     Time: 0852-0917 PT Time Calculation (min) (ACUTE ONLY): 25 min  Charges:  $Gait Training: 8-22 mins $Therapeutic Activity: 8-22 mins                    G Codes:      Denice Bors 08/13/2014, 9:42 AM

## 2014-08-13 NOTE — Discharge Summary (Signed)
Patient ID: Adrienne Campbell MRN: [id number] DOB/AGE: [birth year] 53 y.o.  Admit date: 08/11/2014 Discharge date: 08/13/2014  Admission Diagnoses:  Principal Problem:   Primary osteoarthritis of right knee Active Problems:   Arthritis of knee   Discharge Diagnoses:  Same  Past Medical History  Diagnosis Date  . Hyperlipidemia   . Arthritis   . PONV (postoperative nausea and vomiting)     Nausea  . Pneumonia     hx of  . Depression   . Environmental and seasonal allergies   . Insomnia     Surgeries: Procedure(s): RIGHT TOTAL KNEE ARTHROPLASTY on 08/11/2014   Consultants:    Discharged Condition: Improved  Hospital Course: Adrienne Campbell is an 54 y.o. female who was admitted 08/11/2014 for operative treatment ofPrimary osteoarthritis of right knee. Patient has severe unremitting pain that affects sleep, daily activities, and work/hobbies. After pre-op clearance the patient was taken to the operating room on 08/11/2014 and underwent  Procedure(s): RIGHT TOTAL KNEE ARTHROPLASTY.    Patient was given perioperative antibiotics: Anti-infectives    Start     Dose/Rate Route Frequency Ordered Stop   08/12/14 0800  ciprofloxacin (CIPRO) tablet 250 mg     250 mg Oral Daily with breakfast 08/11/14 2001     08/11/14 1547  cefUROXime (ZINACEF) injection  Status:  Discontinued       As needed 08/11/14 1548 08/11/14 1709   08/11/14 0600  ceFAZolin (ANCEF) IVPB 2 g/50 mL premix     2 g100 mL/hr over 30 Minutes Intravenous On call to O.R. 08/10/14 1412 08/11/14 1547       Patient was given sequential compression devices, early ambulation, and chemoprophylaxis to prevent DVT.  Patient benefited maximally from hospital stay and there were no complications.    Recent vital signs: Patient Vitals for the past 24 hrs:  BP Temp Temp src Pulse Resp SpO2  08/13/14 0509 123/62 mmHg 98.1 F (36.7 C) Oral 88 16 95 %  08/13/14 0400 - - - - 16 -  08/13/14 0000 - - - - 17 -   08/12/14 2029 126/76 mmHg 99 F (37.2 C) Oral 90 17 97 %  08/12/14 2000 - - - - 17 -  08/12/14 1600 - - - - 16 98 %  08/12/14 1349 131/69 mmHg 97.6 F (36.4 C) Oral 93 18 97 %  08/12/14 1200 - - - - 18 98 %  08/12/14 0800 - - - - 16 98 %     Recent laboratory studies:  Recent Labs  08/12/14 0505 08/13/14 0500  WBC 10.3 11.8*  HGB 10.7* 10.4*  HCT 32.7* 31.4*  PLT 331 333  NA 128*  --   K 4.6  --   CL 93*  --   CO2 24  --   BUN 8  --   CREATININE 0.59  --   GLUCOSE 134*  --   CALCIUM 8.7  --      Discharge Medications:     Medication List    STOP taking these medications        oxyCODONE-acetaminophen 5-325 MG per tablet  Commonly known as:  ROXICET      TAKE these medications        acetaminophen 650 MG CR tablet  Commonly known as:  TYLENOL  Take 1,300 mg by mouth 2 (two) times daily.     celecoxib 200 MG capsule  Commonly known as:  CELEBREX  Take 200 mg by mouth 2 (two) times daily.  cetirizine 10 MG tablet  Commonly known as:  ZYRTEC  Take 1 tablet (10 mg total) by mouth daily.     cholecalciferol 1000 UNITS tablet  Commonly known as:  VITAMIN D  Take 1,000 Units by mouth 2 (two) times daily.     CIPRO 250 MG tablet  Generic drug:  ciprofloxacin  Take 250 mg by mouth daily with breakfast.     conjugated estrogens vaginal cream  Commonly known as:  PREMARIN  Place 1 Applicatorful vaginally 3 (three) times a week.     EFFEXOR XR 150 MG 24 hr capsule  Generic drug:  venlafaxine XR  Take 1 capsule (150 mg total) by mouth daily.     folic acid 1 MG tablet  Commonly known as:  FOLVITE  Take 1 mg by mouth daily.     GINSENG COMPLEX PO  Take 1 capsule by mouth daily.     HUMIRA 40 MG/0.8ML Pskt  Generic drug:  Adalimumab  Inject 1 each into the skin every Friday.     HYDROmorphone 2 MG tablet  Commonly known as:  DILAUDID  Take 1 tablet (2 mg total) by mouth every 4 (four) hours as needed for severe pain.     hydrOXYzine 25 MG  capsule  Commonly known as:  VISTARIL  Take 1 capsule (25 mg total) by mouth at bedtime. Used for sleep     Melatonin 5 MG Caps  Take 5 mg by mouth at bedtime.     methocarbamol 500 MG tablet  Commonly known as:  ROBAXIN  Take 1 tablet (500 mg total) by mouth 2 (two) times daily with a meal.     methotrexate 2.5 MG tablet  Commonly known as:  RHEUMATREX  Take 15 mg by mouth every Friday.     MULTIVITAMIN & MINERAL PO  Take by mouth at bedtime.     ondansetron 4 MG tablet  Commonly known as:  ZOFRAN  Take 1 tablet (4 mg total) by mouth every 8 (eight) hours as needed for nausea or vomiting.     pravastatin 40 MG tablet  Commonly known as:  PRAVACHOL  Take 40 mg by mouth daily.     rivaroxaban 10 MG Tabs tablet  Commonly known as:  XARELTO  Take 1 tablet (10 mg total) by mouth daily.        Diagnostic Studies: No results found.  Disposition: 06-Home-Health Care Svc      Discharge Instructions    CPM    Complete by:  As directed   Continuous passive motion machine (CPM):      Use the CPM from 0 to 60  for 5 hours per day.      You may increase by 10 degrees per day.  You may break it up into 2 or 3 sessions per day.      Use CPM for 2 weeks or until you are told to stop.     Call MD / Call 911    Complete by:  As directed   If you experience chest pain or shortness of breath, CALL 911 and be transported to the hospital emergency room.  If you develope a fever above 101 F, pus (white drainage) or increased drainage or redness at the wound, or calf pain, call your surgeon's office.     Change dressing    Complete by:  As directed   Change dressing on 5, then change the dressing daily with sterile 4 x 4 inch gauze dressing  and apply TED hose.  You may clean the incision with alcohol prior to redressing.     Constipation Prevention    Complete by:  As directed   Drink plenty of fluids.  Prune juice may be helpful.  You may use a stool softener, such as Colace (over the  counter) 100 mg twice a day.  Use MiraLax (over the counter) for constipation as needed.     Diet - low sodium heart healthy    Complete by:  As directed      Discharge instructions    Complete by:  As directed   Follow up in office with Dr. Mayer Camel in 2 weeks.     Driving restrictions    Complete by:  As directed   No driving for 2 weeks     Increase activity slowly as tolerated    Complete by:  As directed      Patient may shower    Complete by:  As directed   You may shower without a dressing once there is no drainage.  Do not wash over the wound.  If drainage remains, cover wound with plastic wrap and then shower.           Follow-up Information    Follow up with [facility].   Why:  They will contact you to schedule home physical therapy visits.   Contact information:   [phone]      Follow up with Kerin Salen, MD In 2 weeks.   Specialty:  Orthopedic Surgery   Contact information:   [place] [address] [phone]        Signed: Hardin Negus Ashey Tramontana R 08/13/2014, 7:21 AM

## 2015-01-14 ENCOUNTER — Other Ambulatory Visit: Payer: Self-pay

## 2015-01-14 NOTE — Patient Outreach (Signed)
[place] [facility]) Care Management  01/14/2015  KATORA FINI [birth year] [id number]   Phone call to schedule appointment.  No answer and message left.  Plan to send appointment request letter.   Peter Garter RN, [facility] Care Management Coordinator-Link to [place] Management [phone]

## 2015-02-17 ENCOUNTER — Other Ambulatory Visit: Payer: Self-pay

## 2015-02-17 VITALS — BP 130/88 | HR 82 | Ht 65.0 in | Wt 155.8 lb

## 2015-02-17 DIAGNOSIS — E7800 Pure hypercholesterolemia, unspecified: Secondary | ICD-10-CM

## 2015-02-17 NOTE — Patient Outreach (Signed)
[place] [facility]) Care Management   02/17/2015  Adrienne Campbell 1961-06-15 [id number]  Adrienne Campbell is an 54 y.o. female.   Member seen for follow up office visit for Link to Wellness program for self management of hyperlipidemia.  Subjective: Member states that she has stopped eating any meat but she does eat fish.  States that she has not gotten back into a regular exercise program since her hip and knee replacement.  States she does not want to go back to swimming due to the chlorine in the water and she is fearful she will injury her joints if she uses exercise equipment.   States she is to go back to her MD in October for her physical and to have her labs rechecked.  States she does eat lots of vegetables and fiber in her diet  Objective:   ROS  Physical Exam  Filed Vitals:   02/17/15 1535  BP: 130/88  Pulse: 82   Filed Weights   02/17/15 1535  Weight: 155 lb 12.8 oz (70.67 kg)    Current Medications:   Current Outpatient Prescriptions  Medication Sig Dispense Refill  . acetaminophen (TYLENOL) 650 MG CR tablet Take 1,300 mg by mouth 2 (two) times daily.    . Adalimumab (HUMIRA) 40 MG/0.8ML PSKT Inject 1 each into the skin.     . celecoxib (CELEBREX) 200 MG capsule Take 200 mg by mouth 2 (two) times daily.    . cetirizine (ZYRTEC) 10 MG tablet Take 1 tablet (10 mg total) by mouth daily.    . cholecalciferol (VITAMIN D) 1000 UNITS tablet Take 1,000 Units by mouth 2 (two) times daily.    Marland Kitchen conjugated estrogens (PREMARIN) vaginal cream Place 1 Applicatorful vaginally 3 (three) times a week.    . hydrOXYzine (VISTARIL) 25 MG capsule Take 1 capsule (25 mg total) by mouth at bedtime. Used for sleep    . pravastatin (PRAVACHOL) 40 MG tablet Take 40 mg by mouth daily.    Marland Kitchen venlafaxine XR (EFFEXOR XR) 150 MG 24 hr capsule Take 1 capsule (150 mg total) by mouth daily.    . ciprofloxacin (CIPRO) 250 MG tablet Take 250 mg by mouth daily with breakfast.    .  folic acid (FOLVITE) 1 MG tablet Take 1 mg by mouth daily.    Marland Kitchen HYDROmorphone (DILAUDID) 2 MG tablet Take 1 tablet (2 mg total) by mouth every 4 (four) hours as needed for severe pain. (Patient not taking: Reported on 02/17/2015) 60 tablet 0  . Melatonin 5 MG CAPS Take 5 mg by mouth at bedtime.    . methocarbamol (ROBAXIN) 500 MG tablet Take 1 tablet (500 mg total) by mouth 2 (two) times daily with a meal. (Patient not taking: Reported on 02/17/2015) 60 tablet 0  . methotrexate (RHEUMATREX) 2.5 MG tablet Take 15 mg by mouth every Friday.  3  . Misc Natural Products (GINSENG COMPLEX PO) Take 1 capsule by mouth daily.     . Multiple Vitamins-Minerals (MULTIVITAMIN & MINERAL PO) Take by mouth at bedtime.    . ondansetron (ZOFRAN) 4 MG tablet Take 1 tablet (4 mg total) by mouth every 8 (eight) hours as needed for nausea or vomiting. (Patient not taking: Reported on 02/17/2015) 30 tablet 0  . rivaroxaban (XARELTO) 10 MG TABS tablet Take 1 tablet (10 mg total) by mouth daily. (Patient not taking: Reported on 02/17/2015) 30 tablet 0   No current facility-administered medications for this visit.    Functional Status:  In your present state of health, do you have any difficulty performing the following activities: 02/17/2015 08/11/2014  Hearing? N N  Vision? N N  Difficulty concentrating or making decisions? N N  Walking or climbing stairs? N Y  Dressing or bathing? N N  Doing errands, shopping? N N  Preparing Food and eating ? N -  Using the Toilet? N -  In the past six months, have you accidently leaked urine? N -  Do you have problems with loss of bowel control? N -  Managing your Medications? N -  Managing your Finances? N -  Housekeeping or managing your Housekeeping? N -    Fall/Depression Screening:    PHQ 2/9 Scores 02/17/2015  PHQ - 2 Score 1   THN CM Care Plan Problem One        Patient Outreach from 02/17/2015 in [place] Problem One  Elevated lipids   Care  Plan for Problem One  Active   THN Long Term Goal (31-90 days)  Member will decrease lipid levels at next check by MD in October   THN Long Term Goal Start Date  02/18/15   Interventions for Problem One Long Term Goal  Reviewed following diet low in saturated fats, instructed to eat more fiber, fresh fruits and vegetables, Discussed different exercises she might try  that will not have impact on her joints, Reinforced to take medication daily as ordered   THN CM Short Term Goal #1 (0-30 days)  Member will complete EMMI program by 03/20/15   [facility] CM Short Term Goal #1 Start Date  02/18/15   Interventions for Short Term Goal #1  Instructed on how to access EMMI programs      Assessment:   Member seen for follow up office visit for Link to Wellness program for self management of hyperlipidemia.  Member following low cholesterol diet and taking medications as ordered.  Member has not been exercising regularly since hip and knee replacement surgeries.  Member hesitant about setting exercise goals but does think she would do some dancing at home.  Plan to see member after her MD appt in October to review her numbers and will close case if stable.  Plan:  Plan to complete EMMI program by 03/20/15 Plan to dance for 15 minutes 3 times a week Return to Link to Wellness July 07, 2015 at 4 PM at [place] office Peter Garter RN, [facility] Care Management Coordinator-Link to [place] Management [phone]

## 2015-02-18 NOTE — Patient Instructions (Signed)
1. Plan to complete EMMI program by 03/20/15 2. Plan to dance for 15 minutes 3 times a week 3. Return to Link to Wellness July 07, 2015 at 4 PM at [place] office

## 2015-07-07 ENCOUNTER — Other Ambulatory Visit: Payer: 59

## 2015-08-08 ENCOUNTER — Other Ambulatory Visit: Payer: Self-pay

## 2015-08-08 NOTE — Patient Outreach (Signed)
[place] [facility]) Care Management  08/08/2015  CHRISTINE PANZARELLA 1960/12/04 [id number] Member has not responded to appointment request letter or message left.  Case closed as member has withdrawn from the program. Peter Garter RN, [facility] Care Management Coordinator-Link to [place] Management [phone]

## 2015-09-08 MED FILL — HUMIRA 40 MG/0.8ML PSKT: 40 | 14 days supply | Qty: 2 | Fill #4

## 2015-09-08 MED FILL — HYDROXYZINE PAM 25 MG CAP: 25 | 90 days supply | Qty: 90 | Fill #1

## 2015-09-08 MED FILL — CYCLOBENZAPRINE 10 MG TAB: 10 | 30 days supply | Qty: 30 | Fill #3

## 2015-09-13 DIAGNOSIS — L570 Actinic keratosis: Secondary | ICD-10-CM | POA: Diagnosis not present

## 2015-09-19 MED FILL — CELECOXIB 200 MG CAPSULE: 200 | 30 days supply | Qty: 60 | Fill #0

## 2015-09-19 MED FILL — HUMIRA 40 MG/0.8ML PSKT: 40 | 28 days supply | Qty: 4 | Fill #0

## 2015-09-20 MED FILL — YUVAFEM 10 MCG VAGINAL INSE: 10 | 28 days supply | Qty: 12 | Fill #3

## 2015-10-05 MED FILL — VENLAFAXINE HCL ER 150 MG C: 150 | 90 days supply | Qty: 90 | Fill #1

## 2015-10-18 DIAGNOSIS — M62838 Other muscle spasm: Secondary | ICD-10-CM | POA: Diagnosis not present

## 2015-10-18 DIAGNOSIS — Z79899 Other long term (current) drug therapy: Secondary | ICD-10-CM | POA: Diagnosis not present

## 2015-10-18 DIAGNOSIS — M150 Primary generalized (osteo)arthritis: Secondary | ICD-10-CM | POA: Diagnosis not present

## 2015-10-18 DIAGNOSIS — M542 Cervicalgia: Secondary | ICD-10-CM | POA: Diagnosis not present

## 2015-10-18 DIAGNOSIS — M47899 Other spondylosis, site unspecified: Secondary | ICD-10-CM | POA: Diagnosis not present

## 2015-10-18 DIAGNOSIS — M2550 Pain in unspecified joint: Secondary | ICD-10-CM | POA: Diagnosis not present

## 2015-10-18 MED FILL — CYCLOBENZAPRINE 10 MG TAB: 10 | 30 days supply | Qty: 90 | Fill #0

## 2015-10-19 MED FILL — CELECOXIB 200 MG CAPSULE: 200 | 30 days supply | Qty: 60 | Fill #0

## 2015-11-03 MED FILL — HUMIRA 40 MG/0.8ML PSKT: 40 | 28 days supply | Qty: 4 | Fill #1

## 2015-11-03 MED FILL — YUVAFEM 10 MCG VAGINAL INSE: 10 | 28 days supply | Qty: 12 | Fill #4

## 2015-11-16 DIAGNOSIS — H524 Presbyopia: Secondary | ICD-10-CM | POA: Diagnosis not present

## 2015-11-22 MED FILL — PRAVASTATIN NA 40 MG TAB: 40 | 90 days supply | Qty: 90 | Fill #1

## 2015-11-22 MED FILL — HYDROXYZINE PAM 25 MG CAP: 25 | 90 days supply | Qty: 90 | Fill #0

## 2015-11-22 MED FILL — CELECOXIB 200 MG CAPSULE: 200 | 30 days supply | Qty: 60 | Fill #0

## 2015-11-24 MED FILL — HUMIRA 40 MG/0.8ML PSKT: 40 | 28 days supply | Qty: 4 | Fill #2

## 2015-12-02 MED FILL — YUVAFEM 10 MCG VAGINAL INSE: 10 | 28 days supply | Qty: 12 | Fill #5

## 2015-12-15 MED FILL — CELECOXIB 200 MG CAPSULE: 200 | 30 days supply | Qty: 60 | Fill #0

## 2015-12-29 MED FILL — VENLAFAXINE HCL ER 150 MG C: 150 | 90 days supply | Qty: 90 | Fill #2

## 2015-12-29 MED FILL — CYCLOBENZAPRINE 10 MG TAB: 10 | 30 days supply | Qty: 90 | Fill #1

## 2016-01-06 MED FILL — YUVAFEM 10 MCG VAGINAL INSE: 10 | 28 days supply | Qty: 12 | Fill #6

## 2016-01-09 MED FILL — HUMIRA 40 MG/0.8ML PSKT: 40 | 28 days supply | Qty: 4 | Fill #0

## 2016-01-20 MED FILL — CELECOXIB 200 MG CAPSULE: 200 | 30 days supply | Qty: 60 | Fill #0

## 2016-02-15 MED FILL — YUVAFEM 10 MCG VAGINAL INSE: 10 | 28 days supply | Qty: 12 | Fill #7

## 2016-02-15 MED FILL — HYDROXYZINE PAM 25 MG CAP: 25 | 90 days supply | Qty: 90 | Fill #1

## 2016-02-15 MED FILL — PRAVASTATIN NA 40 MG TAB: 40 | 90 days supply | Qty: 90 | Fill #2

## 2016-02-16 MED FILL — HUMIRA 40 MG/0.8ML PSKT: 40 | 28 days supply | Qty: 4 | Fill #1

## 2016-02-16 MED FILL — CELECOXIB 200 MG CAPSULE: 200 | 30 days supply | Qty: 60 | Fill #0

## 2016-03-13 ENCOUNTER — Ambulatory Visit (HOSPITAL_BASED_OUTPATIENT_CLINIC_OR_DEPARTMENT_OTHER): Payer: 59 | Admitting: Pharmacist

## 2016-03-13 DIAGNOSIS — M459 Ankylosing spondylitis of unspecified sites in spine: Secondary | ICD-10-CM

## 2016-03-13 MED ORDER — ADALIMUMAB 40 MG/0.8ML ~~LOC~~ PSKT: 1.0000 | PREFILLED_SYRINGE | SUBCUTANEOUS | Status: DC

## 2016-03-13 MED FILL — CYCLOBENZAPRINE 10 MG TAB: 10 | 30 days supply | Qty: 90 | Fill #2

## 2016-03-13 NOTE — Progress Notes (Signed)
   S: Patient presents today to the [facility].  Patient is currently taking Humira for ankylosing spondylitis. Patient is managed by Dr. Trudie Reed for this. She believes that the Humira is working better than medications she has tried in the past (Enbrel, methotrexate) but that she still has some back pain.   Adherence: denies any missed doses.  Dosing:  Ankylosing spondylitis: SubQ: 40 mg every other week (may continue methotrexate, other nonbiologic DMARDS, corticosteroids, NSAIDs and/or analgesics). Patient currently on weekly.  Drug-drug interactions: patient would like to take ginseng and gingko biloba for energy - counseled patient on the possibility of an increased risk of bleeding with ginseng, gingko biloba, venlafaxine, and celecoxib.  Screening: TB test: completed per patient Hepatitis: completed per patient  Monitoring: S/sx of infection: denies CBC: WNL per patient, checked q3 months by Dr. Trudie Reed S/sx of hypersensitivity: denies S/sx of malignancy: denies S/sx of heart failure: denies    O:     Lab Results  Component Value Date   WBC 11.8* 08/13/2014   HGB 10.4* 08/13/2014   HCT 31.4* 08/13/2014   MCV 99.7 08/13/2014   PLT 333 08/13/2014      Chemistry      Component Value Date/Time   NA 128* 08/12/2014 0505   K 4.6 08/12/2014 0505   CL 93* 08/12/2014 0505   CO2 24 08/12/2014 0505   BUN 8 08/12/2014 0505   CREATININE 0.59 08/12/2014 0505      Component Value Date/Time   CALCIUM 8.7 08/12/2014 0505       A/P: 1. Medication review: Patient currently on Humira and tolerating it well with no adverse effects and improved disease control. Reviewed drug-drug interactions with her. Reviewed the medication with her, including the following: Humira is a TNF blocking agent indicated for ankylosing spondylitis, Crohn's disease, Hidradenitis suppurativa, psoriatic arthritis, plaque psoriasis, ulcerative colitis,  and uveitis. The most common adverse effects are infections, headache, and injection site reactions. There is the possibility of an increased risk of malignancy but it is not well understood if this increased risk is due to there medication or the disease state. There are rare cases of pancytopenia and aplastic anemia. Will let Dr. Trudie Reed know that Humira 40 mg every 2 weeks if the FDA-approved dose of Humira in ankylosing spondylitis and see if it would be feasible to decrease the dose to every 2 weeks.     Nicoletta Ba, PharmD, BCPS, [facility] [phone]

## 2016-03-13 NOTE — Addendum Note (Signed)
Addended by: Nicoletta Ba A on: 03/13/2016 12:15 PM   Modules accepted: Level of Service

## 2016-03-14 MED FILL — HUMIRA 40 MG/0.8ML PSKT: 40 | 28 days supply | Qty: 4 | Fill #0

## 2016-03-22 MED FILL — YUVAFEM 10 MCG VAGINAL INSE: 10 | 28 days supply | Qty: 12 | Fill #8

## 2016-03-23 MED FILL — CELECOXIB 200 MG CAPSULE: 200 | 30 days supply | Qty: 60 | Fill #0

## 2016-03-29 MED FILL — VENLAFAXINE HCL ER 150 MG C: 150 | 90 days supply | Qty: 90 | Fill #3

## 2016-04-17 DIAGNOSIS — Z79899 Other long term (current) drug therapy: Secondary | ICD-10-CM | POA: Diagnosis not present

## 2016-04-17 DIAGNOSIS — M542 Cervicalgia: Secondary | ICD-10-CM | POA: Diagnosis not present

## 2016-04-17 DIAGNOSIS — M150 Primary generalized (osteo)arthritis: Secondary | ICD-10-CM | POA: Diagnosis not present

## 2016-04-17 DIAGNOSIS — M47899 Other spondylosis, site unspecified: Secondary | ICD-10-CM | POA: Diagnosis not present

## 2016-04-17 DIAGNOSIS — M2550 Pain in unspecified joint: Secondary | ICD-10-CM | POA: Diagnosis not present

## 2016-04-17 DIAGNOSIS — M62838 Other muscle spasm: Secondary | ICD-10-CM | POA: Diagnosis not present

## 2016-04-17 MED FILL — CELECOXIB 200 MG CAPSULE: 200 | 90 days supply | Qty: 180 | Fill #0

## 2016-05-01 ENCOUNTER — Ambulatory Visit: Payer: 59 | Admitting: Dietician

## 2016-05-03 ENCOUNTER — Other Ambulatory Visit: Payer: Self-pay | Admitting: Pharmacist

## 2016-05-03 MED ORDER — ADALIMUMAB 40 MG/0.8ML ~~LOC~~ PSKT: 1.0000 | PREFILLED_SYRINGE | SUBCUTANEOUS | 2 refills | Status: DC

## 2016-05-03 MED FILL — HUMIRA 40 MG/0.8ML PSKT: 40 | 28 days supply | Qty: 4 | Fill #0

## 2016-05-03 MED FILL — YUVAFEM 10 MCG VAGINAL INSE: 10 | 28 days supply | Qty: 12 | Fill #0

## 2016-06-05 MED FILL — YUVAFEM 10 MCG VAGINAL INSE: 10 | 28 days supply | Qty: 12 | Fill #1

## 2016-06-05 MED FILL — PRAVASTATIN NA 40 MG TAB: 40 | 90 days supply | Qty: 90 | Fill #0

## 2016-06-05 MED FILL — CYCLOBENZAPRINE 10 MG TAB: 10 | 30 days supply | Qty: 90 | Fill #3

## 2016-06-06 MED FILL — HYDROXYZINE PAM 25 MG CAP: 25 | 90 days supply | Qty: 90 | Fill #0

## 2016-06-06 MED FILL — HUMIRA 40 MG/0.8ML PSKT: 40 | 28 days supply | Qty: 4 | Fill #1

## 2016-06-26 DIAGNOSIS — L821 Other seborrheic keratosis: Secondary | ICD-10-CM | POA: Diagnosis not present

## 2016-06-26 DIAGNOSIS — L738 Other specified follicular disorders: Secondary | ICD-10-CM | POA: Diagnosis not present

## 2016-06-26 DIAGNOSIS — D239 Other benign neoplasm of skin, unspecified: Secondary | ICD-10-CM | POA: Diagnosis not present

## 2016-06-27 DIAGNOSIS — E7800 Pure hypercholesterolemia, unspecified: Secondary | ICD-10-CM | POA: Diagnosis not present

## 2016-06-27 DIAGNOSIS — Z1211 Encounter for screening for malignant neoplasm of colon: Secondary | ICD-10-CM | POA: Diagnosis not present

## 2016-06-27 DIAGNOSIS — G4700 Insomnia, unspecified: Secondary | ICD-10-CM | POA: Diagnosis not present

## 2016-06-27 DIAGNOSIS — B009 Herpesviral infection, unspecified: Secondary | ICD-10-CM | POA: Diagnosis not present

## 2016-06-27 DIAGNOSIS — Z0000 Encounter for general adult medical examination without abnormal findings: Secondary | ICD-10-CM | POA: Diagnosis not present

## 2016-06-27 DIAGNOSIS — F321 Major depressive disorder, single episode, moderate: Secondary | ICD-10-CM | POA: Diagnosis not present

## 2016-06-28 MED FILL — ROSUVASTATIN CALCIUM 5 MG T: 5 | 90 days supply | Qty: 90 | Fill #0

## 2016-07-04 MED FILL — VENLAFAXINE HCL ER 150 MG C: 150 | 90 days supply | Qty: 90 | Fill #0

## 2016-07-12 DIAGNOSIS — R3912 Poor urinary stream: Secondary | ICD-10-CM | POA: Diagnosis not present

## 2016-07-12 DIAGNOSIS — N952 Postmenopausal atrophic vaginitis: Secondary | ICD-10-CM | POA: Diagnosis not present

## 2016-07-12 DIAGNOSIS — N3020 Other chronic cystitis without hematuria: Secondary | ICD-10-CM | POA: Diagnosis not present

## 2016-07-13 MED FILL — YUVAFEM 10 MCG VAGINAL INSE: 10 | 28 days supply | Qty: 12 | Fill #0

## 2016-07-18 DIAGNOSIS — M150 Primary generalized (osteo)arthritis: Secondary | ICD-10-CM | POA: Diagnosis not present

## 2016-07-18 DIAGNOSIS — M47899 Other spondylosis, site unspecified: Secondary | ICD-10-CM | POA: Diagnosis not present

## 2016-07-18 DIAGNOSIS — M542 Cervicalgia: Secondary | ICD-10-CM | POA: Diagnosis not present

## 2016-07-18 DIAGNOSIS — M2550 Pain in unspecified joint: Secondary | ICD-10-CM | POA: Diagnosis not present

## 2016-07-18 DIAGNOSIS — Z79899 Other long term (current) drug therapy: Secondary | ICD-10-CM | POA: Diagnosis not present

## 2016-07-18 DIAGNOSIS — M25551 Pain in right hip: Secondary | ICD-10-CM | POA: Diagnosis not present

## 2016-07-18 DIAGNOSIS — M62838 Other muscle spasm: Secondary | ICD-10-CM | POA: Diagnosis not present

## 2016-07-20 ENCOUNTER — Other Ambulatory Visit: Payer: Self-pay | Admitting: Family Medicine

## 2016-07-20 DIAGNOSIS — Z1231 Encounter for screening mammogram for malignant neoplasm of breast: Secondary | ICD-10-CM

## 2016-07-23 MED FILL — HUMIRA 40 MG/0.8ML PSKT: 40 | 28 days supply | Qty: 4 | Fill #2

## 2016-07-23 MED FILL — CELECOXIB 200 MG CAPSULE: 200 | 90 days supply | Qty: 180 | Fill #1

## 2016-08-09 MED FILL — CYCLOBENZAPRINE 10 MG TAB: 10 | 30 days supply | Qty: 90 | Fill #0

## 2016-08-10 ENCOUNTER — Ambulatory Visit
Admission: RE | Admit: 2016-08-10 | Discharge: 2016-08-10 | Disposition: A | Discharge status: Home / Self Care | Payer: 59 | Source: Ambulatory Visit | Attending: Family Medicine | Admitting: Family Medicine

## 2016-08-10 DIAGNOSIS — Z1231 Encounter for screening mammogram for malignant neoplasm of breast: Secondary | ICD-10-CM

## 2016-08-14 ENCOUNTER — Other Ambulatory Visit: Payer: Self-pay | Admitting: Family Medicine

## 2016-08-14 DIAGNOSIS — R928 Other abnormal and inconclusive findings on diagnostic imaging of breast: Secondary | ICD-10-CM

## 2016-08-16 ENCOUNTER — Ambulatory Visit
Admission: RE | Admit: 2016-08-16 | Discharge: 2016-08-16 | Disposition: A | Discharge status: Home / Self Care | Payer: 59 | Source: Ambulatory Visit | Attending: Family Medicine | Admitting: Family Medicine

## 2016-08-16 DIAGNOSIS — R928 Other abnormal and inconclusive findings on diagnostic imaging of breast: Secondary | ICD-10-CM

## 2016-08-22 ENCOUNTER — Other Ambulatory Visit: Payer: Self-pay | Admitting: Internal Medicine

## 2016-08-22 MED FILL — CIPROFLOXACIN HCL 250 MG TA: 250 | 3 days supply | Qty: 6 | Fill #0

## 2016-08-24 ENCOUNTER — Other Ambulatory Visit: Payer: Self-pay | Admitting: Pharmacist

## 2016-08-24 MED ORDER — ADALIMUMAB 40 MG/0.8ML ~~LOC~~ PSKT: 1.0000 | PREFILLED_SYRINGE | SUBCUTANEOUS | 2 refills | Status: DC

## 2016-08-24 MED FILL — HUMIRA 40 MG/0.8ML PSKT: 40 | 28 days supply | Qty: 4 | Fill #0

## 2016-08-24 MED FILL — CIPROFLOXACIN HCL 250 MG TA: 250 | 30 days supply | Qty: 30 | Fill #0

## 2016-08-24 MED FILL — HYDROXYZINE PAM 25 MG CAP: 25 | 90 days supply | Qty: 90 | Fill #0

## 2016-09-12 DIAGNOSIS — E7800 Pure hypercholesterolemia, unspecified: Secondary | ICD-10-CM | POA: Diagnosis not present

## 2016-09-20 MED FILL — YUVAFEM 10 MCG VAGINAL INSE: 10 | 28 days supply | Qty: 12 | Fill #1

## 2016-09-20 MED FILL — HUMIRA 40 MG/0.8ML PSKT: 40 | 28 days supply | Qty: 4 | Fill #1

## 2016-09-25 MED FILL — VENLAFAXINE HCL ER 150 MG C: 150 | 90 days supply | Qty: 90 | Fill #1

## 2016-10-05 MED FILL — ROSUVASTATIN CALCIUM 5 MG T: 5 | 90 days supply | Qty: 90 | Fill #1

## 2016-10-05 MED FILL — ACYCLOVIR 200 MG CAPSULE: 200 | 60 days supply | Qty: 360 | Fill #0

## 2016-10-18 MED FILL — HUMIRA 40 MG/0.8ML PSKT: 40 | 28 days supply | Qty: 4 | Fill #2

## 2016-10-18 MED FILL — CYCLOBENZAPRINE 10 MG TAB: 10 | 30 days supply | Qty: 90 | Fill #1

## 2016-10-18 MED FILL — CELECOXIB 200 MG CAP: 200 | 90 days supply | Qty: 180 | Fill #0

## 2016-10-19 MED FILL — YUVAFEM 10 MCG VAGINAL INSE: 10 | 28 days supply | Qty: 12 | Fill #2

## 2016-11-26 MED FILL — HYDROXYZINE PAM 25 MG CAP: 25 | 90 days supply | Qty: 90 | Fill #1

## 2016-11-26 MED FILL — YUVAFEM 10 MCG VAGINAL INSE: 10 | 28 days supply | Qty: 12 | Fill #3

## 2016-12-15 MED FILL — VENLAFAXINE HCL ER 150 MG C: 150 | 90 days supply | Qty: 90 | Fill #2

## 2016-12-17 MED FILL — ROSUVASTATIN CALCIUM 5 MG T: 5 | 90 days supply | Qty: 90 | Fill #2

## 2016-12-24 ENCOUNTER — Other Ambulatory Visit: Payer: Self-pay | Admitting: Pharmacist

## 2016-12-24 MED ORDER — ADALIMUMAB 40 MG/0.8ML ~~LOC~~ PSKT: 1.0000 | PREFILLED_SYRINGE | SUBCUTANEOUS | 2 refills | Status: DC

## 2016-12-24 MED FILL — HUMIRA 40 MG/0.8ML PSKT: 40 | 28 days supply | Qty: 4 | Fill #0

## 2016-12-25 MED FILL — YUVAFEM 10 MCG VAGINAL INSE: 10 | 28 days supply | Qty: 12 | Fill #4

## 2017-01-15 DIAGNOSIS — M62838 Other muscle spasm: Secondary | ICD-10-CM | POA: Diagnosis not present

## 2017-01-15 DIAGNOSIS — M150 Primary generalized (osteo)arthritis: Secondary | ICD-10-CM | POA: Diagnosis not present

## 2017-01-15 DIAGNOSIS — E663 Overweight: Secondary | ICD-10-CM | POA: Diagnosis not present

## 2017-01-15 DIAGNOSIS — Z79899 Other long term (current) drug therapy: Secondary | ICD-10-CM | POA: Diagnosis not present

## 2017-01-15 DIAGNOSIS — M542 Cervicalgia: Secondary | ICD-10-CM | POA: Diagnosis not present

## 2017-01-15 DIAGNOSIS — Z6826 Body mass index (BMI) 26.0-26.9, adult: Secondary | ICD-10-CM | POA: Diagnosis not present

## 2017-01-15 DIAGNOSIS — M2550 Pain in unspecified joint: Secondary | ICD-10-CM | POA: Diagnosis not present

## 2017-01-15 DIAGNOSIS — M47899 Other spondylosis, site unspecified: Secondary | ICD-10-CM | POA: Diagnosis not present

## 2017-01-15 DIAGNOSIS — M25551 Pain in right hip: Secondary | ICD-10-CM | POA: Diagnosis not present

## 2017-01-15 MED FILL — CYCLOBENZAPRINE 10 MG TAB: 10 | 30 days supply | Qty: 90 | Fill #2

## 2017-01-15 MED FILL — tiZANidine HCL 4 MG TABS: 4 | 20 days supply | Qty: 60 | Fill #0

## 2017-01-15 MED FILL — CELECOXIB 200 MG CAP: 200 | 90 days supply | Qty: 180 | Fill #0

## 2017-01-24 MED FILL — HUMIRA 40 MG/0.8ML PSKT: 40 | 28 days supply | Qty: 4 | Fill #1

## 2017-01-24 MED FILL — YUVAFEM 10 MCG VAGINAL INSE: 10 | 28 days supply | Qty: 12 | Fill #5

## 2017-02-07 DIAGNOSIS — L237 Allergic contact dermatitis due to plants, except food: Secondary | ICD-10-CM | POA: Diagnosis not present

## 2017-02-08 MED FILL — predniSONE 20 MG TABS: 20 | 10 days supply | Qty: 25 | Fill #0

## 2017-02-08 MED FILL — raNITIdine HCL 150 MG TABS: 150 | 15 days supply | Qty: 30 | Fill #0

## 2017-02-18 MED FILL — HYDROXYZINE PAM 25 MG CAP: 25 | 90 days supply | Qty: 90 | Fill #0

## 2017-02-22 MED FILL — tiZANidine HCL 4 MG TABS: 4 | 20 days supply | Qty: 60 | Fill #0

## 2017-02-22 MED FILL — HUMIRA 40 MG/0.8ML PSKT: 40 | 28 days supply | Qty: 4 | Fill #2

## 2017-03-18 MED FILL — ROSUVASTATIN CALCIUM 5 MG T: 5 | 90 days supply | Qty: 90 | Fill #3

## 2017-03-18 MED FILL — YUVAFEM 10 MCG VAGINAL INSE: 10 | 28 days supply | Qty: 12 | Fill #6

## 2017-03-18 MED FILL — VENLAFAXINE HCL ER 150 MG C: 150 | 90 days supply | Qty: 90 | Fill #3

## 2017-04-03 ENCOUNTER — Other Ambulatory Visit: Payer: Self-pay | Admitting: Pharmacist

## 2017-04-03 ENCOUNTER — Other Ambulatory Visit: Payer: Self-pay | Admitting: Internal Medicine

## 2017-04-03 MED ORDER — ADALIMUMAB 40 MG/0.8ML ~~LOC~~ PSKT: 0.8000 mL | PREFILLED_SYRINGE | SUBCUTANEOUS | 2 refills | Status: DC

## 2017-04-03 MED ORDER — ADALIMUMAB 40 MG/0.8ML ~~LOC~~ AJKT: 0.8000 mL | AUTO-INJECTOR | SUBCUTANEOUS | 1 refills | Status: DC

## 2017-04-03 MED FILL — HUMIRA 40 MG/0.8ML PSKT: 40 | 28 days supply | Qty: 4 | Fill #0

## 2017-04-29 ENCOUNTER — Emergency Department (HOSPITAL_COMMUNITY)
Admission: EM | Admit: 2017-04-29 | Discharge: 2017-04-29 | Disposition: A | Discharge status: Home / Self Care | Payer: 59 | Attending: Emergency Medicine | Admitting: Emergency Medicine

## 2017-04-29 ENCOUNTER — Emergency Department (HOSPITAL_COMMUNITY): Payer: 59

## 2017-04-29 ENCOUNTER — Encounter (HOSPITAL_COMMUNITY): Payer: Self-pay | Admitting: Emergency Medicine

## 2017-04-29 DIAGNOSIS — R109 Unspecified abdominal pain: Secondary | ICD-10-CM

## 2017-04-29 DIAGNOSIS — R1030 Lower abdominal pain, unspecified: Secondary | ICD-10-CM | POA: Insufficient documentation

## 2017-04-29 DIAGNOSIS — Z79899 Other long term (current) drug therapy: Secondary | ICD-10-CM | POA: Diagnosis not present

## 2017-04-29 DIAGNOSIS — R55 Syncope and collapse: Secondary | ICD-10-CM | POA: Diagnosis not present

## 2017-04-29 DIAGNOSIS — R1012 Left upper quadrant pain: Secondary | ICD-10-CM | POA: Diagnosis not present

## 2017-04-29 LAB — BASIC METABOLIC PANEL
ANION GAP: 8 (ref 5–15)
BUN: 16 mg/dL (ref 6–20)
CO2: 22 mmol/L (ref 22–32)
Calcium: 9.1 mg/dL (ref 8.9–10.3)
Chloride: 105 mmol/L (ref 101–111)
Creatinine, Ser: 0.67 mg/dL (ref 0.44–1.00)
GFR calc Af Amer: >60 mL/min (ref >60)
Glucose, Bld: 118 mg/dL — ABNORMAL HIGH (ref 65–99)
POTASSIUM: 3.6 mmol/L (ref 3.5–5.1)
SODIUM: 135 mmol/L (ref 135–145)

## 2017-04-29 LAB — CBC
HCT: 37.1 % (ref 36.0–46.0)
Hemoglobin: 12.5 g/dL (ref 12.0–15.0)
MCH: 32.7 pg (ref 26.0–34.0)
MCHC: 33.7 g/dL (ref 30.0–36.0)
MCV: 97.1 fL (ref 78.0–100.0)
Platelets: 298 10*3/uL (ref 150–400)
RBC: 3.82 MIL/uL — AB (ref 3.87–5.11)
RDW: 12.1 % (ref 11.5–15.5)
WBC: 12.8 10*3/uL — AB (ref 4.0–10.5)

## 2017-04-29 MED ORDER — IOPAMIDOL (ISOVUE-300) INJECTION 61%
INTRAVENOUS | Status: AC
  Filled 2017-04-29: qty 100

## 2017-04-29 MED ORDER — IOPAMIDOL (ISOVUE-300) INJECTION 61%
100.0000 mL | Freq: Once | INTRAVENOUS | Status: AC | PRN
  Administered 2017-04-29: 100 mL via INTRAVENOUS

## 2017-04-29 MED FILL — CELECOXIB 200 MG CAPS: 200 | 90 days supply | Qty: 180 | Fill #0

## 2017-04-29 MED FILL — tiZANidine HCL 4 MG TABS: 4 | 30 days supply | Qty: 60 | Fill #0

## 2017-04-29 MED FILL — YUVAFEM 10 MCG VAGINAL INSE: 10 | 28 days supply | Qty: 12 | Fill #7

## 2017-04-29 NOTE — ED Triage Notes (Signed)
Brought in by EMS from home with c/o lower abdominal pain without N/V/D, and also c/o near-syncope.  Pt reported that she woke up to a severe abdominal pain--- went to use bathroom and while using bathroom, she has had near-syncope.

## 2017-04-29 NOTE — ED Provider Notes (Signed)
[facility] DEPT Provider Note   CSN: [id number] Arrival date & time: 04/29/17  0107     History   Chief Complaint Chief Complaint  Patient presents with  . Abdominal Pain  . Near Syncope    HPI Adrienne Campbell is a 56 y.o. female.  HPI Patient is a 56 year old female who presents the emergency department with acute onset crampy lower abdominal pain without nausea or vomiting.  She does report that she awoke to go use the restroom began having diarrhea and then felt weak and lightheaded.  She went down to the ground and up having an accident a bowel movement on the ground because she felt so weak.  She had no preceding palpitations or chest pain.  She has no chest pain at this time.  No shortness of breath.  Reports ongoing crampy lower abdominal pain.  She felt otherwise normal when she went to bed.  She denies constipation this week.  She reports after her husband found her on the ground she then was able to go back onto the toilet and end up having voluminous bowel movement.  Past Medical History:  Diagnosis Date  . Arthritis   . Depression   . Environmental and seasonal allergies   . Hyperlipidemia   . Insomnia   . Pneumonia    hx of  . PONV (postoperative nausea and vomiting)    Nausea    Patient Active Problem List   Diagnosis Date Noted  . Primary osteoarthritis of right knee 08/11/2014  . Arthritis of knee 08/11/2014  . Arthritis of left hip 06/28/2014  . Arthritis, hip 06/28/2014  . Insomnia 03/03/2012  . 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 Osteoarthritis of multiple joints 12/04/2010  . Allergic rhinitis 09/03/2010  . Hypercholesteremia 09/04/2007  . Postmenopausal HRT (hormone replacement therapy) 09/03/2005  . Depression 09/03/2001    Past Surgical History:  Procedure Laterality Date  . ABDOMINAL HYSTERECTOMY    . COLONOSCOPY    . TOTAL HIP ARTHROPLASTY Left 06/28/2014   Procedure: LEFT TOTAL HIP ARTHROPLASTY;  Surgeon: Kerin Salen, MD;   Location: [place];  Service: Orthopedics;  Laterality: Left;  . TOTAL KNEE ARTHROPLASTY Right 08/11/2014   Procedure: RIGHT TOTAL KNEE ARTHROPLASTY;  Surgeon: Kerin Salen, MD;  Location: [place];  Service: Orthopedics;  Laterality: Right;    OB History    No data available       Home Medications    Prior to Admission medications   Medication Sig Start Date End Date Taking? Authorizing Provider  acetaminophen (TYLENOL) 650 MG CR tablet Take 1,300 mg by mouth 2 (two) times daily.    [provider]  Adalimumab (HUMIRA) 40 MG/0.8ML PSKT Inject 0.8 mLs (40 mg total) into the skin once a week. 04/03/17   Tresa Garter, MD  celecoxib (CELEBREX) 200 MG capsule Take 200 mg by mouth 2 (two) times daily.    [provider]  cetirizine (ZYRTEC) 10 MG tablet Take 1 tablet (10 mg total) by mouth daily. 08/22/12   Zenia Resides, MD  cholecalciferol (VITAMIN D) 1000 UNITS tablet Take 1,000 Units by mouth 2 (two) times daily.    [provider]  cyclobenzaprine (FLEXERIL) 10 MG tablet Take 10 mg by mouth 3 (three) times daily as needed for muscle spasms.    [provider]  Estradiol [facility]) Place 10 mg vaginally as directed.    [provider]  hydrOXYzine (VISTARIL) 25 MG capsule Take 1 capsule (25 mg total)  by mouth at bedtime. Used for sleep 08/22/12   Zenia Resides, MD  Misc Natural Products [facility] COMPLEX PO) Take 1 capsule by mouth daily.     [provider]  Multiple Vitamins-Minerals (MULTIVITAMIN & MINERAL PO) Take by mouth at bedtime.    [provider]  pravastatin (PRAVACHOL) 40 MG tablet Take 40 mg by mouth daily.    [provider]  sennosides-docusate sodium (SENOKOT-S) 8.6-50 MG tablet Take 1 tablet by mouth daily.    [provider]  venlafaxine XR (EFFEXOR XR) 150 MG 24 hr capsule Take 1 capsule (150 mg total) by mouth daily. 08/22/12   Zenia Resides, MD    Family History History  reviewed. No pertinent family history.  Social History Social History  Substance Use Topics  . Smoking status: Never Smoker  . Smokeless tobacco: Never Used  . Alcohol use No     Comment: RARE     Allergies   Nsaids; Onion; Sulfa antibiotics; Codeine; Fluticasone; Gluten meal; Oxycodone; and Tramadol   Review of Systems Review of Systems  All other systems reviewed and are negative.    Physical Exam Updated Vital Signs BP 127/78   Pulse 67   Temp (!) 97.5 F (36.4 C) (Oral)   Resp 18   SpO2 97%   Physical Exam  Constitutional: She is oriented to person, place, and time. She appears well-developed and well-nourished. No distress.  HENT:  Head: Normocephalic and atraumatic.  Eyes: EOM are normal.  Neck: Normal range of motion.  Cardiovascular: Normal rate, regular rhythm and normal heart sounds.   Pulmonary/Chest: Effort normal and breath sounds normal.  Abdominal: Soft. She exhibits no distension.  Mild lower abdominal tenderness  Musculoskeletal: Normal range of motion.  Neurological: She is alert and oriented to person, place, and time.  Skin: Skin is warm and dry.  Psychiatric: She has a normal mood and affect. Judgment normal.  Nursing note and vitals reviewed.    ED Treatments / Results  Labs (all labs ordered are listed, but only abnormal results are displayed) Labs Reviewed  CBC - Abnormal; Notable for the following:       Result Value   WBC 12.8 (*)    RBC 3.82 (*)    All other components within normal limits  BASIC METABOLIC PANEL - Abnormal; Notable for the following:    Glucose, Bld 118 (*)    All other components within normal limits    EKG  EKG Interpretation None       Radiology Ct Abdomen Pelvis W Contrast  Result Date: 04/29/2017 CLINICAL DATA:  Acute lower abdominal pain. EXAM: CT ABDOMEN AND PELVIS WITH CONTRAST TECHNIQUE: Multidetector CT imaging of the abdomen and pelvis was performed using the standard protocol following  bolus administration of intravenous contrast. CONTRAST:  124mL ISOVUE-300 IOPAMIDOL (ISOVUE-300) INJECTION 61% COMPARISON:  Radiographs earlier this day. FINDINGS: Lower chest: The lung bases are clear. Hepatobiliary: No focal hepatic lesion. Gallbladder physiologically distended, no calcified stone. No biliary dilatation. Pancreas: No ductal dilatation or inflammation. Spleen: Normal in size without focal abnormality. Adrenals/Urinary Tract: No adrenal nodule. Homogeneous renal enhancement with symmetric excretion on delayed phase imaging. No hydronephrosis a perinephric edema. Both ureters are decompressed. Urinary bladder is physiologically distended. Stomach/Bowel: Stomach is within normal limits. Appendix appears normal. No evidence of bowel wall thickening, distention, or inflammatory changes. No significant diverticular changes. Vascular/Lymphatic: Moderate aortic atherosclerosis. No aneurysm. No abdominal or pelvic adenopathy. Reproductive: Status post hysterectomy. No adnexal masses. Other:  No free air, free fluid, or intra-abdominal fluid collection. Musculoskeletal: Multilevel degenerative change in the lumbar spine. There are no acute or suspicious osseous abnormalities. Left hip arthroplasty. IMPRESSION: 1. No acute abnormality or explanation for abdominal pain. 2.  Aortic Atherosclerosis (ICD10-I70.0). Electronically Signed   By: Jeb Levering M.D.   On: 04/29/2017 04:31   Dg Abd 2 Views  Result Date: 04/29/2017 CLINICAL DATA:  Awoke with abdominal pain.  Abdominal cramping. EXAM: ABDOMEN - 2 VIEW COMPARISON:  Radiographs 06/29/2014 FINDINGS: The bowel gas pattern is normal. No dilated bowel loops. There is no evidence of free air. Small volume of colonic stool. No radio-opaque calculi. Left hip arthroplasty. The lung bases are clear. IMPRESSION: Negative abdominal radiographs. Electronically Signed   By: Jeb Levering M.D.   On: 04/29/2017 01:56    Procedures Procedures (including  critical care time)  Medications Ordered in ED Medications  iopamidol (ISOVUE-300) 61 % injection (not administered)  iopamidol (ISOVUE-300) 61 % injection 100 mL (100 mLs Intravenous Contrast Given 04/29/17 0415)     Initial Impression / Assessment and Plan / ED Course  I have reviewed the triage vital signs and the nursing notes.  Pertinent labs & imaging results that were available during my care of the patient were reviewed by me and considered in my medical decision making (see chart for details).     Because of ongoing lower abdominal tenderness CT imaging was performed which structures no significant abnormalities.  The patient has normal vital signs in the emergency department and feels much better at this time.  She was offered pain medication on several occasions did not want any.  No vomiting.  Feels good.  Patient understands return to the ER for new or worsening symptoms  Final Clinical Impressions(s) / ED Diagnoses   Final diagnoses:  Abdominal cramping  Acute abdominal pain    New Prescriptions New Prescriptions   No medications on file     Jola Schmidt, MD 04/29/17 [phone]

## 2017-04-29 NOTE — ED Notes (Signed)
Bed: TV81 Expected date:  Expected time:  Means of arrival:  Comments: EMS 58 F near syncopal

## 2017-04-30 ENCOUNTER — Other Ambulatory Visit: Payer: Self-pay | Admitting: Pharmacist

## 2017-04-30 MED ORDER — ADALIMUMAB 40 MG/0.8ML ~~LOC~~ PSKT: 0.8000 mL | PREFILLED_SYRINGE | SUBCUTANEOUS | 6 refills | Status: DC

## 2017-05-02 MED FILL — HUMIRA 40 MG/0.8ML PSKT: 40 | 28 days supply | Qty: 4 | Fill #0

## 2017-05-03 DIAGNOSIS — K5900 Constipation, unspecified: Secondary | ICD-10-CM | POA: Diagnosis not present

## 2017-05-03 DIAGNOSIS — R1030 Lower abdominal pain, unspecified: Secondary | ICD-10-CM | POA: Diagnosis not present

## 2017-05-03 DIAGNOSIS — Z1211 Encounter for screening for malignant neoplasm of colon: Secondary | ICD-10-CM | POA: Diagnosis not present

## 2017-05-03 MED FILL — HYOSCYAMINE 0.125 MG TAB SL: 0.125 | 5 days supply | Qty: 30 | Fill #0

## 2017-05-09 MED FILL — HYDROXYZINE PAM 25 MG CAP: 25 | 90 days supply | Qty: 90 | Fill #0

## 2017-05-30 DIAGNOSIS — R1030 Lower abdominal pain, unspecified: Secondary | ICD-10-CM | POA: Diagnosis not present

## 2017-05-31 DIAGNOSIS — Z1211 Encounter for screening for malignant neoplasm of colon: Secondary | ICD-10-CM | POA: Diagnosis not present

## 2017-05-31 DIAGNOSIS — K5900 Constipation, unspecified: Secondary | ICD-10-CM | POA: Diagnosis not present

## 2017-06-17 MED FILL — YUVAFEM 10 MCG VAGINAL INSE: 10 | 28 days supply | Qty: 12 | Fill #8

## 2017-06-17 MED FILL — HUMIRA 40 MG/0.8ML PSKT: 40 | 28 days supply | Qty: 4 | Fill #1

## 2017-06-17 MED FILL — tiZANidine HCL 4 MG TABS: 4 | 20 days supply | Qty: 60 | Fill #0

## 2017-06-17 MED FILL — ROSUVASTATIN CALCIUM 5 MG T: 5 | 90 days supply | Qty: 90 | Fill #0

## 2017-06-17 MED FILL — VENLAFAXINE HCL ER 150 MG C: 150 | 90 days supply | Qty: 90 | Fill #0

## 2017-07-05 DIAGNOSIS — E7800 Pure hypercholesterolemia, unspecified: Secondary | ICD-10-CM | POA: Diagnosis not present

## 2017-07-05 DIAGNOSIS — B009 Herpesviral infection, unspecified: Secondary | ICD-10-CM | POA: Diagnosis not present

## 2017-07-05 DIAGNOSIS — Z131 Encounter for screening for diabetes mellitus: Secondary | ICD-10-CM | POA: Diagnosis not present

## 2017-07-05 DIAGNOSIS — M479 Spondylosis, unspecified: Secondary | ICD-10-CM | POA: Diagnosis not present

## 2017-07-05 DIAGNOSIS — K5900 Constipation, unspecified: Secondary | ICD-10-CM | POA: Diagnosis not present

## 2017-07-05 DIAGNOSIS — Z0000 Encounter for general adult medical examination without abnormal findings: Secondary | ICD-10-CM | POA: Diagnosis not present

## 2017-07-05 DIAGNOSIS — F321 Major depressive disorder, single episode, moderate: Secondary | ICD-10-CM | POA: Diagnosis not present

## 2017-07-05 DIAGNOSIS — G4700 Insomnia, unspecified: Secondary | ICD-10-CM | POA: Diagnosis not present

## 2017-07-16 DIAGNOSIS — Z6825 Body mass index (BMI) 25.0-25.9, adult: Secondary | ICD-10-CM | POA: Diagnosis not present

## 2017-07-16 DIAGNOSIS — E663 Overweight: Secondary | ICD-10-CM | POA: Diagnosis not present

## 2017-07-16 DIAGNOSIS — M2550 Pain in unspecified joint: Secondary | ICD-10-CM | POA: Diagnosis not present

## 2017-07-16 DIAGNOSIS — M47899 Other spondylosis, site unspecified: Secondary | ICD-10-CM | POA: Diagnosis not present

## 2017-07-16 DIAGNOSIS — Z79899 Other long term (current) drug therapy: Secondary | ICD-10-CM | POA: Diagnosis not present

## 2017-07-16 DIAGNOSIS — M150 Primary generalized (osteo)arthritis: Secondary | ICD-10-CM | POA: Diagnosis not present

## 2017-07-19 MED FILL — ESTRADIOL 10 MCG TABS: 10 | 28 days supply | Qty: 12 | Fill #0

## 2017-07-24 MED FILL — CELECOXIB 200 MG CAPS: 200 | 90 days supply | Qty: 180 | Fill #0

## 2017-07-25 MED FILL — HUMIRA 40 MG/0.8ML PSKT: 40 | 28 days supply | Qty: 4 | Fill #2

## 2017-08-21 MED FILL — HUMIRA 40 MG/0.8ML PSKT: 40 | 28 days supply | Qty: 4 | Fill #3

## 2017-08-22 MED FILL — HYDROXYZINE PAM 25 MG CAP: 25 | 90 days supply | Qty: 90 | Fill #0

## 2017-09-09 MED FILL — tiZANidine HCL 4 MG TABS: 4 | 30 days supply | Qty: 60 | Fill #0

## 2017-09-20 MED FILL — VENLAFAXINE HCL ER 150 MG C: 150 | 90 days supply | Qty: 90 | Fill #0

## 2017-09-20 MED FILL — ROSUVASTATIN CALCIUM 5 MG T: 5 | 90 days supply | Qty: 90 | Fill #0

## 2017-10-03 MED FILL — HUMIRA 40 MG/0.8ML PSKT: 40 | 28 days supply | Qty: 4 | Fill #4

## 2017-10-04 DIAGNOSIS — N393 Stress incontinence (female) (male): Secondary | ICD-10-CM | POA: Diagnosis not present

## 2017-10-04 DIAGNOSIS — N3020 Other chronic cystitis without hematuria: Secondary | ICD-10-CM | POA: Diagnosis not present

## 2017-10-04 DIAGNOSIS — N952 Postmenopausal atrophic vaginitis: Secondary | ICD-10-CM | POA: Diagnosis not present

## 2017-10-04 DIAGNOSIS — R3912 Poor urinary stream: Secondary | ICD-10-CM | POA: Diagnosis not present

## 2017-10-04 MED FILL — ESTRADIOL 10 MCG TABS: 10 | 84 days supply | Qty: 24 | Fill #0

## 2017-10-08 DIAGNOSIS — M6289 Other specified disorders of muscle: Secondary | ICD-10-CM | POA: Diagnosis not present

## 2017-10-08 DIAGNOSIS — N393 Stress incontinence (female) (male): Secondary | ICD-10-CM | POA: Diagnosis not present

## 2017-10-08 DIAGNOSIS — M6281 Muscle weakness (generalized): Secondary | ICD-10-CM | POA: Diagnosis not present

## 2017-10-08 DIAGNOSIS — M533 Sacrococcygeal disorders, not elsewhere classified: Secondary | ICD-10-CM | POA: Diagnosis not present

## 2017-10-08 DIAGNOSIS — M62838 Other muscle spasm: Secondary | ICD-10-CM | POA: Diagnosis not present

## 2017-10-14 DIAGNOSIS — M6289 Other specified disorders of muscle: Secondary | ICD-10-CM | POA: Diagnosis not present

## 2017-10-14 DIAGNOSIS — M62838 Other muscle spasm: Secondary | ICD-10-CM | POA: Diagnosis not present

## 2017-10-14 DIAGNOSIS — N393 Stress incontinence (female) (male): Secondary | ICD-10-CM | POA: Diagnosis not present

## 2017-10-14 DIAGNOSIS — N3020 Other chronic cystitis without hematuria: Secondary | ICD-10-CM | POA: Diagnosis not present

## 2017-10-14 DIAGNOSIS — M6281 Muscle weakness (generalized): Secondary | ICD-10-CM | POA: Diagnosis not present

## 2017-10-24 MED FILL — HUMIRA 40 MG/0.8ML PSKT: 40 | 28 days supply | Qty: 4 | Fill #5

## 2017-10-24 MED FILL — CELECOXIB 200 MG CAPSULE: 200 | 90 days supply | Qty: 180 | Fill #0

## 2017-10-25 MED FILL — tiZANidine HCL 4 MG TABS: 4 | 20 days supply | Qty: 60 | Fill #0

## 2017-10-28 IMAGING — CT CT ABD-PELV W/ CM
2 of 5 series · 16 of 46 positions shown, 18 images · IV contrast (ISOVUE)
Comparison: Radiographs earlier this day.

CLINICAL DATA: Acute lower abdominal pain.

EXAM:
CT ABDOMEN AND PELVIS WITH CONTRAST
TECHNIQUE: Multidetector CT imaging of the abdomen and pelvis was performed
using the standard protocol following bolus administration of
intravenous contrast.
CONTRAST:  100mL 17TL6D-B00 IOPAMIDOL (17TL6D-B00) INJECTION 61%

[Series 2: abd/pel with · axial · 0.74mm/px · z∈[-470,-40]mm · 13 of 98 slices shown, 15 images]
[im 6/98  soft-tissue]
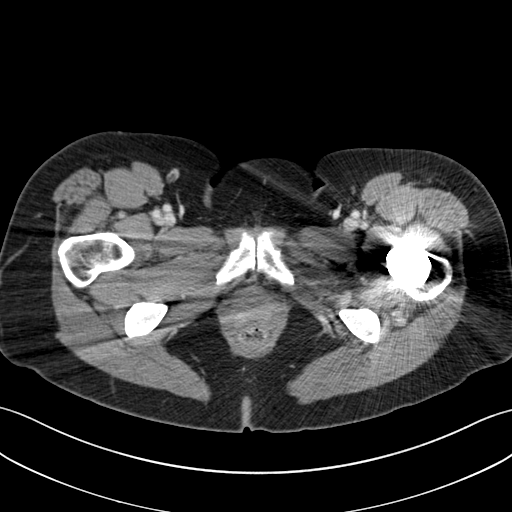
[im 6/98  bone]
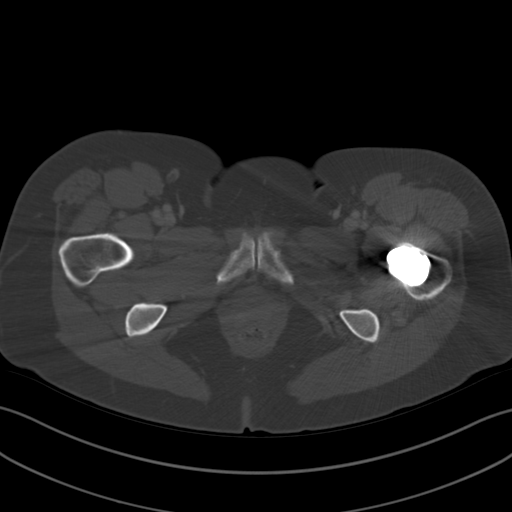
[im 12/98  soft-tissue]
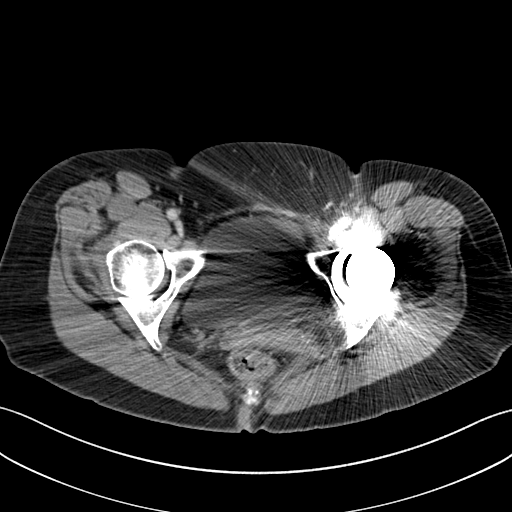
[im 23/98  soft-tissue]
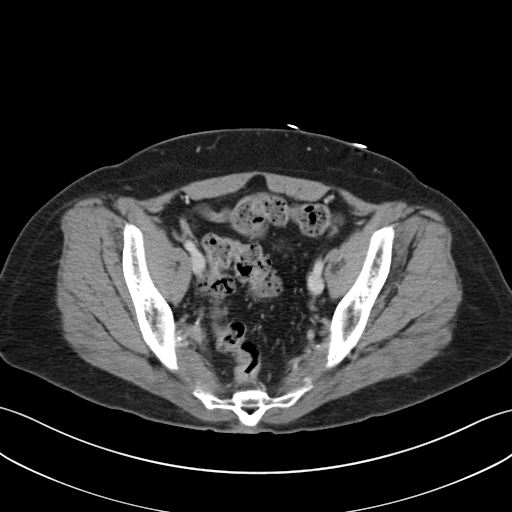
[im 29/98  soft-tissue]
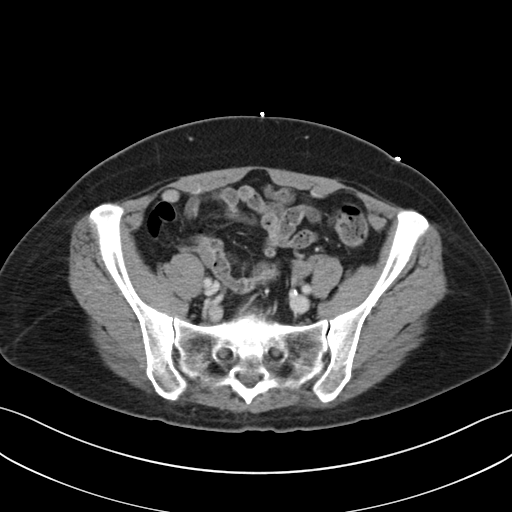
[im 35/98  soft-tissue]
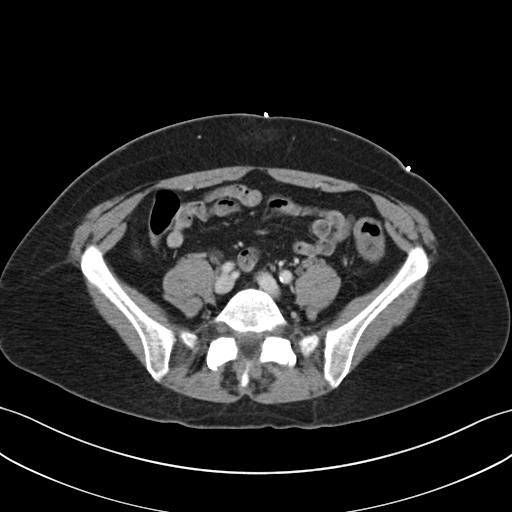
[im 40/98  soft-tissue]
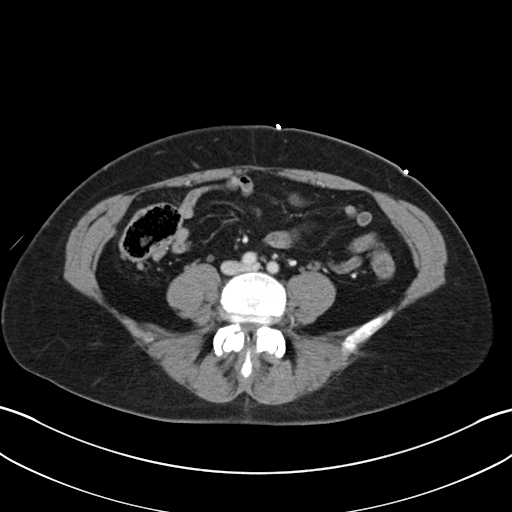
[im 52/98  soft-tissue]
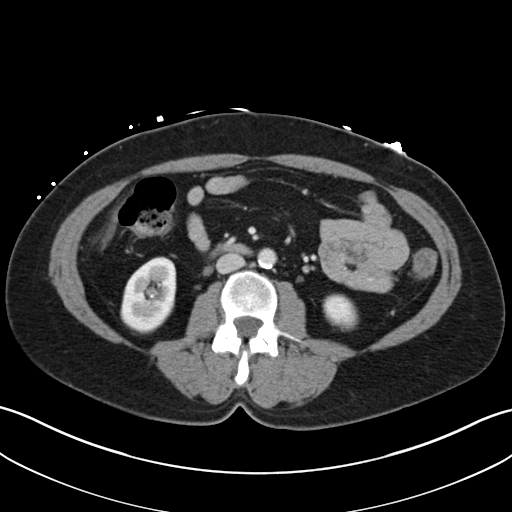
[im 58/98  soft-tissue]
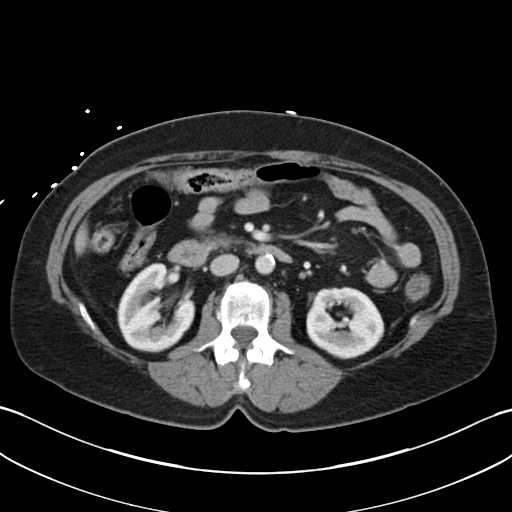
[im 63/98  soft-tissue]
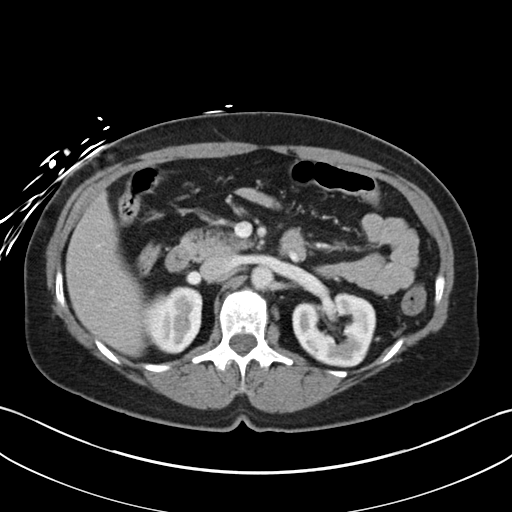
[im 63/98  bone]
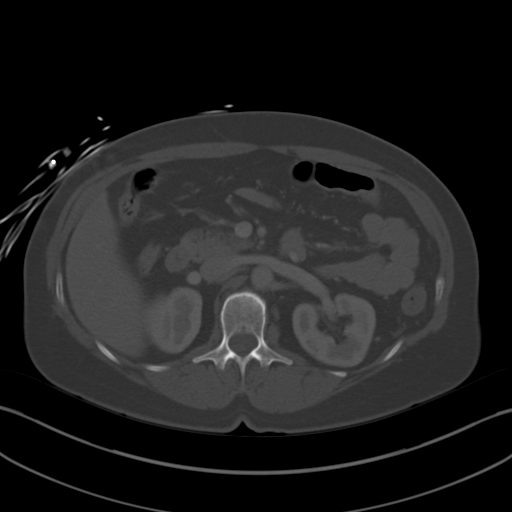
[im 69/98  soft-tissue]
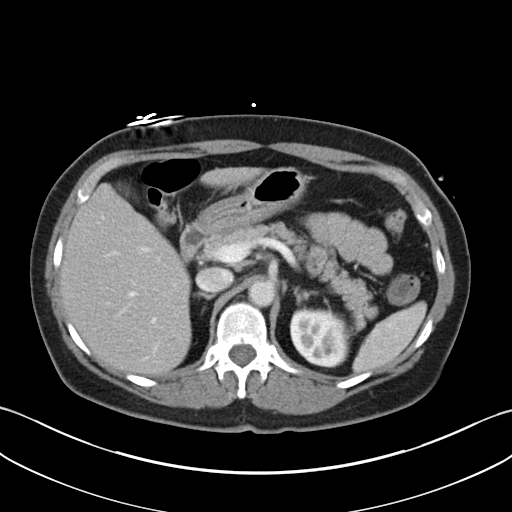
[im 75/98  soft-tissue]
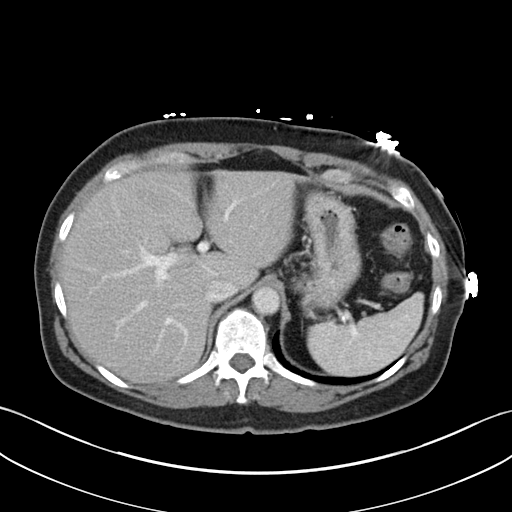
[im 86/98  soft-tissue]
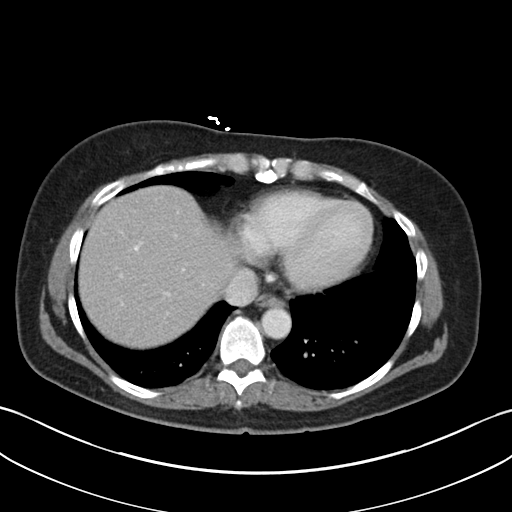
[im 92/98  soft-tissue]
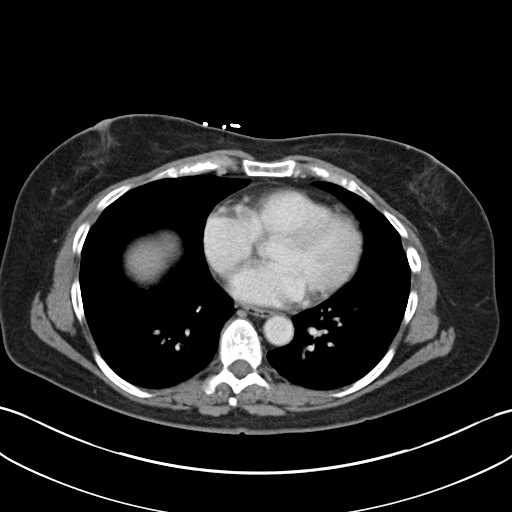

[Series 3: coronal a/|p · coronal · 0.81mm/px · 3 of 111 slices shown]
[im 37/111  soft-tissue]
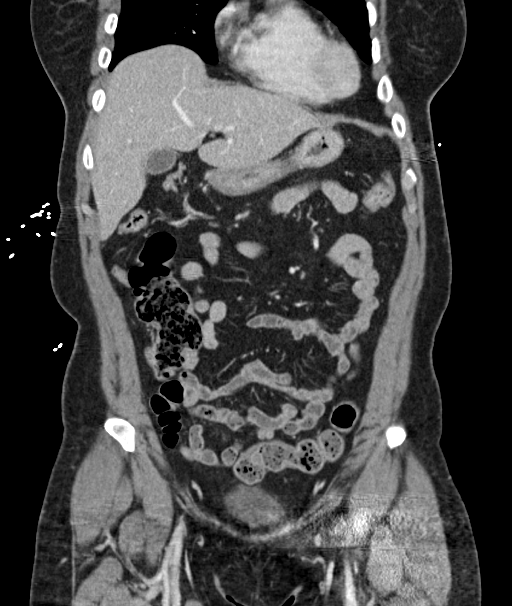
[im 49/111  soft-tissue]
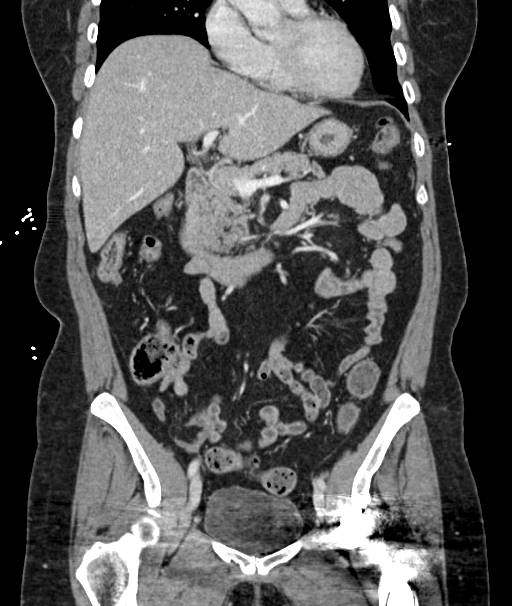
[im 62/111  soft-tissue]
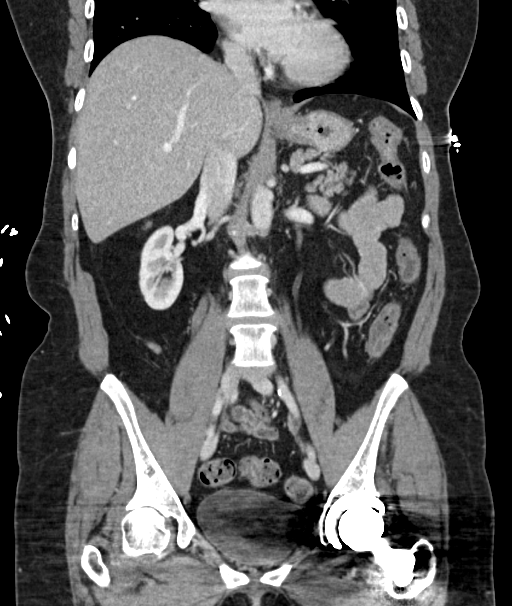

[16 of 46 positions shown; findings below may reference images not displayed]

FINDINGS: Lower chest: The lung bases are clear.

Hepatobiliary: No focal hepatic lesion. Gallbladder physiologically
distended, no calcified stone. No biliary dilatation.

Pancreas: No ductal dilatation or inflammation.

Spleen: Normal in size without focal abnormality.

Adrenals/Urinary Tract: No adrenal nodule. Homogeneous renal
enhancement with symmetric excretion on delayed phase imaging. No
hydronephrosis a perinephric edema. Both ureters are decompressed.
Urinary bladder is physiologically distended.

Stomach/Bowel: Stomach is within normal limits. Appendix appears
normal. No evidence of bowel wall thickening, distention, or
inflammatory changes. No significant diverticular changes.

Vascular/Lymphatic: Moderate aortic atherosclerosis. No aneurysm. No
abdominal or pelvic adenopathy.

Reproductive: Status post hysterectomy. No adnexal masses.

Other: No free air, free fluid, or intra-abdominal fluid collection.

Musculoskeletal: Multilevel degenerative change in the lumbar spine.
There are no acute or suspicious osseous abnormalities. Left hip
arthroplasty.
IMPRESSION: 1. No acute abnormality or explanation for abdominal pain.
2.  Aortic Atherosclerosis ([id number]).

## 2017-11-18 MED FILL — HYDROXYZINE PAM 25 MG CAP: 25 | 90 days supply | Qty: 90 | Fill #1

## 2017-12-03 DIAGNOSIS — T84021S Dislocation of internal left hip prosthesis, sequela: Secondary | ICD-10-CM | POA: Diagnosis not present

## 2017-12-03 DIAGNOSIS — S73005A Unspecified dislocation of left hip, initial encounter: Secondary | ICD-10-CM | POA: Diagnosis not present

## 2017-12-03 DIAGNOSIS — Z96642 Presence of left artificial hip joint: Secondary | ICD-10-CM | POA: Diagnosis not present

## 2017-12-03 DIAGNOSIS — T84029A Dislocation of unspecified internal joint prosthesis, initial encounter: Secondary | ICD-10-CM | POA: Diagnosis not present

## 2017-12-03 DIAGNOSIS — Z471 Aftercare following joint replacement surgery: Secondary | ICD-10-CM | POA: Diagnosis not present

## 2017-12-03 DIAGNOSIS — M25552 Pain in left hip: Secondary | ICD-10-CM | POA: Diagnosis not present

## 2017-12-03 DIAGNOSIS — T84021A Dislocation of internal left hip prosthesis, initial encounter: Secondary | ICD-10-CM | POA: Diagnosis not present

## 2017-12-03 DIAGNOSIS — Z96649 Presence of unspecified artificial hip joint: Secondary | ICD-10-CM

## 2017-12-03 DIAGNOSIS — S79912A Unspecified injury of left hip, initial encounter: Secondary | ICD-10-CM | POA: Diagnosis not present

## 2017-12-11 DIAGNOSIS — M25552 Pain in left hip: Secondary | ICD-10-CM | POA: Diagnosis not present

## 2017-12-11 DIAGNOSIS — M545 Low back pain: Secondary | ICD-10-CM | POA: Diagnosis not present

## 2017-12-11 MED FILL — VENLAFAXINE HCL ER 150 MG C: 150 | 90 days supply | Qty: 90 | Fill #1

## 2017-12-16 DIAGNOSIS — M533 Sacrococcygeal disorders, not elsewhere classified: Secondary | ICD-10-CM | POA: Diagnosis not present

## 2017-12-17 DIAGNOSIS — M25552 Pain in left hip: Secondary | ICD-10-CM | POA: Diagnosis not present

## 2017-12-17 DIAGNOSIS — Z96642 Presence of left artificial hip joint: Secondary | ICD-10-CM | POA: Diagnosis not present

## 2017-12-19 MED FILL — HUMIRA 40 MG/0.8ML PSKT: 40 | 28 days supply | Qty: 4 | Fill #6

## 2017-12-19 MED FILL — ROSUVASTATIN CALCIUM 5 MG T: 5 | 90 days supply | Qty: 90 | Fill #1

## 2017-12-23 DIAGNOSIS — Z96642 Presence of left artificial hip joint: Secondary | ICD-10-CM | POA: Diagnosis not present

## 2017-12-23 DIAGNOSIS — M25552 Pain in left hip: Secondary | ICD-10-CM | POA: Diagnosis not present

## 2017-12-25 DIAGNOSIS — M25552 Pain in left hip: Secondary | ICD-10-CM | POA: Diagnosis not present

## 2017-12-25 DIAGNOSIS — Z96642 Presence of left artificial hip joint: Secondary | ICD-10-CM | POA: Diagnosis not present

## 2018-01-01 DIAGNOSIS — M25552 Pain in left hip: Secondary | ICD-10-CM | POA: Diagnosis not present

## 2018-01-01 DIAGNOSIS — Z96642 Presence of left artificial hip joint: Secondary | ICD-10-CM | POA: Diagnosis not present

## 2018-01-01 DIAGNOSIS — M533 Sacrococcygeal disorders, not elsewhere classified: Secondary | ICD-10-CM | POA: Diagnosis not present

## 2018-01-06 DIAGNOSIS — M25552 Pain in left hip: Secondary | ICD-10-CM | POA: Diagnosis not present

## 2018-01-06 DIAGNOSIS — Z96642 Presence of left artificial hip joint: Secondary | ICD-10-CM | POA: Diagnosis not present

## 2018-01-06 DIAGNOSIS — M47899 Other spondylosis, site unspecified: Secondary | ICD-10-CM | POA: Diagnosis not present

## 2018-01-06 DIAGNOSIS — M2550 Pain in unspecified joint: Secondary | ICD-10-CM | POA: Diagnosis not present

## 2018-01-06 DIAGNOSIS — Z6825 Body mass index (BMI) 25.0-25.9, adult: Secondary | ICD-10-CM | POA: Diagnosis not present

## 2018-01-06 DIAGNOSIS — M150 Primary generalized (osteo)arthritis: Secondary | ICD-10-CM | POA: Diagnosis not present

## 2018-01-06 DIAGNOSIS — E663 Overweight: Secondary | ICD-10-CM | POA: Diagnosis not present

## 2018-01-06 DIAGNOSIS — Z79899 Other long term (current) drug therapy: Secondary | ICD-10-CM | POA: Diagnosis not present

## 2018-01-06 DIAGNOSIS — Z6824 Body mass index (BMI) 24.0-24.9, adult: Secondary | ICD-10-CM | POA: Diagnosis not present

## 2018-01-06 DIAGNOSIS — M62838 Other muscle spasm: Secondary | ICD-10-CM | POA: Diagnosis not present

## 2018-01-08 DIAGNOSIS — M25552 Pain in left hip: Secondary | ICD-10-CM | POA: Diagnosis not present

## 2018-01-08 DIAGNOSIS — Z96642 Presence of left artificial hip joint: Secondary | ICD-10-CM | POA: Diagnosis not present

## 2018-01-15 DIAGNOSIS — Z96642 Presence of left artificial hip joint: Secondary | ICD-10-CM | POA: Diagnosis not present

## 2018-01-15 DIAGNOSIS — M25552 Pain in left hip: Secondary | ICD-10-CM | POA: Diagnosis not present

## 2018-01-15 MED FILL — tiZANidine HCL 4 MG TABS: 4 | 30 days supply | Qty: 90 | Fill #0

## 2018-01-15 MED FILL — CELECOXIB 200 MG CAPSULE: 200 | 90 days supply | Qty: 180 | Fill #0

## 2018-01-15 MED FILL — ESTRADIOL 10 MCG TABS: 10 | 84 days supply | Qty: 24 | Fill #1

## 2018-01-16 ENCOUNTER — Telehealth: Payer: Self-pay | Admitting: Pharmacist

## 2018-01-16 NOTE — Telephone Encounter (Signed)
Called patient to schedule an appointment for the [facility]. I was unable to reach the patient so I left a HIPAA-compliant message requesting that the patient return my call. Will send a secure email.

## 2018-01-17 ENCOUNTER — Other Ambulatory Visit: Payer: Self-pay | Admitting: Pharmacist

## 2018-01-17 MED ORDER — ADALIMUMAB 40 MG/0.8ML ~~LOC~~ PSKT: 0.8000 mL | PREFILLED_SYRINGE | SUBCUTANEOUS | 5 refills | Status: DC

## 2018-01-24 DIAGNOSIS — M533 Sacrococcygeal disorders, not elsewhere classified: Secondary | ICD-10-CM | POA: Diagnosis not present

## 2018-01-30 MED FILL — HUMIRA 40 MG/0.8ML PSKT: 40 | 28 days supply | Qty: 4 | Fill #0

## 2018-02-18 DIAGNOSIS — M533 Sacrococcygeal disorders, not elsewhere classified: Secondary | ICD-10-CM | POA: Diagnosis not present

## 2018-02-24 MED FILL — HYDROXYZINE PAM 25 MG CAP: 25 | 90 days supply | Qty: 90 | Fill #0

## 2018-03-10 MED FILL — VENLAFAXINE HCL ER 150 MG C: 150 | 90 days supply | Qty: 90 | Fill #2

## 2018-03-10 MED FILL — HUMIRA 40 MG/0.8ML PSKT: 40 | 28 days supply | Qty: 4 | Fill #1

## 2018-03-10 MED FILL — ROSUVASTATIN CALCIUM 5 MG T: 5 | 90 days supply | Qty: 90 | Fill #2

## 2018-03-24 MED FILL — tiZANidine HCL 4 MG TABS: 4 | 30 days supply | Qty: 90 | Fill #0

## 2018-04-14 MED FILL — HUMIRA 40 MG/0.8ML PSKT: 40 | 28 days supply | Qty: 4 | Fill #2

## 2018-04-14 MED FILL — CELECOXIB 200 MG CAP: 200 | 90 days supply | Qty: 180 | Fill #1

## 2018-05-12 MED FILL — HYDROXYZINE PAM 25 MG CAP: 25 | 90 days supply | Qty: 90 | Fill #1

## 2018-05-12 MED FILL — ESTRADIOL 10 MCG TABS: 10 | 84 days supply | Qty: 24 | Fill #2

## 2018-05-16 MED FILL — HUMIRA 40 MG/0.8ML PSKT: 40 | 28 days supply | Qty: 4 | Fill #3

## 2018-06-10 MED FILL — VENLAFAXINE HCL ER 150 MG C: 150 | 90 days supply | Qty: 90 | Fill #0

## 2018-06-10 MED FILL — ACYCLOVIR 200 MG CAP: 200 | 90 days supply | Qty: 360 | Fill #0

## 2018-06-23 MED FILL — HUMIRA 40 MG/0.8ML PSKT: 40 | 28 days supply | Qty: 4 | Fill #4

## 2018-06-23 MED FILL — ROSUVASTATIN CALCIUM 5 MG T: 5 | 90 days supply | Qty: 90 | Fill #0

## 2018-06-23 MED FILL — tiZANidine HCL 4 MG TABS: 4 | 30 days supply | Qty: 90 | Fill #0

## 2018-07-09 DIAGNOSIS — Z79899 Other long term (current) drug therapy: Secondary | ICD-10-CM | POA: Diagnosis not present

## 2018-07-09 DIAGNOSIS — G8929 Other chronic pain: Secondary | ICD-10-CM | POA: Diagnosis not present

## 2018-07-09 DIAGNOSIS — M2550 Pain in unspecified joint: Secondary | ICD-10-CM | POA: Diagnosis not present

## 2018-07-09 DIAGNOSIS — M533 Sacrococcygeal disorders, not elsewhere classified: Secondary | ICD-10-CM | POA: Diagnosis not present

## 2018-07-09 DIAGNOSIS — Z6825 Body mass index (BMI) 25.0-25.9, adult: Secondary | ICD-10-CM | POA: Diagnosis not present

## 2018-07-09 DIAGNOSIS — M150 Primary generalized (osteo)arthritis: Secondary | ICD-10-CM | POA: Diagnosis not present

## 2018-07-09 DIAGNOSIS — M47899 Other spondylosis, site unspecified: Secondary | ICD-10-CM | POA: Diagnosis not present

## 2018-07-09 DIAGNOSIS — M62838 Other muscle spasm: Secondary | ICD-10-CM | POA: Diagnosis not present

## 2018-07-09 DIAGNOSIS — E663 Overweight: Secondary | ICD-10-CM | POA: Diagnosis not present

## 2018-07-10 ENCOUNTER — Other Ambulatory Visit: Payer: Self-pay | Admitting: Physician Assistant

## 2018-07-10 DIAGNOSIS — M533 Sacrococcygeal disorders, not elsewhere classified: Secondary | ICD-10-CM

## 2018-07-21 MED FILL — HUMIRA 40 MG/0.8ML PSKT: 40 | 28 days supply | Qty: 4 | Fill #5

## 2018-07-21 MED FILL — CELECOXIB 200 MG CAP: 200 | 90 days supply | Qty: 180 | Fill #0

## 2018-07-22 ENCOUNTER — Ambulatory Visit
Admission: RE | Admit: 2018-07-22 | Discharge: 2018-07-22 | Disposition: A | Discharge status: Home / Self Care | Payer: 59 | Source: Ambulatory Visit | Attending: Physician Assistant | Admitting: Physician Assistant

## 2018-07-22 DIAGNOSIS — M533 Sacrococcygeal disorders, not elsewhere classified: Secondary | ICD-10-CM

## 2018-07-22 DIAGNOSIS — M1990 Unspecified osteoarthritis, unspecified site: Secondary | ICD-10-CM | POA: Diagnosis not present

## 2018-07-22 MED ORDER — GADOBENATE DIMEGLUMINE 529 MG/ML IV SOLN
14.0000 mL | Freq: Once | INTRAVENOUS | Status: AC | PRN
  Administered 2018-07-22: 14 mL via INTRAVENOUS

## 2018-07-23 DIAGNOSIS — E7800 Pure hypercholesterolemia, unspecified: Secondary | ICD-10-CM | POA: Diagnosis not present

## 2018-07-23 DIAGNOSIS — Z131 Encounter for screening for diabetes mellitus: Secondary | ICD-10-CM | POA: Diagnosis not present

## 2018-07-23 DIAGNOSIS — I10 Essential (primary) hypertension: Secondary | ICD-10-CM | POA: Diagnosis not present

## 2018-07-23 DIAGNOSIS — F321 Major depressive disorder, single episode, moderate: Secondary | ICD-10-CM | POA: Diagnosis not present

## 2018-07-23 DIAGNOSIS — B009 Herpesviral infection, unspecified: Secondary | ICD-10-CM | POA: Diagnosis not present

## 2018-07-23 DIAGNOSIS — Z0000 Encounter for general adult medical examination without abnormal findings: Secondary | ICD-10-CM | POA: Diagnosis not present

## 2018-07-23 MED FILL — AMLODIPINE 2.5 MG TABLET: 2.5 | 30 days supply | Qty: 30 | Fill #0

## 2018-07-28 DIAGNOSIS — M48061 Spinal stenosis, lumbar region without neurogenic claudication: Secondary | ICD-10-CM | POA: Diagnosis not present

## 2018-08-04 ENCOUNTER — Other Ambulatory Visit: Payer: Self-pay | Admitting: Physical Medicine and Rehabilitation

## 2018-08-04 DIAGNOSIS — M79659 Pain in unspecified thigh: Secondary | ICD-10-CM

## 2018-08-04 DIAGNOSIS — M48061 Spinal stenosis, lumbar region without neurogenic claudication: Secondary | ICD-10-CM

## 2018-08-14 ENCOUNTER — Ambulatory Visit
Admission: RE | Admit: 2018-08-14 | Discharge: 2018-08-14 | Disposition: A | Discharge status: Home / Self Care | Payer: 59 | Source: Ambulatory Visit | Attending: Physical Medicine and Rehabilitation | Admitting: Physical Medicine and Rehabilitation

## 2018-08-14 DIAGNOSIS — M79659 Pain in unspecified thigh: Secondary | ICD-10-CM

## 2018-08-14 DIAGNOSIS — M48061 Spinal stenosis, lumbar region without neurogenic claudication: Secondary | ICD-10-CM

## 2018-08-18 MED FILL — HYDROXYZINE PAM 25 MG CAP: 25 | 90 days supply | Qty: 90 | Fill #0

## 2018-08-19 ENCOUNTER — Encounter: Payer: Self-pay | Admitting: Pharmacist

## 2018-08-19 ENCOUNTER — Ambulatory Visit (INDEPENDENT_AMBULATORY_CARE_PROVIDER_SITE_OTHER): Payer: 59 | Admitting: Pharmacist

## 2018-08-19 DIAGNOSIS — Z79899 Other long term (current) drug therapy: Secondary | ICD-10-CM

## 2018-08-19 MED ORDER — ADALIMUMAB 40 MG/0.8ML ~~LOC~~ PSKT: 0.8000 mL | PREFILLED_SYRINGE | SUBCUTANEOUS | 4 refills | Status: DC

## 2018-08-19 MED FILL — tiZANidine HCL 4 MG TABS: 4 | 30 days supply | Qty: 90 | Fill #0

## 2018-08-19 MED FILL — HUMIRA 40 MG/0.8ML PSKT: 40 | 28 days supply | Qty: 4 | Fill #0

## 2018-08-19 MED FILL — ESTRADIOL 10 MCG TABS: 10 | 84 days supply | Qty: 24 | Fill #3

## 2018-08-19 NOTE — Progress Notes (Signed)
   S: Patient presents to [facility] for review of their specialty medication therapy.  Patient is currently taking Humira for ankylosing spondylitis. Patient is managed by Marella Chimes for this.   Adherence: denies any missed doses  Efficacy: working well for her  Dosing:  Ankylosing spondylitis: SubQ: 40 mg every other week (may continue methotrexate, other nonbiologic DMARDS, corticosteroids, NSAIDs and/or analgesics)   Dose adjustments: Renal: no dose adjustments (has not been studied) Hepatic: no dose adjustments (has not been studied)  Screening: TB test: completed per patient Hepatitis: completed per patient  Monitoring: S/sx of infection: denies CBC: followed by rheum, has been WNL S/sx of hypersensitivity: denies S/sx of malignancy: denies S/sx of heart failure: denies  O:     Lab Results  Component Value Date   WBC 12.8 (H) 04/29/2017   HGB 12.5 04/29/2017   HCT 37.1 04/29/2017   MCV 97.1 04/29/2017   PLT 298 04/29/2017      Chemistry      Component Value Date/Time   NA 135 04/29/2017 0127   K 3.6 04/29/2017 0127   CL 105 04/29/2017 0127   CO2 22 04/29/2017 0127   BUN 16 04/29/2017 0127   CREATININE 0.67 04/29/2017 0127      Component Value Date/Time   CALCIUM 9.1 04/29/2017 0127       A/P: 1. Medication review: Patient currently on Humira for ankylosing spondylitis. Reviewed the medication with the patient, including the following: Humira is a TNF blocking agent indicated for ankylosing spondylitis, Crohn's disease, Hidradenitis suppurativa, psoriatic arthritis, plaque psoriasis, ulcerative colitis, and uveitis. Patient educated on purpose, proper use and potential adverse effects of Humira. The most common adverse effects are infections, headache, and injection site reactions. There is the possibility of an increased risk of malignancy but it is not well understood if this increased risk is due to there medication or the disease state. There  are rare cases of pancytopenia and aplastic anemia. No recommendations for any changes at this time.   Christella Hartigan, PharmD, BCPS, BCACP, CPP Clinical Pharmacist Practitioner  [phone]

## 2018-08-21 DIAGNOSIS — M1611 Unilateral primary osteoarthritis, right hip: Secondary | ICD-10-CM | POA: Diagnosis not present

## 2018-08-22 DIAGNOSIS — M48061 Spinal stenosis, lumbar region without neurogenic claudication: Secondary | ICD-10-CM | POA: Diagnosis not present

## 2018-08-22 DIAGNOSIS — M1611 Unilateral primary osteoarthritis, right hip: Secondary | ICD-10-CM | POA: Diagnosis not present

## 2018-09-11 MED FILL — VENLAFAXINE HCL ER 150 MG C: 150 | 30 days supply | Qty: 30 | Fill #0

## 2018-09-15 MED FILL — ROSUVASTATIN CALCIUM 5 MG T: 5 | 30 days supply | Qty: 30 | Fill #0

## 2018-09-29 MED FILL — HUMIRA 40 MG/0.8ML PSKT: 40 | 28 days supply | Qty: 4 | Fill #1

## 2018-09-29 MED FILL — tiZANidine HCL 4 MG TABS: 4 | 30 days supply | Qty: 90 | Fill #0

## 2018-10-03 MED FILL — HYDROCHLOROTHIAZIDE 12.5 MG: 12.5 | 30 days supply | Qty: 30 | Fill #0

## 2018-10-06 MED FILL — VENLAFAXINE HCL ER 150 MG C: 150 | 30 days supply | Qty: 30 | Fill #1

## 2018-10-08 MED FILL — MINOCYCLINE 50 MG CAPSULE: 50 | 30 days supply | Qty: 60 | Fill #0

## 2018-10-08 MED FILL — SOOLANTRA 1% CREAM: 1 | 30 days supply | Qty: 45 | Fill #0

## 2018-10-13 MED FILL — ROSUVASTATIN CALCIUM 5 MG T: 5 | 30 days supply | Qty: 30 | Fill #1

## 2018-10-13 MED FILL — CELECOXIB 200 MG CAP: 200 | 90 days supply | Qty: 180 | Fill #0

## 2018-10-20 MED FILL — HYDROCHLOROTHIAZIDE 25 MG T: 25 | 30 days supply | Qty: 30 | Fill #0

## 2018-10-30 ENCOUNTER — Other Ambulatory Visit: Payer: Self-pay | Admitting: Orthopedic Surgery

## 2018-10-30 MED FILL — HUMIRA 40 MG/0.8ML PSKT: 40 | 28 days supply | Qty: 4 | Fill #2

## 2018-11-06 NOTE — Pre-Procedure Instructions (Signed)
Adrienne Campbell  11/06/2018     [place], [place] - [address] [place] [address] Phone: [phone] Fax: [phone]    Your procedure is scheduled on Wednesday, March, 11 2020  Report to [facility] Entr[facility] A at 5:30 A.M.  Call this number if you have problems the morning of surgery:  [phone]   Remember:  Do not eat or drink after midnight.   Take these medicines the morning of surgery with A SIP OF WATER  acetaminophen (TYLENOL) 650 MG CR tablet celecoxib (CELEBREX)  If needed: acyclovir (ZOVIRAX)    Follow your surgeon's instructions on when to stop Aspirin.  If no instructions were given by your surgeon then you will need to call the office to get those instructions.    7 days prior to surgery STOP taking any Aspirin (unless otherwise instructed by your surgeon), Aleve, Naproxen, Ibuprofen, Motrin, Advil, Goody's, BC's, all herbal medications, fish oil, and all vitamins.   Do not wear jewelry, make-up or nail polish.  Do not wear lotions, powders, or perfumes, or deodorant.  Do not shave 48 hours prior to surgery.  Men may shave face and neck.  Do not bring valuables to the hospital.  [facility] is not responsible for any belongings or valuables.  Contacts, dentures or bridgework may not be worn into surgery.  Leave your suitcase in the car.  After surgery it may be brought to your room.  For patients admitted to the hospital, discharge time will be determined by your treatment team.  Patients discharged the day of surgery will not be allowed to drive home.   Special instructions:   [facility]- Preparing For Surgery  Before surgery, you can play an important role. Because skin is not sterile, your skin needs to be as free of germs as possible. You can reduce the number of germs on your skin by washing with CHG (chlorahexidine gluconate) Soap before surgery.  CHG is an antiseptic  cleaner which kills germs and bonds with the skin to continue killing germs even after washing.    Oral Hygiene is also important to reduce your risk of infection.  Remember - BRUSH YOUR TEETH THE MORNING OF SURGERY WITH YOUR REGULAR TOOTHPASTE  Please do not use if you have an allergy to CHG or antibacterial soaps. If your skin becomes reddened/irritated stop using the CHG.  Do not shave (including legs and underarms) for at least 48 hours prior to first CHG shower. It is OK to shave your face.  Please follow these instructions carefully.   1. Shower the NIGHT BEFORE SURGERY and the MORNING OF SURGERY with CHG.   2. If you chose to wash your hair, wash your hair first as usual with your normal shampoo.  3. After you shampoo, rinse your hair and body thoroughly to remove the shampoo.  4. Use CHG as you would any other liquid soap. You can apply CHG directly to the skin and wash gently with a scrungie or a clean washcloth.   5. Apply the CHG Soap to your body ONLY FROM THE NECK DOWN.  Do not use on open wounds or open sores. Avoid contact with your eyes, ears, mouth and genitals (private parts). Wash Face and genitals (private parts)  with your normal soap.  6. Wash thoroughly, paying special attention to the area where your surgery will be performed.  7. Thoroughly rinse your body with warm water from the  neck down.  8. DO NOT shower/wash with your normal soap after using and rinsing off the CHG Soap.  9. Pat yourself dry with a CLEAN TOWEL.  10. Wear CLEAN PAJAMAS to bed the night before surgery, wear comfortable clothes the morning of surgery  11. Place CLEAN SHEETS on your bed the night of your first shower and DO NOT SLEEP WITH PETS.  Day of Surgery:  Do not apply any deodorants/lotions.  Please wear clean clothes to the hospital/surgery center.   Remember to brush your teeth WITH YOUR REGULAR TOOTHPASTE.  Please read over the following fact sheets that you were given. Pain  Booklet, Coughing and Deep Breathing, MRSA Information and Surgical Site Infection Prevention

## 2018-11-07 ENCOUNTER — Encounter (HOSPITAL_COMMUNITY)
Admission: RE | Admit: 2018-11-07 | Discharge: 2018-11-07 | Disposition: A | Discharge status: Home / Self Care | Payer: No Typology Code available for payment source | Source: Ambulatory Visit | Attending: Orthopedic Surgery | Admitting: Orthopedic Surgery

## 2018-11-07 ENCOUNTER — Encounter (HOSPITAL_COMMUNITY): Payer: Self-pay

## 2018-11-07 ENCOUNTER — Other Ambulatory Visit: Payer: Self-pay

## 2018-11-07 DIAGNOSIS — Z01818 Encounter for other preprocedural examination: Secondary | ICD-10-CM | POA: Insufficient documentation

## 2018-11-07 DIAGNOSIS — I10 Essential (primary) hypertension: Secondary | ICD-10-CM | POA: Diagnosis not present

## 2018-11-07 HISTORY — DX: Anemia, unspecified: D64.9

## 2018-11-07 LAB — CBC WITH DIFFERENTIAL/PLATELET
Abs Immature Granulocytes: 0.02 10*3/uL (ref 0.00–0.07)
Basophils Absolute: 0 10*3/uL (ref 0.0–0.1)
Basophils Relative: 1 %
EOS ABS: 0.3 10*3/uL (ref 0.0–0.5)
Eosinophils Relative: 3 %
HCT: 41.7 % (ref 36.0–46.0)
Hemoglobin: 13.9 g/dL (ref 12.0–15.0)
Immature Granulocytes: 0 %
Lymphocytes Relative: 55 %
Lymphs Abs: 5 10*3/uL — ABNORMAL HIGH (ref 0.7–4.0)
MCH: 33.4 pg (ref 26.0–34.0)
MCHC: 33.3 g/dL (ref 30.0–36.0)
MCV: 100.2 fL — AB (ref 80.0–100.0)
MONOS PCT: 8 %
Monocytes Absolute: 0.7 10*3/uL (ref 0.1–1.0)
Neutro Abs: 2.9 10*3/uL (ref 1.7–7.7)
Neutrophils Relative %: 33 %
Platelets: 369 10*3/uL (ref 150–400)
RBC: 4.16 MIL/uL (ref 3.87–5.11)
RDW: 11.6 % (ref 11.5–15.5)
WBC: 8.9 10*3/uL (ref 4.0–10.5)
nRBC: 0 % (ref 0.0–0.2)

## 2018-11-07 LAB — PROTIME-INR
INR: 1 (ref 0.8–1.2)
PROTHROMBIN TIME: 12.8 s (ref 11.4–15.2)

## 2018-11-07 LAB — URINALYSIS, ROUTINE W REFLEX MICROSCOPIC
Bilirubin Urine: NEGATIVE
Glucose, UA: NEGATIVE mg/dL
Hgb urine dipstick: NEGATIVE
Ketones, ur: NEGATIVE mg/dL
Leukocytes,Ua: NEGATIVE
Nitrite: NEGATIVE
Protein, ur: NEGATIVE mg/dL
Specific Gravity, Urine: 1.012 (ref 1.005–1.030)
pH: 5 (ref 5.0–8.0)

## 2018-11-07 LAB — TYPE AND SCREEN
ABO/RH(D): A NEG
Antibody Screen: NEGATIVE

## 2018-11-07 LAB — COMPREHENSIVE METABOLIC PANEL
ALT: 15 U/L (ref 0–44)
AST: 21 U/L (ref 15–41)
Albumin: 4.5 g/dL (ref 3.5–5.0)
Alkaline Phosphatase: 70 U/L (ref 38–126)
Anion gap: 9 (ref 5–15)
BILIRUBIN TOTAL: 0.5 mg/dL (ref 0.3–1.2)
BUN: 12 mg/dL (ref 6–20)
CO2: 26 mmol/L (ref 22–32)
Calcium: 9.6 mg/dL (ref 8.9–10.3)
Chloride: 100 mmol/L (ref 98–111)
Creatinine, Ser: 0.69 mg/dL (ref 0.44–1.00)
GFR calc Af Amer: >60 mL/min (ref >60)
Glucose, Bld: 87 mg/dL (ref 70–99)
Potassium: 3.6 mmol/L (ref 3.5–5.1)
Sodium: 135 mmol/L (ref 135–145)
Total Protein: 7 g/dL (ref 6.5–8.1)

## 2018-11-07 LAB — APTT: APTT: 29 s (ref 24–36)

## 2018-11-07 LAB — SURGICAL PCR SCREEN
MRSA, PCR: NEGATIVE
Staphylococcus aureus: NEGATIVE

## 2018-11-07 NOTE — Progress Notes (Signed)
PCP - Dr. Dorthy Cooler [place] Cardiologist - denies but pt. Stated "has upcoming appointment with Dr. Gwenlyn Found end of March"  Chest x-ray - denies EKG - 11/07/2018 Stress Test - denies ECHO - denies Cardiac Cath - denies  Sleep Study - denies  CPAP - N/A  Blood Thinner Instructions: N/A Aspirin Instructions: N/A  Anesthesia review: No  Patient denies shortness of breath, fever, cough and chest pain at PAT appointment  Patient verbalized understanding of instructions that were given to them at the PAT appointment. Patient was also instructed that they will need to review over the PAT instructions again at home before surgery.

## 2018-11-10 ENCOUNTER — Other Ambulatory Visit: Payer: Self-pay | Admitting: *Deleted

## 2018-11-10 ENCOUNTER — Encounter: Payer: Self-pay | Admitting: *Deleted

## 2018-11-10 MED FILL — MINOCYCLINE 50 MG CAPSULE: 50 | 30 days supply | Qty: 60 | Fill #1

## 2018-11-10 MED FILL — HYDROXYZINE PAM 25 MG CAP: 25 | 90 days supply | Qty: 90 | Fill #1

## 2018-11-10 MED FILL — VENLAFAXINE HCL ER 150 MG C: 150 | 30 days supply | Qty: 30 | Fill #2

## 2018-11-10 MED FILL — ROSUVASTATIN CALCIUM 5 MG T: 5 | 30 days supply | Qty: 30 | Fill #2

## 2018-11-10 NOTE — Patient Outreach (Signed)
[facility]) Care Management  11/10/2018  Adrienne Campbell 1961/06/26 [id number]   Preoperative Screening Call Referral received: 10/22/18 Surgery/procedure date: 11/12/18 Insurance: [employer]  Subjective:  Initial successful telephone call to patient's preferred number in order to complete preoperative screening. 2 HIPAA identifiers verified. Discussed purpose of preoperative call. Patient voices understanding and agrees to call. She states she understands the reasons for the surgery and the expected time of arrival. She says she completed her pre-op testing on 11/07/18 and has no additional questions. She says she expects to be in the hospital 2-3 days. She states she has completed medical leave paperwork and does not have the hospital indemnity benefit. She says she will have 24/7 care at home provided by her husband to assist in her recovery. She states she has a health care power of attorney and living will.  He/She agrees to a post hospital discharge transition of care call.    Objective:  Per chart review, Adrienne Campbell is scheduled for RIGHT-SIDED LUMBAR 4-5 TRANSFORAMINAL LUMBAR INTERBODY FUSION WITH INSTRUMENTATION AND ALLOGRAFT on 11/12/18 at [facility]. She completed pre-op testing on 11/07/18. Comorbidities include: Allergic rhinitis,Osteoarthritis of multiple joints,Depression, Hypercholesteremia, Insomnia  Assessment: Preoperative call completed, no preoperative needs identified.   Plan: RNCM will call patient for transition of care outreach within 72 hours of hospital discharge notification.

## 2018-11-11 NOTE — Anesthesia Preprocedure Evaluation (Signed)
Anesthesia Evaluation  Patient identified by MRN, date of birth, ID band Patient awake    Reviewed: Allergy & Precautions, H&P , NPO status , Patient's Chart, lab work & pertinent test results  History of Anesthesia Complications (+) PONVNegative for: history of anesthetic complications  Airway Mallampati: II  TM Distance: >3 FB Neck ROM: Full    Dental  (+) Teeth Intact, Dental Advisory Given   Pulmonary pneumonia,    breath sounds clear to auscultation       Cardiovascular hypertension, Pt. on medications (-) angina Rhythm:Regular Rate:Normal     Neuro/Psych PSYCHIATRIC DISORDERS Depression negative neurological ROS     GI/Hepatic negative GI ROS, Neg liver ROS,   Endo/Other  negative endocrine ROS  Renal/GU negative Renal ROS     Musculoskeletal  (+) Arthritis , Osteoarthritis,    Abdominal   Peds  Hematology  (+) Blood dyscrasia, anemia ,   Anesthesia Other Findings   Reproductive/Obstetrics                             Anesthesia Physical  Anesthesia Plan  ASA: II  Anesthesia Plan: General   Post-op Pain Management:    Induction: Intravenous  PONV Risk Score and Plan: 3 and Ondansetron, Dexamethasone, Treatment may vary due to age or medical condition, Midazolam and Scopolamine patch - Pre-op  Airway Management Planned: Oral ETT  Additional Equipment:   Intra-op Plan:   Post-operative Plan: Extubation in OR  Informed Consent: I have reviewed the patients History and Physical, chart, labs and discussed the procedure including the risks, benefits and alternatives for the proposed anesthesia with the patient or authorized representative who has indicated his/her understanding and acceptance.     Dental advisory given  Plan Discussed with: CRNA, Surgeon and Anesthesiologist  Anesthesia Plan Comments: (Plan routine monitors, GETA)        Anesthesia Quick  Evaluation

## 2018-11-12 ENCOUNTER — Inpatient Hospital Stay (HOSPITAL_COMMUNITY): Payer: No Typology Code available for payment source

## 2018-11-12 ENCOUNTER — Encounter (HOSPITAL_COMMUNITY): Payer: Self-pay | Admitting: *Deleted

## 2018-11-12 ENCOUNTER — Inpatient Hospital Stay (HOSPITAL_COMMUNITY): Payer: No Typology Code available for payment source | Admitting: Anesthesiology

## 2018-11-12 ENCOUNTER — Encounter (HOSPITAL_COMMUNITY)
Admission: RE | Disposition: A | Discharge status: Home / Self Care | Payer: Self-pay | Source: Ambulatory Visit | Attending: Orthopedic Surgery

## 2018-11-12 ENCOUNTER — Other Ambulatory Visit: Payer: Self-pay

## 2018-11-12 ENCOUNTER — Inpatient Hospital Stay (HOSPITAL_COMMUNITY)
Admission: RE | Admit: 2018-11-12 | Discharge: 2018-11-13 | DRG: 455 | Disposition: A | Discharge status: Home / Self Care | Payer: No Typology Code available for payment source | Attending: Orthopedic Surgery | Admitting: Orthopedic Surgery

## 2018-11-12 DIAGNOSIS — Z90710 Acquired absence of both cervix and uterus: Secondary | ICD-10-CM | POA: Diagnosis not present

## 2018-11-12 DIAGNOSIS — Z791 Long term (current) use of non-steroidal anti-inflammatories (NSAID): Secondary | ICD-10-CM | POA: Diagnosis not present

## 2018-11-12 DIAGNOSIS — M48061 Spinal stenosis, lumbar region without neurogenic claudication: Principal | ICD-10-CM | POA: Diagnosis present

## 2018-11-12 DIAGNOSIS — I10 Essential (primary) hypertension: Secondary | ICD-10-CM | POA: Diagnosis present

## 2018-11-12 DIAGNOSIS — F329 Major depressive disorder, single episode, unspecified: Secondary | ICD-10-CM | POA: Diagnosis present

## 2018-11-12 DIAGNOSIS — Z888 Allergy status to other drugs, medicaments and biological substances status: Secondary | ICD-10-CM

## 2018-11-12 DIAGNOSIS — Z886 Allergy status to analgesic agent status: Secondary | ICD-10-CM | POA: Diagnosis not present

## 2018-11-12 DIAGNOSIS — Z419 Encounter for procedure for purposes other than remedying health state, unspecified: Secondary | ICD-10-CM

## 2018-11-12 DIAGNOSIS — J302 Other seasonal allergic rhinitis: Secondary | ICD-10-CM | POA: Diagnosis present

## 2018-11-12 DIAGNOSIS — Z8701 Personal history of pneumonia (recurrent): Secondary | ICD-10-CM | POA: Diagnosis not present

## 2018-11-12 DIAGNOSIS — M5410 Radiculopathy, site unspecified: Secondary | ICD-10-CM | POA: Diagnosis present

## 2018-11-12 DIAGNOSIS — Z96651 Presence of right artificial knee joint: Secondary | ICD-10-CM | POA: Diagnosis present

## 2018-11-12 DIAGNOSIS — M5416 Radiculopathy, lumbar region: Secondary | ICD-10-CM | POA: Diagnosis present

## 2018-11-12 DIAGNOSIS — E785 Hyperlipidemia, unspecified: Secondary | ICD-10-CM | POA: Diagnosis present

## 2018-11-12 DIAGNOSIS — Z882 Allergy status to sulfonamides status: Secondary | ICD-10-CM

## 2018-11-12 DIAGNOSIS — M4316 Spondylolisthesis, lumbar region: Secondary | ICD-10-CM | POA: Diagnosis present

## 2018-11-12 DIAGNOSIS — Z79890 Hormone replacement therapy (postmenopausal): Secondary | ICD-10-CM

## 2018-11-12 DIAGNOSIS — Z885 Allergy status to narcotic agent status: Secondary | ICD-10-CM

## 2018-11-12 DIAGNOSIS — Z79899 Other long term (current) drug therapy: Secondary | ICD-10-CM | POA: Diagnosis not present

## 2018-11-12 DIAGNOSIS — Z96642 Presence of left artificial hip joint: Secondary | ICD-10-CM | POA: Diagnosis present

## 2018-11-12 DIAGNOSIS — G4700 Insomnia, unspecified: Secondary | ICD-10-CM | POA: Diagnosis present

## 2018-11-12 HISTORY — PX: TRANSFORAMINAL LUMBAR INTERBODY FUSION (TLIF) WITH PEDICLE SCREW FIXATION 1 LEVEL: SHX6141

## 2018-11-12 SURGERY — TRANSFORAMINAL LUMBAR INTERBODY FUSION (TLIF) WITH PEDICLE SCREW FIXATION 1 LEVEL
Anesthesia: General | Site: Spine Lumbar | Laterality: Right

## 2018-11-12 MED ORDER — DIAZEPAM 5 MG PO TABS: 5.0000 mg | ORAL_TABLET | Freq: Four times a day (QID) | ORAL | 0 refills | Status: DC | PRN

## 2018-11-12 MED ORDER — LIDOCAINE 2% (20 MG/ML) 5 ML SYRINGE
INTRAMUSCULAR | Status: DC | PRN
  Administered 2018-11-12: 60 mg via INTRAVENOUS

## 2018-11-12 MED ORDER — ROCURONIUM BROMIDE 50 MG/5ML IV SOSY
PREFILLED_SYRINGE | INTRAVENOUS | Status: AC
  Filled 2018-11-12: qty 10

## 2018-11-12 MED ORDER — TIZANIDINE HCL 4 MG PO TABS
4.0000 mg | ORAL_TABLET | Freq: Four times a day (QID) | ORAL | Status: DC | PRN
  Administered 2018-11-12 – 2018-11-13 (×2): 4 mg via ORAL
  Filled 2018-11-12 (×2): qty 1

## 2018-11-12 MED ORDER — ACETAMINOPHEN 160 MG/5ML PO SOLN: 325.0000 mg | ORAL | Status: DC | PRN

## 2018-11-12 MED ORDER — PROPOFOL 10 MG/ML IV BOLUS
INTRAVENOUS | Status: AC
  Filled 2018-11-12: qty 20

## 2018-11-12 MED ORDER — BISACODYL 5 MG PO TBEC: 5.0000 mg | DELAYED_RELEASE_TABLET | Freq: Every day | ORAL | Status: DC | PRN

## 2018-11-12 MED ORDER — DIAZEPAM 5 MG PO TABS
5.0000 mg | ORAL_TABLET | Freq: Four times a day (QID) | ORAL | Status: DC | PRN
  Administered 2018-11-12 (×2): 5 mg via ORAL
  Filled 2018-11-12: qty 1

## 2018-11-12 MED ORDER — FLEET ENEMA 7-19 GM/118ML RE ENEM: 1.0000 | ENEMA | Freq: Once | RECTAL | Status: DC | PRN

## 2018-11-12 MED ORDER — SODIUM CHLORIDE 0.9 % IV SOLN: 250.0000 mL | INTRAVENOUS | Status: DC

## 2018-11-12 MED ORDER — ONDANSETRON HCL 4 MG/2ML IJ SOLN
INTRAMUSCULAR | Status: DC | PRN
  Administered 2018-11-12: 4 mg via INTRAVENOUS

## 2018-11-12 MED ORDER — ACETAMINOPHEN 325 MG PO TABS: 325.0000 mg | ORAL_TABLET | ORAL | Status: DC | PRN

## 2018-11-12 MED ORDER — VENLAFAXINE HCL ER 75 MG PO CP24
150.0000 mg | ORAL_CAPSULE | Freq: Every day | ORAL | Status: DC
  Administered 2018-11-12: 150 mg via ORAL
  Filled 2018-11-12: qty 2

## 2018-11-12 MED ORDER — DOCUSATE SODIUM 100 MG PO CAPS
100.0000 mg | ORAL_CAPSULE | Freq: Two times a day (BID) | ORAL | Status: DC
  Administered 2018-11-12: 100 mg via ORAL
  Filled 2018-11-12: qty 1

## 2018-11-12 MED ORDER — OXYCODONE-ACETAMINOPHEN 5-325 MG PO TABS
1.0000 | ORAL_TABLET | ORAL | Status: DC | PRN
  Administered 2018-11-12 – 2018-11-13 (×5): 2 via ORAL
  Filled 2018-11-12 (×4): qty 2

## 2018-11-12 MED ORDER — METHYLENE BLUE 0.5 % INJ SOLN
INTRAVENOUS | Status: AC
  Filled 2018-11-12: qty 10

## 2018-11-12 MED ORDER — ROSUVASTATIN CALCIUM 5 MG PO TABS
5.0000 mg | ORAL_TABLET | Freq: Every evening | ORAL | Status: DC
  Filled 2018-11-12: qty 1

## 2018-11-12 MED ORDER — ROCURONIUM BROMIDE 10 MG/ML (PF) SYRINGE
PREFILLED_SYRINGE | INTRAVENOUS | Status: DC | PRN
  Administered 2018-11-12 (×2): 20 mg via INTRAVENOUS
  Administered 2018-11-12: 30 mg via INTRAVENOUS

## 2018-11-12 MED ORDER — SCOPOLAMINE 1 MG/3DAYS TD PT72
MEDICATED_PATCH | TRANSDERMAL | Status: AC
  Filled 2018-11-12: qty 1

## 2018-11-12 MED ORDER — FENTANYL CITRATE (PF) 100 MCG/2ML IJ SOLN
25.0000 ug | INTRAMUSCULAR | Status: DC | PRN
  Administered 2018-11-12 (×2): 50 ug via INTRAVENOUS

## 2018-11-12 MED ORDER — PROPOFOL 10 MG/ML IV BOLUS
INTRAVENOUS | Status: DC | PRN
  Administered 2018-11-12: 150 mg via INTRAVENOUS

## 2018-11-12 MED ORDER — SUGAMMADEX SODIUM 200 MG/2ML IV SOLN
INTRAVENOUS | Status: DC | PRN
  Administered 2018-11-12: 200 mg via INTRAVENOUS

## 2018-11-12 MED ORDER — DEXMEDETOMIDINE HCL 200 MCG/2ML IV SOLN
INTRAVENOUS | Status: DC | PRN
  Administered 2018-11-12 (×2): 8 ug via INTRAVENOUS

## 2018-11-12 MED ORDER — PROPOFOL 1000 MG/100ML IV EMUL
INTRAVENOUS | Status: AC
  Filled 2018-11-12: qty 200

## 2018-11-12 MED ORDER — ADALIMUMAB 40 MG/0.8ML ~~LOC~~ PSKT: 0.8000 mL | PREFILLED_SYRINGE | SUBCUTANEOUS | Status: DC

## 2018-11-12 MED ORDER — MENTHOL 3 MG MT LOZG: 1.0000 | LOZENGE | OROMUCOSAL | Status: DC | PRN

## 2018-11-12 MED ORDER — PHENYLEPHRINE 40 MCG/ML (10ML) SYRINGE FOR IV PUSH (FOR BLOOD PRESSURE SUPPORT)
PREFILLED_SYRINGE | INTRAVENOUS | Status: DC | PRN
  Administered 2018-11-12: 60 ug via INTRAVENOUS
  Administered 2018-11-12 (×3): 40 ug via INTRAVENOUS

## 2018-11-12 MED ORDER — MORPHINE SULFATE (PF) 2 MG/ML IV SOLN: 2.0000 mg | INTRAVENOUS | Status: DC | PRN

## 2018-11-12 MED ORDER — ONDANSETRON HCL 4 MG/2ML IJ SOLN: 4.0000 mg | Freq: Four times a day (QID) | INTRAMUSCULAR | Status: DC | PRN

## 2018-11-12 MED ORDER — FENTANYL CITRATE (PF) 250 MCG/5ML IJ SOLN
INTRAMUSCULAR | Status: DC | PRN
  Administered 2018-11-12 (×2): 50 ug via INTRAVENOUS
  Administered 2018-11-12: 100 ug via INTRAVENOUS
  Administered 2018-11-12: 50 ug via INTRAVENOUS

## 2018-11-12 MED ORDER — SUCCINYLCHOLINE CHLORIDE 200 MG/10ML IV SOSY
PREFILLED_SYRINGE | INTRAVENOUS | Status: AC
  Filled 2018-11-12: qty 10

## 2018-11-12 MED ORDER — ESTRADIOL 10 MCG VA TABS: 10.0000 ug | ORAL_TABLET | VAGINAL | Status: DC

## 2018-11-12 MED ORDER — LACTATED RINGERS IV SOLN
INTRAVENOUS | Status: DC | PRN
  Administered 2018-11-12 (×2): via INTRAVENOUS

## 2018-11-12 MED ORDER — KETAMINE HCL 50 MG/5ML IJ SOSY
PREFILLED_SYRINGE | INTRAMUSCULAR | Status: AC
  Filled 2018-11-12: qty 5

## 2018-11-12 MED ORDER — BUPIVACAINE-EPINEPHRINE 0.25% -1:200000 IJ SOLN
INTRAMUSCULAR | Status: DC | PRN
  Administered 2018-11-12: 20 mL
  Administered 2018-11-12: 10 mL

## 2018-11-12 MED ORDER — VITAMIN D3 25 MCG (1000 UNIT) PO TABS
1000.0000 [IU] | ORAL_TABLET | Freq: Every day | ORAL | Status: DC
  Administered 2018-11-12: 1000 [IU] via ORAL
  Filled 2018-11-12 (×2): qty 1

## 2018-11-12 MED ORDER — FENTANYL CITRATE (PF) 100 MCG/2ML IJ SOLN
INTRAMUSCULAR | Status: AC
  Filled 2018-11-12: qty 2

## 2018-11-12 MED ORDER — VITAMIN B-12 1000 MCG PO TABS
5000.0000 ug | ORAL_TABLET | Freq: Every day | ORAL | Status: DC
  Filled 2018-11-12 (×2): qty 5

## 2018-11-12 MED ORDER — DIAZEPAM 5 MG PO TABS
ORAL_TABLET | ORAL | Status: AC
  Filled 2018-11-12: qty 1

## 2018-11-12 MED ORDER — BUPIVACAINE LIPOSOME 1.3 % IJ SUSP
20.0000 mL | INTRAMUSCULAR | Status: AC
  Administered 2018-11-12: 20 mL
  Filled 2018-11-12: qty 20

## 2018-11-12 MED ORDER — SCOPOLAMINE 1 MG/3DAYS TD PT72
MEDICATED_PATCH | TRANSDERMAL | Status: DC | PRN
  Administered 2018-11-12: 1 via TRANSDERMAL

## 2018-11-12 MED ORDER — KETAMINE HCL 10 MG/ML IJ SOLN
INTRAMUSCULAR | Status: DC | PRN
  Administered 2018-11-12: 20 mg via INTRAVENOUS

## 2018-11-12 MED ORDER — CEFAZOLIN SODIUM-DEXTROSE 2-4 GM/100ML-% IV SOLN
INTRAVENOUS | Status: AC
  Filled 2018-11-12: qty 100

## 2018-11-12 MED ORDER — EPHEDRINE SULFATE-NACL 50-0.9 MG/10ML-% IV SOSY
PREFILLED_SYRINGE | INTRAVENOUS | Status: DC | PRN
  Administered 2018-11-12 (×5): 5 mg via INTRAVENOUS

## 2018-11-12 MED ORDER — METHYLENE BLUE 0.5 % INJ SOLN
INTRAVENOUS | Status: DC | PRN
  Administered 2018-11-12: 1 mL

## 2018-11-12 MED ORDER — THROMBIN 20000 UNITS EX SOLR
CUTANEOUS | Status: DC | PRN
  Administered 2018-11-12: 20000 [IU] via TOPICAL

## 2018-11-12 MED ORDER — THROMBIN (RECOMBINANT) 20000 UNITS EX SOLR
CUTANEOUS | Status: AC
  Filled 2018-11-12: qty 20000

## 2018-11-12 MED ORDER — ACYCLOVIR 200 MG PO CAPS
400.0000 mg | ORAL_CAPSULE | Freq: Three times a day (TID) | ORAL | Status: DC | PRN
  Filled 2018-11-12: qty 2

## 2018-11-12 MED ORDER — BUPIVACAINE-EPINEPHRINE (PF) 0.25% -1:200000 IJ SOLN
INTRAMUSCULAR | Status: AC
  Filled 2018-11-12: qty 30

## 2018-11-12 MED ORDER — HYDROXYZINE HCL 25 MG PO TABS
25.0000 mg | ORAL_TABLET | Freq: Every day | ORAL | Status: DC
  Administered 2018-11-12: 25 mg via ORAL
  Filled 2018-11-12: qty 1

## 2018-11-12 MED ORDER — ACETAMINOPHEN 325 MG PO TABS: 650.0000 mg | ORAL_TABLET | ORAL | Status: DC | PRN

## 2018-11-12 MED ORDER — SENNOSIDES-DOCUSATE SODIUM 8.6-50 MG PO TABS: 1.0000 | ORAL_TABLET | Freq: Every evening | ORAL | Status: DC | PRN

## 2018-11-12 MED ORDER — ONDANSETRON HCL 4 MG/2ML IJ SOLN
INTRAMUSCULAR | Status: AC
  Filled 2018-11-12: qty 2

## 2018-11-12 MED ORDER — SODIUM CHLORIDE 0.9% FLUSH: 3.0000 mL | INTRAVENOUS | Status: DC | PRN

## 2018-11-12 MED ORDER — OXYCODONE-ACETAMINOPHEN 5-325 MG PO TABS
ORAL_TABLET | ORAL | Status: AC
  Filled 2018-11-12: qty 2

## 2018-11-12 MED ORDER — SODIUM CHLORIDE 0.9% FLUSH: 3.0000 mL | Freq: Two times a day (BID) | INTRAVENOUS | Status: DC

## 2018-11-12 MED ORDER — ACETAMINOPHEN 650 MG RE SUPP: 650.0000 mg | RECTAL | Status: DC | PRN

## 2018-11-12 MED ORDER — ALUM & MAG HYDROXIDE-SIMETH 200-200-20 MG/5ML PO SUSP: 30.0000 mL | Freq: Four times a day (QID) | ORAL | Status: DC | PRN

## 2018-11-12 MED ORDER — ZOLPIDEM TARTRATE 5 MG PO TABS: 5.0000 mg | ORAL_TABLET | Freq: Every evening | ORAL | Status: DC | PRN

## 2018-11-12 MED ORDER — ONDANSETRON HCL 4 MG PO TABS: 4.0000 mg | ORAL_TABLET | Freq: Three times a day (TID) | ORAL | Status: DC | PRN

## 2018-11-12 MED ORDER — ONDANSETRON HCL 4 MG/2ML IJ SOLN: 4.0000 mg | Freq: Once | INTRAMUSCULAR | Status: DC | PRN

## 2018-11-12 MED ORDER — CEFAZOLIN SODIUM-DEXTROSE 2-4 GM/100ML-% IV SOLN
2.0000 g | INTRAVENOUS | Status: AC
  Administered 2018-11-12: 2 g via INTRAVENOUS

## 2018-11-12 MED ORDER — LIDOCAINE 2% (20 MG/ML) 5 ML SYRINGE
INTRAMUSCULAR | Status: AC
  Filled 2018-11-12: qty 5

## 2018-11-12 MED ORDER — POTASSIUM CHLORIDE IN NACL 20-0.9 MEQ/L-% IV SOLN: INTRAVENOUS | Status: DC

## 2018-11-12 MED ORDER — MEPERIDINE HCL 50 MG/ML IJ SOLN: 6.2500 mg | INTRAMUSCULAR | Status: DC | PRN

## 2018-11-12 MED ORDER — OXYCODONE HCL 5 MG PO TABS: 5.0000 mg | ORAL_TABLET | Freq: Once | ORAL | Status: DC | PRN

## 2018-11-12 MED ORDER — PHENYLEPHRINE 40 MCG/ML (10ML) SYRINGE FOR IV PUSH (FOR BLOOD PRESSURE SUPPORT)
PREFILLED_SYRINGE | INTRAVENOUS | Status: AC
  Filled 2018-11-12: qty 10

## 2018-11-12 MED ORDER — OXYCODONE HCL 5 MG/5ML PO SOLN: 5.0000 mg | Freq: Once | ORAL | Status: DC | PRN

## 2018-11-12 MED ORDER — DEXMEDETOMIDINE HCL IN NACL 200 MCG/50ML IV SOLN
INTRAVENOUS | Status: AC
  Filled 2018-11-12: qty 50

## 2018-11-12 MED ORDER — PHENOL 1.4 % MT LIQD: 1.0000 | OROMUCOSAL | Status: DC | PRN

## 2018-11-12 MED ORDER — ALBUMIN HUMAN 5 % IV SOLN
INTRAVENOUS | Status: DC | PRN
  Administered 2018-11-12: 11:00:00 via INTRAVENOUS

## 2018-11-12 MED ORDER — HYDROXYZINE PAMOATE 25 MG PO CAPS: 25.0000 mg | ORAL_CAPSULE | Freq: Every day | ORAL | Status: DC

## 2018-11-12 MED ORDER — EPHEDRINE 5 MG/ML INJ
INTRAVENOUS | Status: AC
  Filled 2018-11-12: qty 10

## 2018-11-12 MED ORDER — SUCCINYLCHOLINE CHLORIDE 200 MG/10ML IV SOSY
PREFILLED_SYRINGE | INTRAVENOUS | Status: DC | PRN
  Administered 2018-11-12: 140 mg via INTRAVENOUS

## 2018-11-12 MED ORDER — PROPOFOL 500 MG/50ML IV EMUL
INTRAVENOUS | Status: DC | PRN
  Administered 2018-11-12: 100 ug/kg/min via INTRAVENOUS

## 2018-11-12 MED ORDER — MINOCYCLINE HCL 50 MG PO CAPS
50.0000 mg | ORAL_CAPSULE | Freq: Two times a day (BID) | ORAL | Status: DC
  Administered 2018-11-12 (×2): 50 mg via ORAL
  Filled 2018-11-12 (×3): qty 1

## 2018-11-12 MED ORDER — ONDANSETRON HCL 4 MG PO TABS: 4.0000 mg | ORAL_TABLET | Freq: Four times a day (QID) | ORAL | Status: DC | PRN

## 2018-11-12 MED ORDER — MIDAZOLAM HCL 2 MG/2ML IJ SOLN
INTRAMUSCULAR | Status: DC | PRN
  Administered 2018-11-12: 2 mg via INTRAVENOUS

## 2018-11-12 MED ORDER — OXYCODONE-ACETAMINOPHEN 5-325 MG PO TABS: 1.0000 | ORAL_TABLET | ORAL | 0 refills | Status: DC | PRN

## 2018-11-12 MED ORDER — FENTANYL CITRATE (PF) 250 MCG/5ML IJ SOLN
INTRAMUSCULAR | Status: AC
  Filled 2018-11-12: qty 5

## 2018-11-12 MED ORDER — HEMOSTATIC AGENTS (NO CHARGE) OPTIME
TOPICAL | Status: DC | PRN
  Administered 2018-11-12: 1

## 2018-11-12 MED ORDER — IVERMECTIN 1 % EX CREA: 1.0000 "application " | TOPICAL_CREAM | Freq: Every day | CUTANEOUS | Status: DC

## 2018-11-12 MED ORDER — 0.9 % SODIUM CHLORIDE (POUR BTL) OPTIME
TOPICAL | Status: DC | PRN
  Administered 2018-11-12 (×3): 1000 mL

## 2018-11-12 MED ORDER — POVIDONE-IODINE 7.5 % EX SOLN
Freq: Once | CUTANEOUS | Status: DC
  Filled 2018-11-12: qty 118

## 2018-11-12 MED ORDER — VITAMIN E 45 MG (100 UNIT) PO CAPS
1000.0000 [IU] | ORAL_CAPSULE | Freq: Every day | ORAL | Status: DC
  Administered 2018-11-12: 1000 [IU] via ORAL
  Filled 2018-11-12: qty 2

## 2018-11-12 MED ORDER — CEFAZOLIN SODIUM-DEXTROSE 2-4 GM/100ML-% IV SOLN
2.0000 g | Freq: Three times a day (TID) | INTRAVENOUS | Status: AC
  Administered 2018-11-12 (×2): 2 g via INTRAVENOUS
  Filled 2018-11-12 (×2): qty 100

## 2018-11-12 MED ORDER — MIDAZOLAM HCL 2 MG/2ML IJ SOLN
INTRAMUSCULAR | Status: AC
  Filled 2018-11-12: qty 2

## 2018-11-12 MED ORDER — HYDROCHLOROTHIAZIDE 25 MG PO TABS
25.0000 mg | ORAL_TABLET | Freq: Every day | ORAL | Status: DC
  Administered 2018-11-12: 25 mg via ORAL
  Filled 2018-11-12: qty 1

## 2018-11-12 MED FILL — OXYCODONE-ACETAMINOPHEN 5-3: 5-325 | 4 days supply | Qty: 40 | Fill #0

## 2018-11-12 SURGICAL SUPPLY — 91 items
APL SKNCLS STERI-STRIP NONHPOA (GAUZE/BANDAGES/DRESSINGS) ×1
BENZOIN TINCTURE PRP APPL 2/3 (GAUZE/BANDAGES/DRESSINGS) ×3 IMPLANT
BLADE CLIPPER SURG (BLADE) ×2 IMPLANT
BONE VIVIGEN FORMABLE 10CC (Bone Implant) ×3 IMPLANT
BUR PRESCISION 1.7 ELITE (BURR) ×3 IMPLANT
BUR ROUND FLUTED 5 RND (BURR) ×2 IMPLANT
BUR ROUND FLUTED 5MM RND (BURR) ×1
BUR SABER RD CUTTING 3.0 (BURR) IMPLANT
BUR SABER RD CUTTING 3.0MM (BURR)
CAGE CONCORDE BULLET 9X10X23 (Cage) ×3 IMPLANT
CAGE SPNL 5D BLT NOSE 23X9X10 (Cage) IMPLANT
CARTRIDGE OIL MAESTRO DRILL (MISCELLANEOUS) ×1 IMPLANT
CLOSURE WOUND 1/2 X4 (GAUZE/BANDAGES/DRESSINGS) ×1
CONT SPEC 4OZ CLIKSEAL STRL BL (MISCELLANEOUS) ×3 IMPLANT
COVER BACK TABLE 60X90IN (DRAPES) ×3 IMPLANT
COVER MAYO STAND STRL (DRAPES) ×4 IMPLANT
COVER SURGICAL LIGHT HANDLE (MISCELLANEOUS) ×3 IMPLANT
COVER WAND RF STERILE (DRAPES) ×3 IMPLANT
DECANTER SPIKE VIAL GLASS SM (MISCELLANEOUS) ×2 IMPLANT
DIFFUSER DRILL AIR PNEUMATIC (MISCELLANEOUS) ×3 IMPLANT
DRAIN CHANNEL 15F RND FF W/TCR (WOUND CARE) IMPLANT
DRAPE C-ARM 42X72 X-RAY (DRAPES) ×3 IMPLANT
DRAPE C-ARMOR (DRAPES) IMPLANT
DRAPE POUCH INSTRU U-SHP 10X18 (DRAPES) ×3 IMPLANT
DRAPE SURG 17X23 STRL (DRAPES) ×12 IMPLANT
DURAPREP 26ML APPLICATOR (WOUND CARE) ×3 IMPLANT
ELECT BLADE 4.0 EZ CLEAN MEGAD (MISCELLANEOUS) ×3
ELECT CAUTERY BLADE 6.4 (BLADE) ×3 IMPLANT
ELECT REM PT RETURN 9FT ADLT (ELECTROSURGICAL) ×3
ELECTRODE BLDE 4.0 EZ CLN MEGD (MISCELLANEOUS) ×1 IMPLANT
ELECTRODE REM PT RTRN 9FT ADLT (ELECTROSURGICAL) ×1 IMPLANT
EVACUATOR SILICONE 100CC (DRAIN) IMPLANT
FEE INTRAOP MONITOR IMPULS NCS (MISCELLANEOUS) IMPLANT
FILTER STRAW FLUID ASPIR (MISCELLANEOUS) ×3 IMPLANT
GAUZE 4X4 16PLY RFD (DISPOSABLE) ×3 IMPLANT
GAUZE SPONGE 4X4 12PLY STRL (GAUZE/BANDAGES/DRESSINGS) ×3 IMPLANT
GLOVE BIO SURGEON STRL SZ7 (GLOVE) ×3 IMPLANT
GLOVE BIO SURGEON STRL SZ8 (GLOVE) ×3 IMPLANT
GLOVE BIOGEL PI IND STRL 7.0 (GLOVE) ×1 IMPLANT
GLOVE BIOGEL PI IND STRL 8 (GLOVE) ×1 IMPLANT
GLOVE BIOGEL PI INDICATOR 7.0 (GLOVE) ×2
GLOVE BIOGEL PI INDICATOR 8 (GLOVE) ×2
GLOVE SURG SS PI 7.0 STRL IVOR (GLOVE) ×4 IMPLANT
GOWN STRL REUS W/ TWL LRG LVL3 (GOWN DISPOSABLE) ×2 IMPLANT
GOWN STRL REUS W/ TWL XL LVL3 (GOWN DISPOSABLE) ×1 IMPLANT
GOWN STRL REUS W/TWL LRG LVL3 (GOWN DISPOSABLE) ×6
GOWN STRL REUS W/TWL XL LVL3 (GOWN DISPOSABLE) ×3
GRAFT BNE MATRIX VG FRMBL L 10 (Bone Implant) IMPLANT
INTRAOP MONITOR FEE IMPULS NCS (MISCELLANEOUS) ×1
INTRAOP MONITOR FEE IMPULSE (MISCELLANEOUS) ×2
IV CATH 14GX2 1/4 (CATHETERS) ×3 IMPLANT
KIT BASIN OR (CUSTOM PROCEDURE TRAY) ×3 IMPLANT
KIT POSITION SURG JACKSON T1 (MISCELLANEOUS) ×3 IMPLANT
KIT TURNOVER KIT B (KITS) ×3 IMPLANT
MARKER SKIN DUAL TIP RULER LAB (MISCELLANEOUS) ×6 IMPLANT
NDL HYPO 25GX1X1/2 BEV (NEEDLE) ×1 IMPLANT
NDL SAFETY ECLIPSE 18X1.5 (NEEDLE) ×1 IMPLANT
NDL SPNL 18GX3.5 QUINCKE PK (NEEDLE) ×2 IMPLANT
NEEDLE 22X1 1/2 (OR ONLY) (NEEDLE) ×6 IMPLANT
NEEDLE HYPO 18GX1.5 SHARP (NEEDLE) ×3
NEEDLE HYPO 25GX1X1/2 BEV (NEEDLE) ×3 IMPLANT
NEEDLE SPNL 18GX3.5 QUINCKE PK (NEEDLE) ×6 IMPLANT
NS IRRIG 1000ML POUR BTL (IV SOLUTION) ×7 IMPLANT
OIL CARTRIDGE MAESTRO DRILL (MISCELLANEOUS) ×3
PACK LAMINECTOMY ORTHO (CUSTOM PROCEDURE TRAY) ×3 IMPLANT
PACK UNIVERSAL I (CUSTOM PROCEDURE TRAY) ×3 IMPLANT
PAD ARMBOARD 7.5X6 YLW CONV (MISCELLANEOUS) ×6 IMPLANT
PATTIES SURGICAL .5 X1 (DISPOSABLE) ×3 IMPLANT
PATTIES SURGICAL .5X1.5 (GAUZE/BANDAGES/DRESSINGS) ×3 IMPLANT
PROBE PEDCLE PROBE MAGSTM DISP (MISCELLANEOUS) ×2 IMPLANT
ROD PRE BENT EXPEDIUM 35MM (Rod) ×4 IMPLANT
SCREW SET SINGLE INNER (Screw) ×8 IMPLANT
SCREW VIPER CORT FIX 6.00X30 (Screw) ×4 IMPLANT
SCREW VIPER CORT FIX 6X35 (Screw) ×4 IMPLANT
SPONGE INTESTINAL PEANUT (DISPOSABLE) ×3 IMPLANT
SPONGE SURGIFOAM ABS GEL 100 (HEMOSTASIS) ×3 IMPLANT
STRIP CLOSURE SKIN 1/2X4 (GAUZE/BANDAGES/DRESSINGS) ×3 IMPLANT
SUT MNCRL AB 4-0 PS2 18 (SUTURE) ×3 IMPLANT
SUT VIC AB 0 CT1 18XCR BRD 8 (SUTURE) ×1 IMPLANT
SUT VIC AB 0 CT1 8-18 (SUTURE) ×3
SUT VIC AB 1 CT1 18XCR BRD 8 (SUTURE) ×1 IMPLANT
SUT VIC AB 1 CT1 8-18 (SUTURE) ×3
SUT VIC AB 2-0 CT2 18 VCP726D (SUTURE) ×3 IMPLANT
SYR 20CC LL (SYRINGE) ×6 IMPLANT
SYR BULB IRRIGATION 50ML (SYRINGE) ×3 IMPLANT
SYR CONTROL 10ML LL (SYRINGE) ×6 IMPLANT
SYR TB 1ML LUER SLIP (SYRINGE) ×3 IMPLANT
TAPE CLOTH SURG 4X10 WHT LF (GAUZE/BANDAGES/DRESSINGS) ×2 IMPLANT
TRAY FOLEY MTR SLVR 16FR STAT (SET/KITS/TRAYS/PACK) ×3 IMPLANT
WATER STERILE IRR 1000ML POUR (IV SOLUTION) ×3 IMPLANT
YANKAUER SUCT BULB TIP NO VENT (SUCTIONS) ×3 IMPLANT

## 2018-11-12 NOTE — H&P (Signed)
PREOPERATIVE H&P  Chief Complaint: Right leg pain  HPI: Adrienne Campbell is a 58 y.o. female who presents with ongoing pain in the right leg  MRI reveals severe stenosis with a slip at L4/5  Patient has failed multiple forms of conservative care and continues to have pain (see office notes for additional details regarding the patient's full course of treatment)      Past Medical History:  Diagnosis Date  . Anemia   . Arthritis   . Depression   . Environmental and seasonal allergies   . Hyperlipidemia   . Hypertension   . Insomnia   . Pneumonia    hx of  . PONV (postoperative nausea and vomiting)    Nausea, per pt. "felt loopy after hip surgery"        Past Surgical History:  Procedure Laterality Date  . ABDOMINAL HYSTERECTOMY    . COLONOSCOPY    . TOTAL HIP ARTHROPLASTY Left 06/28/2014   Procedure: LEFT TOTAL HIP ARTHROPLASTY;  Surgeon: Kerin Salen, MD;  Location: [place];  Service: Orthopedics;  Laterality: Left;  . TOTAL KNEE ARTHROPLASTY Right 08/11/2014   Procedure: RIGHT TOTAL KNEE ARTHROPLASTY;  Surgeon: Kerin Salen, MD;  Location: [facility];  Service: Orthopedics;  Laterality: Right;   Social History        Socioeconomic History  . Marital status: Married    Spouse name: Not on file  . Number of children: Not on file  . Years of education: Not on file  . Highest education level: Not on file  Occupational History  . Not on file  Social Needs  . Financial resource strain: Not on file  . Food insecurity:    Worry: Not on file    Inability: Not on file  . Transportation needs:    Medical: Not on file    Non-medical: Not on file  Tobacco Use  . Smoking status: Never Smoker  . Smokeless tobacco: Never Used  Substance and Sexual Activity  . Alcohol use: No    Comment: RARE  . Drug use: No  . Sexual activity: Not on file  Lifestyle  . Physical activity:    Days per week: Not on file    Minutes per session:  Not on file  . Stress: Not on file  Relationships  . Social connections:    Talks on phone: Not on file    Gets together: Not on file    Attends religious service: Not on file    Active member of club or organization: Not on file    Attends meetings of clubs or organizations: Not on file    Relationship status: Not on file  Other Topics Concern  . Not on file  Social History Narrative  . Not on file   History reviewed. No pertinent family history.      Allergies  Allergen Reactions  . Nsaids Hives    Specifically meloxicam and ibuprofen reported  . Onion Swelling    SWELLING REACTION UNSPECIFIED   . Sulfa Antibiotics Swelling    Patient reports tongue swells  . Fluticasone Other (See Comments)    ulcers  . Oxycodone Nausea Only    Severe nausea; pt has to take with Zofran  . Tramadol Hives  . Codeine Nausea And Vomiting  . Gluten Meal Diarrhea          Prior to Admission medications   Medication Sig Start Date End Date Taking? Authorizing Provider  acetaminophen (TYLENOL) 650 MG CR  tablet Take 1,300 mg by mouth 2 (two) times daily.   Yes [provider]  acyclovir (ZOVIRAX) 200 MG capsule Take 400 mg by mouth 3 (three) times daily as needed (cold sores).   Yes [provider]  Adalimumab (HUMIRA) 40 MG/0.8ML PSKT Inject 0.8 mLs (40 mg total) into the skin once a week. 0.8 ml once weekly subcutaneous Patient taking differently: Inject 0.8 mLs into the skin every Tuesday. 0.8 ml once weekly subcutaneous 08/19/18  Yes Jegede, Olugbemiga E, MD  celecoxib (CELEBREX) 200 MG capsule Take 200 mg by mouth 2 (two) times daily.   Yes [provider]  cholecalciferol (VITAMIN D) 25 MCG (1000 UT) tablet Take 1,000 Units by mouth daily.   Yes [provider]  CITRUS BERGAMOT PO Take 1 tablet by mouth at bedtime.   Yes [provider]  Coenzyme Q10 (COQ10) 100 MG CAPS Take 100 mg by mouth every evening.    Yes [provider]  Cyanocobalamin (VITAMIN B-12) 5000 MCG TBDP Take 5,000 mcg by mouth daily.   Yes [provider]  Estradiol (VAGIFEM) 10 MCG TABS vaginal tablet Place 10 mcg vaginally 3 (three) times a week.   Yes [provider]  hydrochlorothiazide (HYDRODIURIL) 25 MG tablet Take 25 mg by mouth daily. 10/20/18  Yes [provider]  hydrOXYzine (VISTARIL) 25 MG capsule Take 25 mg by mouth at bedtime.  08/22/12  Yes Hensel, Jamal Collin, MD  minocycline (MINOCIN,DYNACIN) 50 MG capsule Take 50 mg by mouth 2 (two) times daily. 10/08/18  Yes [provider]  Omega-3 Fatty Acids (FISH OIL) 1000 MG CAPS Take 1,000 mg by mouth every evening.   Yes [provider]  Polyethyl Glycol-Propyl Glycol (LUBRICANT EYE DROPS) 0.4-0.3 % SOLN Place 1 drop into both eyes 4 (four) times daily as needed (dry/irritated eyes).   Yes [provider]  rosuvastatin (CRESTOR) 5 MG tablet Take 5 mg by mouth every evening.   Yes [provider]  SOOLANTRA 1 % CREA Apply 1 application topically at bedtime. 10/08/18  Yes [provider]  tiZANidine (ZANAFLEX) 4 MG tablet Take 4 mg by mouth at bedtime.   Yes [provider]  venlafaxine XR (EFFEXOR XR) 150 MG 24 hr capsule Take 150 mg by mouth at bedtime.  08/22/12  Yes Hensel, Jamal Collin, MD  vitamin E 1000 UNIT capsule Take 1,000 Units by mouth at bedtime. 450 mg   Yes [provider]     All other systems have been reviewed and were otherwise negative with the exception of those mentioned in the HPI and as above.  Physical Exam:    Vitals:   11/12/18 0653  BP: (!) 155/83  Pulse: 74  Resp: 20  Temp: 98.7 F (37.1 C)  SpO2: 100%    Body mass index is 24.65 kg/m.  General: Alert, no acute distress Cardiovascular: No pedal edema Respiratory: No cyanosis, no use of accessory musculature Skin: No lesions in the area of chief  complaint Neurologic: Sensation intact distally Psychiatric: Patient is competent for consent with normal mood and affect Lymphatic: No axillary or cervical lymphadenopathy  MUSCULOSKELETAL: + SLR on the right  Assessment/Plan: Grade 2 spondylolisthesis and severe spinal stenosis at Lumbar 4 - Lumbar 5 Plan for Procedure(s): RIGHT-SIDED LUMBAR 4-5 TRANSFORAMINAL LUMBAR INTERBODY FUSION WITH INSTRUMENTATION AND ALLOGRAFT   Norva Karvonen, MD 11/12/2018 7:24 AM

## 2018-11-12 NOTE — Transfer of Care (Signed)
Immediate Anesthesia Transfer of Care Note  Patient: Adrienne Campbell  Procedure(s) Performed: RIGHT-SIDED LUMBAR 4-5 TRANSFORAMINAL LUMBAR INTERBODY FUSION WITH INSTRUMENTATION AND ALLOGRAFT (Right Spine Lumbar)  Patient Location: PACU  Anesthesia Type:General  Level of Consciousness: awake, alert  and oriented  Airway & Oxygen Therapy: Patient Spontanous Breathing and Patient connected to face mask oxygen  Post-op Assessment: Report given to RN and Post -op Vital signs reviewed and stable  Post vital signs: Reviewed and stable  Last Vitals:  Vitals Value Taken Time  BP 128/76 11/12/2018 12:10 PM  Temp    Pulse 65 11/12/2018 12:14 PM  Resp 19 11/12/2018 12:14 PM  SpO2 100 % 11/12/2018 12:14 PM  Vitals shown include unvalidated device data.  Last Pain:  Vitals:   11/12/18 0653  TempSrc: Oral  PainSc:       Patients Stated Pain Goal: 4 (35/67/01 4103)  Complications: No apparent anesthesia complications

## 2018-11-12 NOTE — Anesthesia Procedure Notes (Signed)
Procedure Name: Intubation Date/Time: 11/12/2018 9:02 AM Performed by: Teressa Lower., CRNA Pre-anesthesia Checklist: Patient identified, Emergency Drugs available, Suction available and Patient being monitored Patient Re-evaluated:Patient Re-evaluated prior to induction Oxygen Delivery Method: Circle system utilized Preoxygenation: Pre-oxygenation with 100% oxygen Induction Type: IV induction Ventilation: Mask ventilation without difficulty Laryngoscope Size: Mac and 3 Grade View: Grade I Tube type: Oral Tube size: 7.0 mm Number of attempts: 1 Airway Equipment and Method: Stylet and Oral airway Placement Confirmation: ETT inserted through vocal cords under direct vision,  positive ETCO2 and breath sounds checked- equal and bilateral Secured at: 22 cm Tube secured with: Tape Dental Injury: Teeth and Oropharynx as per pre-operative assessment

## 2018-11-12 NOTE — Op Note (Signed)
PREOPERATIVE H&P  Chief Complaint: Right leg pain  HPI: Adrienne Campbell is a 58 y.o. female who presents with ongoing pain in the right leg  MRI reveals severe stenosis with a slip at L4/5  Patient has failed multiple forms of conservative care and continues to have pain (see office notes for additional details regarding the patient's full course of treatment)  Past Medical History:  Diagnosis Date  . Anemia   . Arthritis   . Depression   . Environmental and seasonal allergies   . Hyperlipidemia   . Hypertension   . Insomnia   . Pneumonia    hx of  . PONV (postoperative nausea and vomiting)    Nausea, per pt. "felt loopy after hip surgery"   Past Surgical History:  Procedure Laterality Date  . ABDOMINAL HYSTERECTOMY    . COLONOSCOPY    . TOTAL HIP ARTHROPLASTY Left 06/28/2014   Procedure: LEFT TOTAL HIP ARTHROPLASTY;  Surgeon: Kerin Salen, MD;  Location: [facility];  Service: Orthopedics;  Laterality: Left;  . TOTAL KNEE ARTHROPLASTY Right 08/11/2014   Procedure: RIGHT TOTAL KNEE ARTHROPLASTY;  Surgeon: Kerin Salen, MD;  Location: [place];  Service: Orthopedics;  Laterality: Right;   Social History   Socioeconomic History  . Marital status: Married    Spouse name: Not on file  . Number of children: Not on file  . Years of education: Not on file  . Highest education level: Not on file  Occupational History  . Not on file  Social Needs  . Financial resource strain: Not on file  . Food insecurity:    Worry: Not on file    Inability: Not on file  . Transportation needs:    Medical: Not on file    Non-medical: Not on file  Tobacco Use  . Smoking status: Never Smoker  . Smokeless tobacco: Never Used  Substance and Sexual Activity  . Alcohol use: No    Comment: RARE  . Drug use: No  . Sexual activity: Not on file  Lifestyle  . Physical activity:    Days per week: Not on file    Minutes per session: Not on file  . Stress: Not on file  Relationships    . Social connections:    Talks on phone: Not on file    Gets together: Not on file    Attends religious service: Not on file    Active member of club or organization: Not on file    Attends meetings of clubs or organizations: Not on file    Relationship status: Not on file  Other Topics Concern  . Not on file  Social History Narrative  . Not on file   History reviewed. No pertinent family history. Allergies  Allergen Reactions  . Nsaids Hives    Specifically meloxicam and ibuprofen reported  . Onion Swelling    SWELLING REACTION UNSPECIFIED   . Sulfa Antibiotics Swelling    Patient reports tongue swells  . Fluticasone Other (See Comments)    ulcers  . Oxycodone Nausea Only    Severe nausea; pt has to take with Zofran  . Tramadol Hives  . Codeine Nausea And Vomiting  . Gluten Meal Diarrhea   Prior to Admission medications   Medication Sig Start Date End Date Taking? Authorizing Provider  acetaminophen (TYLENOL) 650 MG CR tablet Take 1,300 mg by mouth 2 (two) times daily.   Yes [provider]  acyclovir (ZOVIRAX) 200 MG capsule  Take 400 mg by mouth 3 (three) times daily as needed (cold sores).   Yes [provider]  Adalimumab (HUMIRA) 40 MG/0.8ML PSKT Inject 0.8 mLs (40 mg total) into the skin once a week. 0.8 ml once weekly subcutaneous Patient taking differently: Inject 0.8 mLs into the skin every Tuesday. 0.8 ml once weekly subcutaneous 08/19/18  Yes Jegede, Olugbemiga E, MD  celecoxib (CELEBREX) 200 MG capsule Take 200 mg by mouth 2 (two) times daily.   Yes [provider]  cholecalciferol (VITAMIN D) 25 MCG (1000 UT) tablet Take 1,000 Units by mouth daily.   Yes [provider]  CITRUS BERGAMOT PO Take 1 tablet by mouth at bedtime.   Yes [provider]  Coenzyme Q10 (COQ10) 100 MG CAPS Take 100 mg by mouth every evening.   Yes [provider]  Cyanocobalamin (VITAMIN B-12) 5000 MCG TBDP Take 5,000 mcg by mouth daily.    Yes [provider]  Estradiol (VAGIFEM) 10 MCG TABS vaginal tablet Place 10 mcg vaginally 3 (three) times a week.   Yes [provider]  hydrochlorothiazide (HYDRODIURIL) 25 MG tablet Take 25 mg by mouth daily. 10/20/18  Yes [provider]  hydrOXYzine (VISTARIL) 25 MG capsule Take 25 mg by mouth at bedtime.  08/22/12  Yes Hensel, Jamal Collin, MD  minocycline (MINOCIN,DYNACIN) 50 MG capsule Take 50 mg by mouth 2 (two) times daily. 10/08/18  Yes [provider]  Omega-3 Fatty Acids (FISH OIL) 1000 MG CAPS Take 1,000 mg by mouth every evening.   Yes [provider]  Polyethyl Glycol-Propyl Glycol (LUBRICANT EYE DROPS) 0.4-0.3 % SOLN Place 1 drop into both eyes 4 (four) times daily as needed (dry/irritated eyes).   Yes [provider]  rosuvastatin (CRESTOR) 5 MG tablet Take 5 mg by mouth every evening.   Yes [provider]  SOOLANTRA 1 % CREA Apply 1 application topically at bedtime. 10/08/18  Yes [provider]  tiZANidine (ZANAFLEX) 4 MG tablet Take 4 mg by mouth at bedtime.   Yes [provider]  venlafaxine XR (EFFEXOR XR) 150 MG 24 hr capsule Take 150 mg by mouth at bedtime.  08/22/12  Yes Hensel, Jamal Collin, MD  vitamin E 1000 UNIT capsule Take 1,000 Units by mouth at bedtime. 450 mg   Yes [provider]     All other systems have been reviewed and were otherwise negative with the exception of those mentioned in the HPI and as above.  Physical Exam: Vitals:   11/12/18 0653  BP: (!) 155/83  Pulse: 74  Resp: 20  Temp: 98.7 F (37.1 C)  SpO2: 100%    Body mass index is 24.65 kg/m.  General: Alert, no acute distress Cardiovascular: No pedal edema Respiratory: No cyanosis, no use of accessory musculature Skin: No lesions in the area of chief complaint Neurologic: Sensation intact distally Psychiatric: Patient is competent for consent with normal mood and affect Lymphatic: No axillary or  cervical lymphadenopathy  MUSCULOSKELETAL: + SLR on the right  Assessment/Plan: Grade 2 spondylolisthesis and severe spinal stenosis at Lumbar 4 - Lumbar 5 Plan for Procedure(s): RIGHT-SIDED LUMBAR 4-5 TRANSFORAMINAL LUMBAR INTERBODY FUSION WITH INSTRUMENTATION AND ALLOGRAFT   Norva Karvonen, MD 11/12/2018 7:24 AM

## 2018-11-12 NOTE — Op Note (Signed)
PATIENT NAME: Adrienne Campbell   MEDICAL RECORD NO.:   [id number]    PHYSICIAN:  Phylliss Bob, MD      DATE OF BIRTH: [birth year]   DATE OF PROCEDURE: 11/12/2018                               OPERATIVE REPORT     PREOPERATIVE DIAGNOSES: 1. Right lumbar radiculopathy. 2. L4-5 spinal stenosis. 3. Grade 2 L4/5 spondyloisthesis   POSTOPERATIVE DIAGNOSES: 1. Right lumbar radiculopathy. 2. L4-5 spinal stenosis. 3. Grade 2 L4/5 spondyloisthesis   PROCEDURES: 1. L4/5 decompression 2. Right-sided L4-5 transforaminal lumbar interbody fusion. 3. Left-sided L4-5 posterolateral fusion. 4. Insertion of interbody device x1 (80mm lordotic Concorde intervertebral spacer). 5. Placement of segmental posterior instrumentation L4, L5 bilaterally  6. Use of local autograft. 7. Use of morselized allograft - Vivigen 8. Intraoperative use of fluoroscopy.   SURGEON:  Phylliss Bob, MD.   ASSISTANTPricilla Holm, PA-C.   ANESTHESIA:  General endotracheal anesthesia.   COMPLICATIONS:  None.   DISPOSITION:  Stable.   ESTIMATED BLOOD LOSS:  100cc   INDICATIONS FOR SURGERY:  Briefly,  Adrienne Campbell is a pleasant 58 year old female who did present to me with severe and ongoing pain in the right leg. I did feel that the symptoms were secondary to the findings noted above.   The patient failed conservative care and did wish to proceed with the procedure  noted above.   OPERATIVE DETAILS:  On 11/12/2018, the patient was brought to surgery and general endotracheal anesthesia was administered.  The patient was placed prone on a well-padded flat Jackson bed with a spinal frame.  Antibiotics were given and a time-out procedure was performed. The back was prepped and draped in the usual fashion.  A midline incision was made overlying the L4-5 intervertebral spaces.  The fascia was incised at the midline.  The paraspinal musculature was bluntly swept laterally.  Anatomic landmarks for the  pedicles were exposed. Using fluoroscopy, I did cannulate the L4 and L5 pedicles bilaterally, using a medial to lateral cortical trajectory technique. At this point, 6 mm screws were placed into the left pedicles, and a 35 mm rod was placed into the tulip heads of the screw, and caps were also placed.  Distraction was then applied across the L4-5 intervertebral space, and the caps were then provisionally tightened.  On the right side, bone wax was placed into the cannulated pedicle holes.  I then proceeded with the decompressive aspect of the procedure at the L4-5 level.  A partial facetectomy was performed bilaterally at L4-5, decompressing the L4-5 intervertebral space.  I was very pleased with the decompression. With the assistant holding medial retraction of the traversing left L5 nerve, I did perform an annulotomy at the posterolateral aspect of the L4-5 intervertebral space.  I then used a series of curettes and pituitary rongeurs to perform a thorough and complete intervertebral diskectomy.  The intervertebral space was then liberally packed with autograft as well as allograft in the form of Vivigen, as was the appropriate-sized intervertebral spacer (10 mm, lordotic).  The spacer was then tamped into position in the usual fashion.  I was very pleased with the press-fit of the spacer.  I then placed 6 mm screws on the right at L4 and L5. A 35-mm rod was then placed and caps were placed. The distraction was then released on the contralateral side.  All caps were  then locked.  The wound was copiously irrigated with a total of approximately 3 L prior to placing the bone graft.  Additional autograft and allograft was then packed into the posterolateral gutter on the left side to help aid in the success of the fusion.  The wound was  explored for any undue bleeding and there was no substantial bleeding encountered.  Gel-Foam was placed over the laminectomy site.  The wound was then closed in layers  using #1 Vicryl followed by 2-0 Vicryl, followed by 4-0 Monocryl.  Benzoin and Steri-Strips were applied followed by sterile dressing.    Of note, there was no sustained abnormal EMG activity noted throughout the surgery.   Of note, Pricilla Holm was my assistant throughout surgery, and did aid in retraction, suctioning, and closure.     Phylliss Bob, MD

## 2018-11-13 MED ORDER — HYDROXYZINE HCL 50 MG/ML IM SOLN
50.0000 mg | Freq: Once | INTRAMUSCULAR | Status: AC
  Administered 2018-11-13: 50 mg via INTRAMUSCULAR
  Filled 2018-11-13: qty 1

## 2018-11-13 MED ORDER — ONDANSETRON HCL 4 MG PO TABS: 4.0000 mg | ORAL_TABLET | Freq: Four times a day (QID) | ORAL | 0 refills | Status: DC | PRN

## 2018-11-13 MED FILL — ONDANSETRON HCL 4 MG TABLET: 4 | 5 days supply | Qty: 20 | Fill #0

## 2018-11-13 NOTE — Evaluation (Signed)
Occupational Therapy Evaluation and Discharge Patient Details Name: Adrienne Campbell MRN: [id number] DOB: [birth year] Today's Date: 11/13/2018    History of Present Illness Pt is a 58 y/o female who presents s/p L4-L5 TLIF on 11/12/2018. PMH significant for HTN, depression, R TKR, L THR.   Clinical Impression   Pt experiencing nausea, but able to verbalize understanding of back precautions and compensatory strategies for ADL and IADL. Mobility requires supervision to min guard assist without a device. All DME and AE needs are met. No further OT.    Follow Up Recommendations  No OT follow up    Equipment Recommendations  None recommended by OT    Recommendations for Other Services       Precautions / Restrictions Precautions Precautions: Fall;Back Precaution Booklet Issued: Yes (comment) Precaution Comments: reviewed back precautions related to ADL and IADL Required Braces or Orthoses: Spinal Brace Spinal Brace: Thoracolumbosacral orthotic;Applied in sitting position Restrictions Weight Bearing Restrictions: No      Mobility Bed Mobility Overal bed mobility: Needs Assistance Bed Mobility: Rolling;Sidelying to Sit;Sit to Sidelying Rolling: Supervision Sidelying to sit: Supervision     Sit to sidelying: Supervision General bed mobility comments: VC's for log roll technique. HOB flat and rails lowered to simulate home environment.   Transfers Overall transfer level: Needs assistance Equipment used: None Transfers: Sit to/from Stand Sit to Stand: Min guard         General transfer comment: Close guard for safety as pt powered up to full stand. Pt demonstrated proper hand placement on seated surface for safety.     Balance Overall balance assessment: Needs assistance Sitting-balance support: Feet supported Sitting balance-Leahy Scale: Fair     Standing balance support: No upper extremity supported Standing balance-Leahy Scale: Fair                              ADL either performed or assessed with clinical judgement   ADL                                         General ADL Comments: Educated pt in two cup method for toothbrushing, use of washcloth for washing face at sink, to avoid twisting with pericare, safe footwear and to defer heavy housework to family     Vision Patient Visual Report: No change from baseline       Perception     Praxis      Pertinent Vitals/Pain Pain Assessment: Faces Faces Pain Scale: Hurts even more Pain Location: B thighs; incision site Pain Descriptors / Indicators: Aching Pain Intervention(s): Monitored during session;Repositioned;Ice applied;Premedicated before session     Hand Dominance Right   Extremity/Trunk Assessment Upper Extremity Assessment Upper Extremity Assessment: Overall WFL for tasks assessed   Lower Extremity Assessment Lower Extremity Assessment: Defer to PT evaluation   Cervical / Trunk Assessment Cervical / Trunk Assessment: Other exceptions Cervical / Trunk Exceptions: s/p surgery   Communication Communication Communication: No difficulties   Cognition Arousal/Alertness: Awake/alert;Lethargic;Suspect due to medications Behavior During Therapy: Flat affect Overall Cognitive Status: Within Functional Limits for tasks assessed                                 General Comments: pt sleepy from nausea meds   General Comments  Exercises     Shoulder Instructions      Home Living Family/patient expects to be discharged to:: Private residence Living Arrangements: Spouse/significant other Available Help at Discharge: Family;Available 24 hours/day Type of Home: House Home Access: Stairs to enter [employer] of Steps: 3 Entrance Stairs-Rails: Right Home Layout: Two level Alternate Level Stairs-Number of Steps: 5 Alternate Level Stairs-Rails: Right Bathroom Shower/Tub: Tub/shower unit   Bathroom Toilet:  Handicapped height     Home Equipment: Environmental consultant - 2 wheels;Cane - single point;Bedside commode;Tub bench;Adaptive equipment Adaptive Equipment: Reacher;Sock aid;Long-handled shoe horn;Long-handled sponge(Hip kit)        Prior Functioning/Environment Level of Independence: Independent                 OT Problem List:        OT Treatment/Interventions:      OT Goals(Current goals can be found in the care plan section) Acute Rehab OT Goals Patient Stated Goal: Home today  OT Frequency:     Barriers to D/C:            Co-evaluation              AM-PAC OT "6 Clicks" Daily Activity     Outcome Measure Help from another person eating meals?: None Help from another person taking care of personal grooming?: A Little Help from another person toileting, which includes using toliet, bedpan, or urinal?: A Little Help from another person bathing (including washing, rinsing, drying)?: A Little Help from another person to put on and taking off regular upper body clothing?: None Help from another person to put on and taking off regular lower body clothing?: A Little 6 Click Score: 20   End of Session Equipment Utilized During Treatment: Gait belt;Back brace  Activity Tolerance: Treatment limited secondary to medical complications (Comment)(nausea) Patient left: in bed;with call bell/phone within reach  OT Visit Diagnosis: Pain                Time: 0947-1000 OT Time Calculation (min): 13 min Charges:  OT General Charges $OT Visit: 1 Visit OT Evaluation $OT Eval Low Complexity: 1 Low  Nestor Lewandowsky, OTR/L Acute Rehabilitation Services Pager: [phone] Office: [phone]  Adrienne Campbell 11/13/2018, 1:05 PM

## 2018-11-13 NOTE — Progress Notes (Signed)
    Patient doing well  Patient denies leg pain + expected LBP   Physical Exam: Vitals:   11/13/18 0320 11/13/18 0734  BP: (!) 89/57 122/77  Pulse: 67 75  Resp: 18 16  Temp: 97.8 F (36.6 C) 98 F (36.7 C)  SpO2: 97% 98%    Dressing in place NVI  POD #1 s/p L4/5 decompression and fusion, doing well  - up with PT/OT, encourage ambulation - Percocet for pain, Valium for muscle spasms - likely d/c home today with f/u in 2 weeks

## 2018-11-13 NOTE — Progress Notes (Signed)
Patient is discharged from room 3C02 at this time. Alert and in stable condition. IV site d/c'd and instructions read to patient and spouse with understanding verbalized. Left unit via wheelchair with all belongings at side 

## 2018-11-13 NOTE — Evaluation (Signed)
Physical Therapy Evaluation Patient Details Name: Adrienne Campbell MRN: [id number] DOB: Oct 06, 1960 Today's Date: 11/13/2018   History of Present Illness  Pt is a 58 y/o female who presents s/p L4-L5 TLIF on 11/12/2018. PMH significant for HTN, depression, R TKR, L THR.  Clinical Impression  Pt admitted with above diagnosis. Pt currently with functional limitations due to the deficits listed below (see PT Problem List). At the time of PT eval pt was able to perform transfers and ambulation with gross min guard assist to supervision for safety and no AD. Pt was educated on precautions, brace application/wearing schedule, activity progression, positioning recommendations, and car transfer. Pt will benefit from skilled PT to increase their independence and safety with mobility to allow discharge to the venue listed below.       Follow Up Recommendations No PT follow up;Supervision for mobility/OOB    Equipment Recommendations  None recommended by PT    Recommendations for Other Services       Precautions / Restrictions Precautions Precautions: Fall;Back Precaution Booklet Issued: Yes (comment) Precaution Comments: Reviewed handout in detail with pt. She was cued for precautions during functional mobility.  Required Braces or Orthoses: Spinal Brace Spinal Brace: Thoracolumbosacral orthotic;Applied in sitting position Restrictions Weight Bearing Restrictions: No      Mobility  Bed Mobility Overal bed mobility: Needs Assistance Bed Mobility: Rolling;Sidelying to Sit Rolling: Supervision Sidelying to sit: Supervision       General bed mobility comments: VC's for log roll technique. HOB flat and rails lowered to simulate home environment.   Transfers Overall transfer level: Needs assistance Equipment used: None Transfers: Sit to/from Stand Sit to Stand: Min guard         General transfer comment: Close guard for safety as pt powered up to full stand. Pt demonstrated  proper hand placement on seated surface for safety.   Ambulation/Gait Ambulation/Gait assistance: Min guard Gait Distance (Feet): 350 Feet Assistive device: None Gait Pattern/deviations: Step-through pattern;Decreased stride length;Trunk flexed Gait velocity: Decreased Gait velocity interpretation: 1.31 - 2.62 ft/sec, indicative of limited community ambulator General Gait Details: VC's for improved posture and general safety. Slow and guarded but without overt LOB.   Stairs Stairs: Yes Stairs assistance: Min guard Stair Management: One rail Left;Step to pattern;Forwards Number of Stairs: 5 General stair comments: VC's for sequencing and safety with stair negotiation.   Wheelchair Mobility    Modified Rankin (Stroke Patients Only)       Balance Overall balance assessment: Needs assistance Sitting-balance support: Feet supported Sitting balance-Leahy Scale: Fair     Standing balance support: No upper extremity supported Standing balance-Leahy Scale: Fair                               Pertinent Vitals/Pain Pain Assessment: Faces Faces Pain Scale: Hurts even more Pain Location: B thighs; incision site Pain Descriptors / Indicators: Aching    Home Living Family/patient expects to be discharged to:: Private residence Living Arrangements: Spouse/significant other Available Help at Discharge: Family;Available 24 hours/day Type of Home: House Home Access: Stairs to enter Entrance Stairs-Rails: Right Entrance Stairs-Number of Steps: 3 Home Layout: Two level Home Equipment: Walker - 2 wheels;Cane - single point;Bedside commode;Tub bench;Adaptive equipment      Prior Function Level of Independence: Independent               Hand Dominance        Extremity/Trunk Assessment   Upper Extremity Assessment  Upper Extremity Assessment: Defer to OT evaluation    Lower Extremity Assessment Lower Extremity Assessment: Generalized weakness(Consistent with  pre-op diagnosis)    Cervical / Trunk Assessment Cervical / Trunk Assessment: Other exceptions Cervical / Trunk Exceptions: s/p surgery  Communication   Communication: No difficulties  Cognition Arousal/Alertness: Awake/alert Behavior During Therapy: WFL for tasks assessed/performed Overall Cognitive Status: Within Functional Limits for tasks assessed                                        General Comments      Exercises     Assessment/Plan    PT Assessment Patient needs continued PT services  PT Problem List Decreased strength;Decreased activity tolerance;Decreased balance;Decreased mobility;Decreased knowledge of use of DME;Decreased safety awareness;Decreased knowledge of precautions;Pain       PT Treatment Interventions DME instruction;Gait training;Stair training;Functional mobility training;Therapeutic activities;Therapeutic exercise;Neuromuscular re-education;Patient/family education    PT Goals (Current goals can be found in the Care Plan section)  Acute Rehab PT Goals Patient Stated Goal: Home today PT Goal Formulation: With patient Time For Goal Achievement: 11/20/18 Potential to Achieve Goals: Good    Frequency Min 5X/week   Barriers to discharge        Co-evaluation               AM-PAC PT "6 Clicks" Mobility  Outcome Measure Help needed turning from your back to your side while in a flat bed without using bedrails?: None Help needed moving from lying on your back to sitting on the side of a flat bed without using bedrails?: None Help needed moving to and from a bed to a chair (including a wheelchair)?: A Little Help needed standing up from a chair using your arms (e.g., wheelchair or bedside chair)?: A Little Help needed to walk in hospital room?: A Little Help needed climbing 3-5 steps with a railing? : A Little 6 Click Score: 20    End of Session Equipment Utilized During Treatment: Gait belt;Back brace Activity Tolerance:  No increased pain Patient left: in chair;with call bell/phone within reach Nurse Communication: Mobility status PT Visit Diagnosis: Unsteadiness on feet (R26.81);Pain Pain - part of body: (back and bilateral thighs)    Time: 0981-1914 PT Time Calculation (min) (ACUTE ONLY): 21 min   Charges:   PT Evaluation $PT Eval Moderate Complexity: 1 Mod PT Treatments $Gait Training: 8-22 mins        Rolinda Roan, PT, DPT Acute Rehabilitation Services Pager: [phone] Office: [phone]   Thelma Comp 11/13/2018, 11:58 AM

## 2018-11-14 ENCOUNTER — Encounter (HOSPITAL_COMMUNITY): Payer: Self-pay | Admitting: Orthopedic Surgery

## 2018-11-14 MED FILL — Heparin Sodium (Porcine) Inj 1000 Unit/ML: INTRAMUSCULAR | Qty: 30 | Status: AC

## 2018-11-14 MED FILL — Sodium Chloride IV Soln 0.9%: INTRAVENOUS | Qty: 1000 | Status: AC

## 2018-11-14 MED FILL — Thrombin (Recombinant) For Soln 20000 Unit: CUTANEOUS | Qty: 1 | Status: AC

## 2018-11-14 NOTE — Anesthesia Postprocedure Evaluation (Signed)
Anesthesia Post Note  Patient: ANADIA HELMES  Procedure(s) Performed: RIGHT-SIDED LUMBAR 4-5 TRANSFORAMINAL LUMBAR INTERBODY FUSION WITH INSTRUMENTATION AND ALLOGRAFT (Right Spine Lumbar)     Patient location during evaluation: PACU Anesthesia Type: General Level of consciousness: awake and alert Pain management: pain level controlled Vital Signs Assessment: post-procedure vital signs reviewed and stable Respiratory status: spontaneous breathing, nonlabored ventilation, respiratory function stable and patient connected to nasal cannula oxygen Cardiovascular status: blood pressure returned to baseline and stable Postop Assessment: no apparent nausea or vomiting Anesthetic complications: no    Last Vitals:  Vitals:   11/13/18 0320 11/13/18 0734  BP: (!) 89/57 122/77  Pulse: 67 75  Resp: 18 16  Temp: 36.6 C 36.7 C  SpO2: 97% 98%    Last Pain:  Vitals:   11/13/18 1010  TempSrc:   PainSc: 4                  Santia Labate

## 2018-11-17 ENCOUNTER — Other Ambulatory Visit: Payer: Self-pay | Admitting: *Deleted

## 2018-11-17 NOTE — Patient Outreach (Signed)
[facility]) Care Management  11/17/2018  Adrienne Campbell November 09, 1960 [id number]   Transition of care call   Referral received: 10/22/18 Initial outreach: 11/17/18 Insurance: [place] [employer]   Subjective: Initial successful telephone call to patient's preferred number in order to complete transition of care assessment; 2 HIPAA identifiers verified. Explained purpose of call and completed transition of care assessment.  Adrienne Campbell states she is doing well, pain level currently at level 2 on 10 point scale, says she cannot take the oxycodone because it is causing her to have hallucinations. She is taking extra strength Tylenol and Celebrex to treat her surgical pain. States she is ambulating without difficulty wearing her back brace. Says her husband will examine her back  incision today.    Objective:  Adrienne Campbell had RIGHT-SIDED LUMBAR 4-5 TRANSFORAMINAL LUMBAR INTERBODY FUSION WITH INSTRUMENTATION AND ALLOGRAFT on 11/12/18 at [facility]  Comorbidities include: Allergic rhinitis,Osteoarthritis of multiple joints,Depression, Hypercholesteremia, Insomnia She was discharged to home on 11/13/18 without the need for home health services or DME other than a back brace.   Assessment:  Patient voices good understanding of all discharge instructions.  See transition of care flowsheet for assessment details.   Plan:  Added hydrocodone to Adrienne Campbell's allergy list with the adverse effects of hallucinations.  No ongoing care management needs identified so will close case to [place] [facility] care management services and route successful outreach letter with [place] Management pamphlet and 24 Hour Nurse Line Magnet to [place] Management clinical pool to be mailed to patient's home address.   Barrington Ellison RN,CCM,CDE [place] Management Coordinator Office Phone [phone] Office  Fax [phone]

## 2018-11-20 NOTE — Discharge Summary (Signed)
Patient ID: Adrienne Campbell MRN: [id number] DOB/AGE: July 08, 1961 58 y.o.  Admit date: 11/12/2018 Discharge date: 11/13/2018  Admission Diagnoses:  Active Problems:   Radiculopathy   Discharge Diagnoses:  Same  Past Medical History:  Diagnosis Date  . Anemia   . Arthritis   . Depression   . Environmental and seasonal allergies   . Hyperlipidemia   . Hypertension   . Insomnia   . Pneumonia    hx of  . PONV (postoperative nausea and vomiting)    Nausea, per pt. "felt loopy after hip surgery"    Surgeries: Procedure(s): RIGHT-SIDED LUMBAR 4-5 TRANSFORAMINAL LUMBAR INTERBODY FUSION WITH INSTRUMENTATION AND ALLOGRAFT on 11/12/2018   Consultants: None  Discharged Condition: Improved  Hospital Course: Adrienne Campbell is an 58 y.o. female who was admitted 11/12/2018 for operative treatment of radiculopathy. Patient has severe unremitting pain that affects sleep, daily activities, and work/hobbies. After pre-op clearance the patient was taken to the operating room on 11/12/2018 and underwent  Procedure(s): RIGHT-SIDED LUMBAR 4-5 TRANSFORAMINAL LUMBAR INTERBODY FUSION WITH INSTRUMENTATION AND ALLOGRAFT.    Patient was given perioperative antibiotics:  Anti-infectives (From admission, onward)   Start     Dose/Rate Route Frequency Ordered Stop   11/12/18 1345  minocycline (MINOCIN,DYNACIN) capsule 50 mg  Status:  Discontinued     50 mg Oral 2 times daily 11/12/18 1333 11/13/18 1610   11/12/18 1333  acyclovir (ZOVIRAX) 200 MG capsule 400 mg  Status:  Discontinued     400 mg Oral 3 times daily PRN 11/12/18 1333 11/13/18 1610   11/12/18 1330  ceFAZolin (ANCEF) IVPB 2g/100 mL premix     2 g 200 mL/hr over 30 Minutes Intravenous Every 8 hours 11/12/18 1320 11/13/18 0750   11/12/18 0645  ceFAZolin (ANCEF) IVPB 2g/100 mL premix     2 g 200 mL/hr over 30 Minutes Intravenous On call to O.R. 11/12/18 0932 11/12/18 0845   11/12/18 0641  ceFAZolin (ANCEF) 2-4 GM/100ML-% IVPB     Note to Pharmacy:  Jasmine Pang   : cabinet override      11/12/18 0641 11/12/18 1844       Patient was given sequential compression devices, early ambulation to prevent DVT.  Patient benefited maximally from hospital stay and there were no complications.    Recent vital signs: BP 122/77 (BP Location: Left Arm)   Pulse 75   Temp 98 F (36.7 C) (Oral)   Resp 16   Ht 5\' 4"  (1.626 m)   Wt 65.1 kg   SpO2 98%   BMI 24.65 kg/m    Discharge Medications:   Allergies as of 11/13/2018      Reactions   Nsaids Hives   Specifically meloxicam and ibuprofen reported   Onion Swelling   SWELLING REACTION UNSPECIFIED    Sulfa Antibiotics Swelling   Patient reports tongue swells   Fluticasone Other (See Comments)   ulcers   Oxycodone Nausea Only   Severe nausea; pt has to take with Zofran   Tramadol Hives   Codeine Nausea And Vomiting   Gluten Meal Diarrhea      Medication List    STOP taking these medications   acetaminophen 650 MG CR tablet Commonly known as:  TYLENOL     TAKE these medications   acyclovir 200 MG capsule Commonly known as:  ZOVIRAX Take 400 mg by mouth 3 (three) times daily as needed (cold sores).   Adalimumab 40 MG/0.8ML Pskt Commonly known as:  Humira Inject 0.8  mLs (40 mg total) into the skin once a week. 0.8 ml once weekly subcutaneous What changed:  when to take this   cholecalciferol 25 MCG (1000 UT) tablet Commonly known as:  VITAMIN D Take 1,000 Units by mouth daily.   CITRUS BERGAMOT PO Take 1 tablet by mouth at bedtime.   CoQ10 100 MG Caps Take 100 mg by mouth every evening.   Crestor 5 MG tablet Generic drug:  rosuvastatin Take 5 mg by mouth every evening.   diazepam 5 MG tablet Commonly known as:  VALIUM Take 1 tablet (5 mg total) by mouth every 6 (six) hours as needed for muscle spasms.   Effexor XR 150 MG 24 hr capsule Generic drug:  venlafaxine XR Take 150 mg by mouth at bedtime.   hydrochlorothiazide 25 MG tablet  Commonly known as:  HYDRODIURIL Take 25 mg by mouth daily.   hydrOXYzine 25 MG capsule Commonly known as:  VISTARIL Take 25 mg by mouth at bedtime.   Lubricant Eye Drops 0.4-0.3 % Soln Generic drug:  Polyethyl Glycol-Propyl Glycol Place 1 drop into both eyes 4 (four) times daily as needed (dry/irritated eyes).   minocycline 50 MG capsule Commonly known as:  MINOCIN,DYNACIN Take 50 mg by mouth 2 (two) times daily.   ondansetron 4 MG tablet Commonly known as:  ZOFRAN Take 1 tablet (4 mg total) by mouth every 6 (six) hours as needed for nausea or vomiting.   oxyCODONE-acetaminophen 5-325 MG tablet Commonly known as:  PERCOCET/ROXICET Take 1-2 tablets by mouth every 4 (four) hours as needed for moderate pain or severe pain.   Soolantra 1 % Crea Generic drug:  Ivermectin Apply 1 application topically at bedtime.   Vagifem 10 MCG Tabs vaginal tablet Generic drug:  Estradiol Place 10 mcg vaginally 3 (three) times a week.   Vitamin B-12 5000 MCG Tbdp Take 5,000 mcg by mouth daily.   vitamin E 1000 UNIT capsule Take 1,000 Units by mouth at bedtime. 450 mg       Diagnostic Studies: Dg Lumbar Spine 2-3 Views  Result Date: 11/12/2018 CLINICAL DATA:  L4-5 fusion EXAM: DG C-ARM 61-120 MIN; LUMBAR SPINE - 2-3 VIEW COMPARISON:  Intraop film from earlier in the same day. FLUOROSCOPY TIME:  Radiation Exposure Index (as provided by the fluoroscopic device): Not available If the device does not provide the exposure index: Fluoroscopy Time:  36 seconds Number of Acquired Images:  2 FINDINGS: Pedicle screws are noted at L4 and L5 with interbody fusion. Residual anterolisthesis is noted. IMPRESSION: L4-5 fusion. Electronically Signed   By: Inez Catalina M.D.   On: 11/12/2018 13:54   Dg Lumbar Spine 1 View  Result Date: 11/12/2018 CLINICAL DATA:  Intraoperative localization EXAM: LUMBAR SPINE - 1 VIEW COMPARISON:  08/14/2018 FINDINGS: Needles are noted in the posterior soft tissues just  superior to the L3-4 interspace and just below the L4-5 interspace. The numbering nomenclature is similar to that utilized on prior MRI. Persistent anterolisthesis of L4 on L5 is noted. IMPRESSION: Intraoperative localization as described. Electronically Signed   By: Inez Catalina M.D.   On: 11/12/2018 11:47   Dg C-arm 1-60 Min  Result Date: 11/12/2018 CLINICAL DATA:  L4-5 fusion EXAM: DG C-ARM 61-120 MIN; LUMBAR SPINE - 2-3 VIEW COMPARISON:  Intraop film from earlier in the same day. FLUOROSCOPY TIME:  Radiation Exposure Index (as provided by the fluoroscopic device): Not available If the device does not provide the exposure index: Fluoroscopy Time:  36 seconds Number of Acquired  Images:  2 FINDINGS: Pedicle screws are noted at L4 and L5 with interbody fusion. Residual anterolisthesis is noted. IMPRESSION: L4-5 fusion. Electronically Signed   By: Inez Catalina M.D.   On: 11/12/2018 13:54    Disposition:   POD #1 s/p L4/5 decompression and fusion, doing well  - up with PT/OT, encourage ambulation - Percocet for pain, Valium for muscle spasms - likely d/c home today with f/u in 2 weeks  Signed: Justice Britain 11/20/2018, 12:03 PM

## 2018-11-24 ENCOUNTER — Telehealth: Payer: Self-pay | Admitting: *Deleted

## 2018-11-24 MED FILL — HYDROCHLOROTHIAZIDE 25 MG T: 25 | 30 days supply | Qty: 30 | Fill #1

## 2018-11-24 MED FILL — HUMIRA 40 MG/0.8ML PSKT: 40 | 28 days supply | Qty: 4 | Fill #3

## 2018-11-24 MED FILL — ROSUVASTATIN CALCIUM 5 MG T: 5 | 90 days supply | Qty: 90 | Fill #3

## 2018-11-24 MED FILL — MINOCYCLINE 50 MG CAPSULE: 50 | 30 days supply | Qty: 60 | Fill #2

## 2018-11-24 MED FILL — VENLAFAXINE HCL ER 150 MG C: 150 | 30 days supply | Qty: 30 | Fill #3

## 2018-11-24 NOTE — Telephone Encounter (Signed)
   Cardiac Questionnaire:    Since your last visit or hospitalization:    1. Have you been having new or worsening chest pain? No   2. Have you been having new or worsening shortness of breath? No 3. Have you been having new or worsening leg swelling, wt gain, or increase in abdominal girth (pants fitting more tightly)? No   4. Have you had any passing out spells? No    *A YES to any of these questions would result in the appointment being kept. *If all the answers to these questions are NO, we should indicate that given the current situation regarding the worldwide coronarvirus pandemic, at the recommendation of the CDC, we are looking to limit gatherings in our waiting area, and thus will reschedule their appointment beyond four weeks from today.   _____________   [id number] Pre-Screening Questions:  . Do you currently have a fever? No (yes = cancel and refer to pcp for e-visit) . Have you recently travelled on a cruise, internationally, or to [place], [place], [place], [place], [place], or [address], [place] [employer]) ? No (yes = cancel, stay home, monitor symptoms, and contact pcp or initiate e-visit if symptoms develop) . Have you been in contact with someone that is currently pending confirmation of Covid19 testing or has been confirmed to have the [place] virus?  No (yes = cancel, stay home, away from tested individual, monitor symptoms, and contact pcp or initiate e-visit if symptoms develop) . Are you currently experiencing fatigue or cough? No (yes = pt should be prepared to have a mask placed at the time of their visit).         Primary Cardiologist:  No primary care provider on file.   Patient contacted.  History reviewed.  No symptoms to suggest any unstable cardiac conditions.  Based on discussion, with current pandemic situation, we will be postponing this appointment for Adrienne Campbell If symptoms change, she has been instructed to contact our office.   Routing to C19 CANCEL pool for  tracking (P CV DIV CV19 CANCEL) and assigning priority (1 = 4-6 wks, 2 = 6-12 wks, 3 = >12 wks).  [name]  11/24/2018 4:40 PM         .

## 2018-11-25 ENCOUNTER — Ambulatory Visit: Payer: 59 | Admitting: Cardiovascular Disease

## 2018-12-05 MED FILL — ESTRADIOL 10 MCG TABS: 10 | 84 days supply | Qty: 24 | Fill #0

## 2018-12-16 MED FILL — HUMIRA 40 MG/0.8ML PSKT: 40 | 28 days supply | Qty: 4 | Fill #4

## 2018-12-16 MED FILL — MINOCYCLINE 50 MG CAPSULE: 50 | 30 days supply | Qty: 60 | Fill #3

## 2018-12-16 MED FILL — HYDROCHLOROTHIAZIDE 25 MG T: 25 | 30 days supply | Qty: 30 | Fill #2

## 2018-12-16 MED FILL — VENLAFAXINE HCL ER 150 MG C: 150 | 30 days supply | Qty: 30 | Fill #4

## 2018-12-24 ENCOUNTER — Telehealth: Payer: Self-pay | Admitting: Cardiovascular Disease

## 2018-12-24 NOTE — Telephone Encounter (Signed)
LVM to call back and to reschedule new patient appointment.

## 2019-01-07 ENCOUNTER — Other Ambulatory Visit: Payer: Self-pay | Admitting: Pharmacist

## 2019-01-07 MED ORDER — ADALIMUMAB 40 MG/0.4ML ~~LOC~~ AJKT: 1.0000 "pen " | AUTO-INJECTOR | SUBCUTANEOUS | 5 refills | Status: DC

## 2019-01-07 MED FILL — VENLAFAXINE HCL ER 150 MG C: 150 | 30 days supply | Qty: 30 | Fill #5

## 2019-01-07 MED FILL — HYDROCHLOROTHIAZIDE 25 MG T: 25 | 30 days supply | Qty: 30 | Fill #3

## 2019-01-07 MED FILL — HUMIRA PEN 40 MG/0.4ML PNKT: 40 | 28 days supply | Qty: 4 | Fill #0

## 2019-01-07 MED FILL — predniSONE 5 MG TABS: 5 | 12 days supply | Qty: 48 | Fill #0

## 2019-01-07 MED FILL — MINOCYCLINE 50 MG CAPSULE: 50 | 30 days supply | Qty: 60 | Fill #4

## 2019-01-08 MED FILL — DAPSONE 5% GEL: 5 | 30 days supply | Qty: 60 | Fill #0

## 2019-01-22 MED FILL — CELECOXIB 200 MG CAPS: 200 | 90 days supply | Qty: 180 | Fill #0

## 2019-02-12 MED FILL — HYDROXYZINE PAMOATE 25 MG C: 25 | 90 days supply | Qty: 90 | Fill #2

## 2019-02-12 MED FILL — HYDROCHLOROTHIAZIDE 25 MG T: 25 | 30 days supply | Qty: 30 | Fill #4

## 2019-02-13 MED FILL — VENLAFAXINE HCL ER 150 MG C: 150 | 30 days supply | Qty: 30 | Fill #6

## 2019-02-26 ENCOUNTER — Other Ambulatory Visit: Payer: Self-pay | Admitting: Pharmacist

## 2019-02-26 MED ORDER — HUMIRA (2 PEN) 40 MG/0.4ML ~~LOC~~ AJKT: 1.0000 "pen " | AUTO-INJECTOR | SUBCUTANEOUS | 5 refills | Status: DC

## 2019-02-26 MED FILL — ROSUVASTATIN CALCIUM 5 MG T: 5 | 90 days supply | Qty: 90 | Fill #4

## 2019-02-26 MED FILL — DAPSONE 5% GEL: 5 | 30 days supply | Qty: 60 | Fill #1

## 2019-02-26 MED FILL — ESTRADIOL 10 MCG TABS: 10 | 84 days supply | Qty: 24 | Fill #1

## 2019-03-03 ENCOUNTER — Other Ambulatory Visit: Payer: Self-pay | Admitting: Pharmacist

## 2019-03-03 MED ORDER — HUMIRA (2 SYRINGE) 40 MG/0.4ML ~~LOC~~ PSKT: 1.0000 | PREFILLED_SYRINGE | SUBCUTANEOUS | 5 refills | Status: DC

## 2019-03-03 MED ORDER — HUMIRA 40 MG/0.8ML ~~LOC~~ PSKT: 1.0000 | PREFILLED_SYRINGE | SUBCUTANEOUS | 5 refills | Status: DC

## 2019-03-03 MED FILL — HUMIRA 40 MG/0.8ML PSKT: 40 | 28 days supply | Qty: 4 | Fill #0

## 2019-03-05 ENCOUNTER — Other Ambulatory Visit: Payer: Self-pay | Admitting: Orthopedic Surgery

## 2019-03-16 MED FILL — HYDROCHLOROTHIAZIDE 25 MG T: 25 | 30 days supply | Qty: 30 | Fill #5

## 2019-04-02 MED FILL — HUMIRA 40 MG/0.8ML PSKT: 40 | 28 days supply | Qty: 4 | Fill #1

## 2019-04-02 MED FILL — VENLAFAXINE HCL ER 150 MG C: 150 | 30 days supply | Qty: 30 | Fill #7

## 2019-04-13 MED FILL — MINOCYCLINE 50 MG CAPSULE: 50 | 30 days supply | Qty: 60 | Fill #0

## 2019-04-13 MED FILL — CELECOXIB 200 MG CAPSULE: 200 | 90 days supply | Qty: 180 | Fill #0

## 2019-04-13 MED FILL — HYDROCHLOROTHIAZIDE 25 MG T: 25 | 30 days supply | Qty: 30 | Fill #0

## 2019-04-20 MED FILL — CYCLOBENZAPRINE HCL 10 MG T: 10 | 30 days supply | Qty: 30 | Fill #0

## 2019-04-22 NOTE — Patient Instructions (Addendum)
YOU HAD  A COVID 19 TEST ON 04-23-2019. ONCE YOUR COVID TEST IS COMPLETED, PLEASE BEGIN THE QUARANTINE INSTRUCTIONS AS OUTLINED IN YOUR HANDOUT.                Adrienne Campbell     Your procedure is scheduled on: 04-27-2019  Report to [facility] Main  Entrance   Report to admitting at 1130 AM   [address] [place]. NO VISITORS ARE ALLOWED IN SHORT STAY OR RECOVERY ROOM.   Call this number if you have problems the morning of surgery [phone]    Remember: [facility], NO CHEWING GUM CANDY OR MINTS.   NO SOLID FOOD AFTER MIDNIGHT THE NIGHT PRIOR TO SURGERY. NOTHING BY MOUTH EXCEPT CLEAR LIQUIDS UNTIL 1100 AM . PLEASE FINISH ENSURE DRINK PER SURGEON ORDER  WHICH NEEDS TO BE COMPLETED AT 1100 AM . Then nothing by mouth   CLEAR LIQUID DIET   Foods Allowed                                                                     Foods Excluded  Coffee and tea, regular and decaf                             liquids that you cannot  Plain Jell-O any favor except red or purple                                           see through such as: Fruit ices (not with fruit pulp)                                     milk, soups, orange juice  Iced Popsicles                                    All solid food Carbonated beverages, regular and diet                                    Cranberry, grape and apple juices Sports drinks like Gatorade Lightly seasoned clear broth or consume(fat free) Sugar, honey syrup  Sample Menu Breakfast                                Lunch                                     Supper Cranberry juice                    Beef broth  Chicken broth Jell-O                                     Grape juice                           Apple juice Coffee or tea                        Jell-O                                      Popsicle                                                 Coffee or tea                        Coffee or tea  _____________________________________________________________________     Take these medicines the morning of surgery with A SIP OF WATER: effexor, crestor, tylenol if needed                               You may not have any metal on your body including hair pins and              piercings  Do not wear jewelry, make-up, lotions, powders or perfumes, deodorant             Do not wear nail polish.  Do not shave  48 hours prior to surgery.               Do not bring valuables to the hospital. [place].  Contacts, dentures or bridgework may not be worn into surgery.    ___________________________________________________________________           [facility] - Preparing for Surgery Before surgery, you can play an important role.  Because skin is not sterile, your skin needs to be as free of germs as possible.  You can reduce the number of germs on your skin by washing with CHG (chlorahexidine gluconate) soap before surgery.  CHG is an antiseptic cleaner which kills germs and bonds with the skin to continue killing germs even after washing. Please DO NOT use if you have an allergy to CHG or antibacterial soaps.  If your skin becomes reddened/irritated stop using the CHG and inform your nurse when you arrive at Short Stay. Do not shave (including legs and underarms) for at least 48 hours prior to the first CHG shower.  You may shave your face/neck. Please follow these instructions carefully:  1.  Shower with CHG Soap the night before surgery and the  morning of Surgery.  2.  If you choose to wash your hair, wash your hair first as usual with your  normal  shampoo.  3.  After you shampoo, rinse your hair and body thoroughly to remove the  shampoo.  4.  Use CHG as you would any other liquid soap.  You can apply chg directly  to the skin  and wash                       Gently with a scrungie or clean washcloth.  5.  Apply the CHG Soap to your body ONLY FROM THE NECK DOWN.   Do not use on face/ open                           Wound or open sores. Avoid contact with eyes, ears mouth and genitals (private parts).                       Wash face,  Genitals (private parts) with your normal soap.             6.  Wash thoroughly, paying special attention to the area where your surgery  will be performed.  7.  Thoroughly rinse your body with warm water from the neck down.  8.  DO NOT shower/wash with your normal soap after using and rinsing off  the CHG Soap.                9.  Pat yourself dry with a clean towel.            10.  Wear clean pajamas.            11.  Place clean sheets on your bed the night of your first shower and do not  sleep with pets. Day of Surgery : Do not apply any lotions/deodorants the morning of surgery.  Please wear clean clothes to the hospital/surgery center.  FAILURE TO FOLLOW THESE INSTRUCTIONS MAY RESULT IN THE CANCELLATION OF YOUR SURGERY PATIENT SIGNATURE_________________________________  NURSE SIGNATURE__________________________________  ________________________________________________________________________   Adrienne Campbell  An incentive spirometer is a tool that can help keep your lungs clear and active. This tool measures how well you are filling your lungs with each breath. Taking long deep breaths may help reverse or decrease the chance of developing breathing (pulmonary) problems (especially infection) following:  A long period of time when you are unable to move or be active. BEFORE THE PROCEDURE   If the spirometer includes an indicator to show your best effort, your nurse or respiratory therapist will set it to a desired goal.  If possible, sit up straight or lean slightly forward. Try not to slouch.  Hold the incentive spirometer in an upright position. INSTRUCTIONS FOR USE   1. Sit on the edge of your bed if possible, or sit up as far as you can in bed or on a chair. 2. Hold the incentive spirometer in an upright position. 3. Breathe out normally. 4. Place the mouthpiece in your mouth and seal your lips tightly around it. 5. Breathe in slowly and as deeply as possible, raising the piston or the ball toward the top of the column. 6. Hold your breath for 3-5 seconds or for as long as possible. Allow the piston or ball to fall to the bottom of the column. 7. Remove the mouthpiece from your mouth and breathe out normally. 8. Rest for a few seconds and repeat Steps 1 through 7 at least 10 times every 1-2 hours when you are awake. Take your time and take a few normal breaths between deep breaths. 9. The spirometer may include an indicator to  show your best effort. Use the indicator as a goal to work toward during each repetition. 10. After each set of 10 deep breaths, practice coughing to be sure your lungs are clear. If you have an incision (the cut made at the time of surgery), support your incision when coughing by placing a pillow or rolled up towels firmly against it. Once you are able to get out of bed, walk around indoors and cough well. You may stop using the incentive spirometer when instructed by your caregiver.  RISKS AND COMPLICATIONS  Take your time so you do not get dizzy or light-headed.  If you are in pain, you may need to take or ask for pain medication before doing incentive spirometry. It is harder to take a deep breath if you are having pain. AFTER USE  Rest and breathe slowly and easily.  It can be helpful to keep track of a log of your progress. Your caregiver can provide you with a simple table to help with this. If you are using the spirometer at home, follow these instructions: [place] IF:   You are having difficultly using the spirometer.  You have trouble using the spirometer as often as instructed.  Your pain medication is  not giving enough relief while using the spirometer.  You develop fever of 100.5 F (38.1 C) or higher. SEEK IMMEDIATE MEDICAL CARE IF:   You cough up bloody sputum that had not been present before.  You develop fever of 102 F (38.9 C) or greater.  You develop worsening pain at or near the incision site. MAKE SURE YOU:   Understand these instructions.  Will watch your condition.  Will get help right away if you are not doing well or get worse. Document Released: 12/31/2006 Document Revised: 11/12/2011 Document Reviewed: 03/03/2007 ExitCare Patient Information 2014 ExitCare, [place].   ________________________________________________________________________  WHAT IS A BLOOD TRANSFUSION? Blood Transfusion Information  A transfusion is the replacement of blood or some of its parts. Blood is made up of multiple cells which provide different functions.  Red blood cells carry oxygen and are used for blood loss replacement.  White blood cells fight against infection.  Platelets control bleeding.  Plasma helps clot blood.  Other blood products are available for specialized needs, such as hemophilia or other clotting disorders. BEFORE THE TRANSFUSION  Who gives blood for transfusions?   Healthy volunteers who are fully evaluated to make sure their blood is safe. This is blood bank blood. Transfusion therapy is the safest it has ever been in the practice of medicine. Before blood is taken from a donor, a complete history is taken to make sure that person has no history of diseases nor engages in risky social behavior (examples are intravenous drug use or sexual activity with multiple partners). The donor's travel history is screened to minimize risk of transmitting infections, such as malaria. The donated blood is tested for signs of infectious diseases, such as HIV and hepatitis. The blood is then tested to be sure it is compatible with you in order to minimize the chance of a  transfusion reaction. If you or a relative donates blood, this is often done in anticipation of surgery and is not appropriate for emergency situations. It takes many days to process the donated blood. RISKS AND COMPLICATIONS Although transfusion therapy is very safe and saves many lives, the main dangers of transfusion include:   Getting an infectious disease.  Developing a transfusion reaction. This is an allergic reaction  to something in the blood you were given. Every precaution is taken to prevent this. The decision to have a blood transfusion has been considered carefully by your caregiver before blood is given. Blood is not given unless the benefits outweigh the risks. AFTER THE TRANSFUSION  Right after receiving a blood transfusion, you will usually feel much better and more energetic. This is especially true if your red blood cells have gotten low (anemic). The transfusion raises the level of the red blood cells which carry oxygen, and this usually causes an energy increase.  The nurse administering the transfusion will monitor you carefully for complications. HOME CARE INSTRUCTIONS  No special instructions are needed after a transfusion. You may find your energy is better. Speak with your caregiver about any limitations on activity for underlying diseases you may have. SEEK MEDICAL CARE IF:   Your condition is not improving after your transfusion.  You develop redness or irritation at the intravenous (IV) site. SEEK IMMEDIATE MEDICAL CARE IF:  Any of the following symptoms occur over the next 12 hours:  Shaking chills.  You have a temperature by mouth above 102 F (38.9 C), not controlled by medicine.  Chest, back, or muscle pain.  People around you feel you are not acting correctly or are confused.  Shortness of breath or difficulty breathing.  Dizziness and fainting.  You get a rash or develop hives.  You have a decrease in urine output.  Your urine turns a dark  color or changes to pink, red, or brown. Any of the following symptoms occur over the next 10 days:  You have a temperature by mouth above 102 F (38.9 C), not controlled by medicine.  Shortness of breath.  Weakness after normal activity.  The white part of the eye turns yellow (jaundice).  You have a decrease in the amount of urine or are urinating less often.  Your urine turns a dark color or changes to pink, red, or brown. Document Released: 08/17/2000 Document Revised: 11/12/2011 Document Reviewed: 04/05/2008 [facility] Patient Information 2014 [place], [place].  _______________________________________________________________________

## 2019-04-23 ENCOUNTER — Ambulatory Visit (HOSPITAL_COMMUNITY)
Admission: RE | Admit: 2019-04-23 | Discharge: 2019-04-23 | Disposition: A | Discharge status: Home / Self Care | Payer: No Typology Code available for payment source | Source: Ambulatory Visit | Attending: Orthopedic Surgery | Admitting: Orthopedic Surgery

## 2019-04-23 ENCOUNTER — Other Ambulatory Visit: Payer: Self-pay

## 2019-04-23 ENCOUNTER — Encounter (HOSPITAL_COMMUNITY)
Admission: RE | Admit: 2019-04-23 | Discharge: 2019-04-23 | Disposition: A | Discharge status: Home / Self Care | Payer: No Typology Code available for payment source | Source: Ambulatory Visit | Attending: Orthopedic Surgery | Admitting: Orthopedic Surgery

## 2019-04-23 ENCOUNTER — Other Ambulatory Visit (HOSPITAL_COMMUNITY)
Admission: RE | Admit: 2019-04-23 | Discharge: 2019-04-23 | Disposition: A | Discharge status: Home / Self Care | Payer: No Typology Code available for payment source | Source: Ambulatory Visit

## 2019-04-23 ENCOUNTER — Encounter (HOSPITAL_COMMUNITY): Payer: Self-pay

## 2019-04-23 DIAGNOSIS — Z20828 Contact with and (suspected) exposure to other viral communicable diseases: Secondary | ICD-10-CM | POA: Insufficient documentation

## 2019-04-23 DIAGNOSIS — Z01818 Encounter for other preprocedural examination: Secondary | ICD-10-CM | POA: Diagnosis present

## 2019-04-23 HISTORY — DX: Anxiety disorder, unspecified: F41.9

## 2019-04-23 HISTORY — DX: Cardiac murmur, unspecified: R01.1

## 2019-04-23 LAB — APTT: aPTT: 29 seconds (ref 24–36)

## 2019-04-23 LAB — BASIC METABOLIC PANEL
Anion gap: 10 (ref 5–15)
BUN: 9 mg/dL (ref 6–20)
CO2: 28 mmol/L (ref 22–32)
Calcium: 9.5 mg/dL (ref 8.9–10.3)
Chloride: 100 mmol/L (ref 98–111)
Creatinine, Ser: 0.61 mg/dL (ref 0.44–1.00)
GFR calc Af Amer: >60 mL/min (ref >60)
GFR calc non Af Amer: >60 mL/min (ref >60)
Glucose, Bld: 92 mg/dL (ref 70–99)
Potassium: 3.5 mmol/L (ref 3.5–5.1)
Sodium: 138 mmol/L (ref 135–145)

## 2019-04-23 LAB — URINALYSIS, ROUTINE W REFLEX MICROSCOPIC
Bilirubin Urine: NEGATIVE
Glucose, UA: NEGATIVE mg/dL
Hgb urine dipstick: NEGATIVE
Ketones, ur: NEGATIVE mg/dL
Leukocytes,Ua: NEGATIVE
Nitrite: NEGATIVE
Protein, ur: NEGATIVE mg/dL
Specific Gravity, Urine: 1.004 — ABNORMAL LOW (ref 1.005–1.030)
pH: 6 (ref 5.0–8.0)

## 2019-04-23 LAB — PROTIME-INR
INR: 0.9 (ref 0.8–1.2)
Prothrombin Time: 12.1 seconds (ref 11.4–15.2)

## 2019-04-23 LAB — CBC WITH DIFFERENTIAL/PLATELET
Abs Immature Granulocytes: 0.02 10*3/uL (ref 0.00–0.07)
Basophils Absolute: 0.1 10*3/uL (ref 0.0–0.1)
Basophils Relative: 1 %
Eosinophils Absolute: 0.2 10*3/uL (ref 0.0–0.5)
Eosinophils Relative: 2 %
HCT: 44.8 % (ref 36.0–46.0)
Hemoglobin: 15 g/dL (ref 12.0–15.0)
Immature Granulocytes: 0 %
Lymphocytes Relative: 51 %
Lymphs Abs: 5.1 10*3/uL — ABNORMAL HIGH (ref 0.7–4.0)
MCH: 34.1 pg — ABNORMAL HIGH (ref 26.0–34.0)
MCHC: 33.5 g/dL (ref 30.0–36.0)
MCV: 101.8 fL — ABNORMAL HIGH (ref 80.0–100.0)
Monocytes Absolute: 0.8 10*3/uL (ref 0.1–1.0)
Monocytes Relative: 8 %
Neutro Abs: 3.8 10*3/uL (ref 1.7–7.7)
Neutrophils Relative %: 38 %
Platelets: 387 10*3/uL (ref 150–400)
RBC: 4.4 MIL/uL (ref 3.87–5.11)
RDW: 11.8 % (ref 11.5–15.5)
WBC: 10 10*3/uL (ref 4.0–10.5)
nRBC: 0 % (ref 0.0–0.2)

## 2019-04-23 LAB — SURGICAL PCR SCREEN
MRSA, PCR: NEGATIVE
Staphylococcus aureus: NEGATIVE

## 2019-04-24 DIAGNOSIS — M1611 Unilateral primary osteoarthritis, right hip: Secondary | ICD-10-CM | POA: Diagnosis present

## 2019-04-24 LAB — ABO/RH: ABO/RH(D): A NEG

## 2019-04-24 LAB — SARS CORONAVIRUS 2 (TAT 6-24 HRS): SARS Coronavirus 2: NEGATIVE

## 2019-04-24 NOTE — H&P (Signed)
TOTAL HIP ADMISSION H&P  Patient is admitted for right total hip arthroplasty.  Subjective:  Chief Complaint: right hip pain  HPI: Adrienne Campbell, 58 y.o. female, has a history of pain and functional disability in the right hip(s) due to arthritis and patient has failed non-surgical conservative treatments for greater than 12 weeks to include NSAID's and/or analgesics, corticosteriod injections, weight reduction as appropriate and activity modification.  Onset of symptoms was gradual starting 1 years ago with gradually worsening course since that time.The patient noted no past surgery on the right hip(s).  Patient currently rates pain in the right hip at 10 out of 10 with activity. Patient has night pain, worsening of pain with activity and weight bearing, trendelenberg gait, pain that interfers with activities of daily living and pain with passive range of motion. Patient has evidence of joint space narrowing by imaging studies. This condition presents safety issues increasing the risk of falls.   There is no current active infection.  Patient Active Problem List   Diagnosis Date Noted  . Radiculopathy 11/12/2018  . Dislocation of hip prosthesis, initial encounter ([place]) 12/03/2017  . Primary osteoarthritis of right knee 08/11/2014  . Arthritis of knee 08/11/2014  . Arthritis of left hip 06/28/2014  . Arthritis, hip 06/28/2014  . Insomnia 03/03/2012  . 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 Osteoarthritis of multiple joints 12/04/2010  . Allergic rhinitis 09/03/2010  . Hypercholesteremia 09/04/2007  . Postmenopausal HRT (hormone replacement therapy) 09/03/2005  . Depression 09/03/2001   Past Medical History:  Diagnosis Date  . Anemia   . Anxiety   . Arthritis   . Depression   . Environmental and seasonal allergies   . Heart murmur    during pregnancy  . Hyperlipidemia   . Hypertension   . Insomnia   . Pneumonia    hx of  . PONV (postoperative nausea and vomiting)    Nausea,  per pt. "felt loopy after hip surgery"   and made hallucinate    Past Surgical History:  Procedure Laterality Date  . ABDOMINAL HYSTERECTOMY    . COLONOSCOPY    . TOTAL HIP ARTHROPLASTY Left 06/28/2014   Procedure: LEFT TOTAL HIP ARTHROPLASTY;  Surgeon: Kerin Salen, MD;  Location: [place];  Service: Orthopedics;  Laterality: Left;  . TOTAL KNEE ARTHROPLASTY Right 08/11/2014   Procedure: RIGHT TOTAL KNEE ARTHROPLASTY;  Surgeon: Kerin Salen, MD;  Location: [place];  Service: Orthopedics;  Laterality: Right;  . TRANSFORAMINAL LUMBAR INTERBODY FUSION (TLIF) WITH PEDICLE SCREW FIXATION 1 LEVEL Right 11/12/2018   Procedure: RIGHT-SIDED LUMBAR 4-5 TRANSFORAMINAL LUMBAR INTERBODY FUSION WITH INSTRUMENTATION AND ALLOGRAFT;  Surgeon: Phylliss Bob, MD;  Location: [place];  Service: Orthopedics;  Laterality: Right;    No current facility-administered medications for this encounter.    Current Outpatient Medications  Medication Sig Dispense Refill Last Dose  . acetaminophen (TYLENOL) 650 MG CR tablet Take 1,300 mg by mouth 2 (two) times daily.     Marland Kitchen acyclovir (ZOVIRAX) 200 MG capsule Take 400 mg by mouth 3 (three) times daily as needed (cold sores).     . Adalimumab (HUMIRA) 40 MG/0.8ML PSKT Inject 0.8 mLs (40 mg total) into the skin once a week. 4 each 5   . celecoxib (CELEBREX) 200 MG capsule Take 200 mg by mouth 2 (two) times daily.     . cholecalciferol (VITAMIN D) 25 MCG (1000 UT) tablet Take 1,000 Units by mouth daily.     Marland Kitchen CITRUS BERGAMOT PO Take 1 tablet by  mouth every morning.      . Cyanocobalamin (VITAMIN B-12) 5000 MCG TBDP Take 5,000 mcg by mouth daily.     . cyclobenzaprine (FLEXERIL) 10 MG tablet Take 10 mg by mouth 3 (three) times daily as needed for muscle spasms.     . Dapsone 5 % topical gel Apply 1 application topically daily as needed.     . Estradiol (VAGIFEM) 10 MCG TABS vaginal tablet Place 10 mcg vaginally 2 (two) times a week.      . hydrochlorothiazide (HYDRODIURIL) 25 MG  tablet Take 25 mg by mouth daily.     . hydrOXYzine (VISTARIL) 25 MG capsule Take 25 mg by mouth at bedtime.      . minocycline (MINOCIN,DYNACIN) 50 MG capsule Take 50 mg by mouth 2 (two) times daily.     . Omega-3 1000 MG CAPS Take 1,000 mg by mouth at bedtime.     Vladimir Faster Glycol-Propyl Glycol (LUBRICANT EYE DROPS) 0.4-0.3 % SOLN Place 1 drop into both eyes 4 (four) times daily as needed (dry/irritated eyes).     . rosuvastatin (CRESTOR) 5 MG tablet Take 5 mg by mouth every morning.      . venlafaxine XR (EFFEXOR XR) 150 MG 24 hr capsule Take 150 mg by mouth at bedtime.      . vitamin E 400 UNIT capsule Take 400 Units by mouth at bedtime.       Allergies  Allergen Reactions  . Nsaids Hives    Specifically meloxicam and ibuprofen reported  . Onion Swelling    SWELLING REACTION UNSPECIFIED   . Sulfa Antibiotics Swelling    Patient reports tongue swells  . Fluticasone Other (See Comments)    ulcers  . Oxycodone Nausea Only    Severe nausea; pt has to take with Zofran  . Tramadol Hives  . Hydrocodone-Acetaminophen Other (See Comments)    Hallucinations  . Codeine Nausea And Vomiting  . Gluten Meal Diarrhea    Social History   Tobacco Use  . Smoking status: Never Smoker  . Smokeless tobacco: Never Used  Substance Use Topics  . Alcohol use: No    Comment: RARE    No family history on file.   Review of Systems  Constitutional: Positive for diaphoresis and malaise/fatigue.  HENT: Negative.   Eyes: Negative.   Respiratory: Negative.   Cardiovascular:       Htn  Gastrointestinal: Positive for constipation.  Genitourinary:       Weak stream  Musculoskeletal: Positive for joint pain.  Skin: Negative.   Neurological: Negative.   Endo/Heme/Allergies: Positive for polydipsia.  Psychiatric/Behavioral: Positive for depression and suicidal ideas. The patient is nervous/anxious.     Objective:  Physical Exam  Constitutional: She is oriented to person, place, and time. She  appears well-developed and well-nourished.  HENT:  Head: Normocephalic and atraumatic.  Eyes: Pupils are equal, round, and reactive to light.  Neck: Normal range of motion. Neck supple.  Cardiovascular: Intact distal pulses.  Respiratory: Effort normal.  Musculoskeletal:        General: Tenderness present.     Comments: the patient's left hip has excellent range of motion and strength.  She has a range from 30 of internal and external rotation.  Patient's left hip does have obvious irritability with motion.  She has reduced motion and has approximately 15 of internal and external rotation.  Tenderness with palpation of the right groin.  No lateral pain over the trochanteric bursal region.  Neurological: She is  alert and oriented to person, place, and time.  Skin: Skin is warm and dry.  Psychiatric: She has a normal mood and affect. Her behavior is normal. Judgment and thought content normal.    Vital signs in last 24 hours: Temp:  [98.4 F (36.9 C)] 98.4 F (36.9 C) (08/20 1532) Resp:  [16] 16 (08/20 1532) BP: (152-154)/(90-97) 154/90 (08/20 1551) SpO2:  [100 %] 100 % (08/20 1532) Weight:  [63.5 kg] 63.5 kg (08/20 1532)  Labs:   Estimated body mass index is 24.03 kg/m as calculated from the following:   Height as of 04/23/19: 5\' 4"  (1.626 m).   Weight as of 04/23/19: 63.5 kg.   Imaging Review Plain radiographs demonstrate AP of the pelvis and crosstable lateral of the right hip are taken and reviewed in office today.  This shows a well-placed well fixed left total hip arthroplasty with S-ROM stem.  Patient's right hip has severe arthritis of the superior weightbearing surface and medial pole and this has rapidly progressed from her last x-rays approximately one year ago.   Assessment/Plan:  End stage arthritis, right hip(s)  The patient history, physical examination, clinical judgement of the provider and imaging studies are consistent with end stage degenerative joint  disease of the right hip(s) and total hip arthroplasty is deemed medically necessary. The treatment options including medical management, injection therapy, arthroscopy and arthroplasty were discussed at length. The risks and benefits of total hip arthroplasty were presented and reviewed. The risks due to aseptic loosening, infection, stiffness, dislocation/subluxation,  thromboembolic complications and other imponderables were discussed.  The patient acknowledged the explanation, agreed to proceed with the plan and consent was signed. Patient is being admitted for inpatient treatment for surgery, pain control, PT, OT, prophylactic antibiotics, VTE prophylaxis, progressive ambulation and ADL's and discharge planning.The patient is planning to be discharged home with home health services    Patient's anticipated Campbell is less than 2 midnights, meeting these requirements: - Younger than 38 - Lives within 1 hour of care - Has a competent adult at home to recover with post-op recover - NO history of  - Chronic pain requiring opiods  - Diabetes  - Coronary Artery Disease  - Heart failure  - Heart attack  - Stroke  - DVT/VTE  - Cardiac arrhythmia  - Respiratory Failure/COPD  - Renal failure  - Anemia  - Advanced Liver disease

## 2019-04-26 MED ORDER — BUPIVACAINE LIPOSOME 1.3 % IJ SUSP
10.0000 mL | INTRAMUSCULAR | Status: DC
  Filled 2019-04-26: qty 10

## 2019-04-26 MED ORDER — TRANEXAMIC ACID 1000 MG/10ML IV SOLN
2000.0000 mg | INTRAVENOUS | Status: DC
  Filled 2019-04-26: qty 20

## 2019-04-27 ENCOUNTER — Inpatient Hospital Stay (HOSPITAL_COMMUNITY): Payer: No Typology Code available for payment source | Admitting: Physician Assistant

## 2019-04-27 ENCOUNTER — Other Ambulatory Visit: Payer: Self-pay

## 2019-04-27 ENCOUNTER — Encounter (HOSPITAL_COMMUNITY): Payer: Self-pay | Admitting: *Deleted

## 2019-04-27 ENCOUNTER — Inpatient Hospital Stay (HOSPITAL_COMMUNITY): Payer: No Typology Code available for payment source

## 2019-04-27 ENCOUNTER — Encounter (HOSPITAL_COMMUNITY)
Admission: RE | Disposition: A | Discharge status: Home / Self Care | Payer: Self-pay | Source: Home / Self Care | Attending: Orthopedic Surgery

## 2019-04-27 ENCOUNTER — Observation Stay (HOSPITAL_COMMUNITY)
Admission: RE | Admit: 2019-04-27 | Discharge: 2019-04-28 | Disposition: A | Discharge status: Home / Self Care | Payer: No Typology Code available for payment source | Attending: Orthopedic Surgery | Admitting: Orthopedic Surgery

## 2019-04-27 ENCOUNTER — Inpatient Hospital Stay (HOSPITAL_COMMUNITY): Payer: No Typology Code available for payment source | Admitting: Certified Registered Nurse Anesthetist

## 2019-04-27 DIAGNOSIS — Z791 Long term (current) use of non-steroidal anti-inflammatories (NSAID): Secondary | ICD-10-CM | POA: Insufficient documentation

## 2019-04-27 DIAGNOSIS — Z79899 Other long term (current) drug therapy: Secondary | ICD-10-CM | POA: Diagnosis not present

## 2019-04-27 DIAGNOSIS — F419 Anxiety disorder, unspecified: Secondary | ICD-10-CM | POA: Insufficient documentation

## 2019-04-27 DIAGNOSIS — E785 Hyperlipidemia, unspecified: Secondary | ICD-10-CM | POA: Diagnosis not present

## 2019-04-27 DIAGNOSIS — F329 Major depressive disorder, single episode, unspecified: Secondary | ICD-10-CM | POA: Insufficient documentation

## 2019-04-27 DIAGNOSIS — G4700 Insomnia, unspecified: Secondary | ICD-10-CM | POA: Diagnosis not present

## 2019-04-27 DIAGNOSIS — I10 Essential (primary) hypertension: Secondary | ICD-10-CM | POA: Diagnosis not present

## 2019-04-27 DIAGNOSIS — M1611 Unilateral primary osteoarthritis, right hip: Secondary | ICD-10-CM | POA: Diagnosis present

## 2019-04-27 DIAGNOSIS — Z419 Encounter for procedure for purposes other than remedying health state, unspecified: Secondary | ICD-10-CM

## 2019-04-27 DIAGNOSIS — R2681 Unsteadiness on feet: Secondary | ICD-10-CM | POA: Diagnosis not present

## 2019-04-27 DIAGNOSIS — Z96641 Presence of right artificial hip joint: Secondary | ICD-10-CM

## 2019-04-27 HISTORY — PX: TOTAL HIP ARTHROPLASTY: SHX124

## 2019-04-27 LAB — TYPE AND SCREEN
ABO/RH(D): A NEG
Antibody Screen: NEGATIVE

## 2019-04-27 LAB — GLUCOSE, CAPILLARY: Glucose-Capillary: 65 mg/dL — ABNORMAL LOW (ref 70–99)

## 2019-04-27 SURGERY — ARTHROPLASTY, HIP, TOTAL, ANTERIOR APPROACH
Anesthesia: Spinal | Laterality: Right

## 2019-04-27 MED ORDER — PHENYLEPHRINE 40 MCG/ML (10ML) SYRINGE FOR IV PUSH (FOR BLOOD PRESSURE SUPPORT)
PREFILLED_SYRINGE | INTRAVENOUS | Status: AC
  Filled 2019-04-27: qty 10

## 2019-04-27 MED ORDER — PROPOFOL 10 MG/ML IV BOLUS
INTRAVENOUS | Status: DC | PRN
  Administered 2019-04-27: 20 mg via INTRAVENOUS
  Administered 2019-04-27: 30 mg via INTRAVENOUS

## 2019-04-27 MED ORDER — VENLAFAXINE HCL ER 150 MG PO CP24
150.0000 mg | ORAL_CAPSULE | Freq: Every day | ORAL | Status: DC
  Administered 2019-04-28: 150 mg via ORAL
  Filled 2019-04-27: qty 1

## 2019-04-27 MED ORDER — KCL IN DEXTROSE-NACL 20-5-0.45 MEQ/L-%-% IV SOLN
INTRAVENOUS | Status: DC
  Administered 2019-04-27: 22:00:00 via INTRAVENOUS
  Filled 2019-04-27 (×2): qty 1000

## 2019-04-27 MED ORDER — BUPIVACAINE-EPINEPHRINE 0.25% -1:200000 IJ SOLN
INTRAMUSCULAR | Status: DC | PRN
  Administered 2019-04-27: 30 mL

## 2019-04-27 MED ORDER — ALUM & MAG HYDROXIDE-SIMETH 200-200-20 MG/5ML PO SUSP: 30.0000 mL | ORAL | Status: DC | PRN

## 2019-04-27 MED ORDER — HYDROCHLOROTHIAZIDE 25 MG PO TABS
25.0000 mg | ORAL_TABLET | Freq: Every day | ORAL | Status: DC
  Administered 2019-04-28: 25 mg via ORAL
  Filled 2019-04-27 (×2): qty 1

## 2019-04-27 MED ORDER — PROMETHAZINE HCL 25 MG/ML IJ SOLN: 6.2500 mg | INTRAMUSCULAR | Status: DC | PRN

## 2019-04-27 MED ORDER — ACETAMINOPHEN 10 MG/ML IV SOLN: 1000.0000 mg | Freq: Once | INTRAVENOUS | Status: DC | PRN

## 2019-04-27 MED ORDER — PANTOPRAZOLE SODIUM 40 MG PO TBEC
40.0000 mg | DELAYED_RELEASE_TABLET | Freq: Every day | ORAL | Status: DC
  Filled 2019-04-27 (×2): qty 1

## 2019-04-27 MED ORDER — CHLORHEXIDINE GLUCONATE 4 % EX LIQD: 60.0000 mL | Freq: Once | CUTANEOUS | Status: DC

## 2019-04-27 MED ORDER — METHOCARBAMOL 1000 MG/10ML IJ SOLN
500.0000 mg | Freq: Four times a day (QID) | INTRAVENOUS | Status: DC | PRN
  Filled 2019-04-27: qty 5

## 2019-04-27 MED ORDER — POVIDONE-IODINE 10 % EX SWAB
2.0000 "application " | Freq: Once | CUTANEOUS | Status: AC
  Administered 2019-04-27: 2 via TOPICAL

## 2019-04-27 MED ORDER — FENTANYL CITRATE (PF) 100 MCG/2ML IJ SOLN
INTRAMUSCULAR | Status: DC | PRN
  Administered 2019-04-27: 50 ug via INTRAVENOUS
  Administered 2019-04-27 (×2): 25 ug via INTRAVENOUS

## 2019-04-27 MED ORDER — PHENOL 1.4 % MT LIQD
1.0000 | OROMUCOSAL | Status: DC | PRN
  Filled 2019-04-27: qty 177

## 2019-04-27 MED ORDER — VITAMIN D3 25 MCG (1000 UNIT) PO TABS
1000.0000 [IU] | ORAL_TABLET | Freq: Every day | ORAL | Status: DC
  Filled 2019-04-27: qty 1

## 2019-04-27 MED ORDER — HYDROXYZINE HCL 25 MG PO TABS
25.0000 mg | ORAL_TABLET | Freq: Every day | ORAL | Status: DC
  Administered 2019-04-27: 25 mg via ORAL
  Filled 2019-04-27: qty 1

## 2019-04-27 MED ORDER — STERILE WATER FOR IRRIGATION IR SOLN
Status: DC | PRN
  Administered 2019-04-27: 2000 mL

## 2019-04-27 MED ORDER — SODIUM CHLORIDE 0.9 % IV SOLN
INTRAVENOUS | Status: DC | PRN
  Administered 2019-04-27: 15:00:00 50 ug/min via INTRAVENOUS

## 2019-04-27 MED ORDER — OXYCODONE HCL 5 MG PO TABS
5.0000 mg | ORAL_TABLET | ORAL | Status: DC | PRN
  Administered 2019-04-27 (×2): 5 mg via ORAL
  Administered 2019-04-28: 10 mg via ORAL
  Filled 2019-04-27: qty 1
  Filled 2019-04-27 (×2): qty 2
  Filled 2019-04-27: qty 1

## 2019-04-27 MED ORDER — 0.9 % SODIUM CHLORIDE (POUR BTL) OPTIME
TOPICAL | Status: DC | PRN
  Administered 2019-04-27: 15:00:00 1000 mL

## 2019-04-27 MED ORDER — POLYETHYLENE GLYCOL 3350 17 G PO PACK: 17.0000 g | PACK | Freq: Every day | ORAL | Status: DC | PRN

## 2019-04-27 MED ORDER — SODIUM CHLORIDE 0.9% FLUSH
INTRAVENOUS | Status: DC | PRN
  Administered 2019-04-27: 50 mL

## 2019-04-27 MED ORDER — ACETAMINOPHEN 500 MG PO TABS
1000.0000 mg | ORAL_TABLET | Freq: Four times a day (QID) | ORAL | Status: DC
  Administered 2019-04-27 – 2019-04-28 (×3): 1000 mg via ORAL
  Filled 2019-04-27 (×3): qty 2

## 2019-04-27 MED ORDER — CEFAZOLIN SODIUM-DEXTROSE 2-4 GM/100ML-% IV SOLN
2.0000 g | INTRAVENOUS | Status: AC
  Administered 2019-04-27: 2 g via INTRAVENOUS
  Filled 2019-04-27: qty 100

## 2019-04-27 MED ORDER — METOCLOPRAMIDE HCL 5 MG/ML IJ SOLN: 5.0000 mg | Freq: Three times a day (TID) | INTRAMUSCULAR | Status: DC | PRN

## 2019-04-27 MED ORDER — DOCUSATE SODIUM 100 MG PO CAPS
100.0000 mg | ORAL_CAPSULE | Freq: Two times a day (BID) | ORAL | Status: DC
  Administered 2019-04-27 – 2019-04-28 (×2): 100 mg via ORAL
  Filled 2019-04-27 (×2): qty 1

## 2019-04-27 MED ORDER — FENTANYL CITRATE (PF) 100 MCG/2ML IJ SOLN: 25.0000 ug | INTRAMUSCULAR | Status: DC | PRN

## 2019-04-27 MED ORDER — GABAPENTIN 300 MG PO CAPS
300.0000 mg | ORAL_CAPSULE | Freq: Three times a day (TID) | ORAL | Status: DC
  Administered 2019-04-27: 300 mg via ORAL
  Filled 2019-04-27 (×2): qty 1

## 2019-04-27 MED ORDER — VENLAFAXINE HCL ER 150 MG PO CP24: 150.0000 mg | ORAL_CAPSULE | Freq: Every day | ORAL | Status: DC

## 2019-04-27 MED ORDER — PROPOFOL 10 MG/ML IV BOLUS
INTRAVENOUS | Status: AC
  Filled 2019-04-27: qty 60

## 2019-04-27 MED ORDER — TIZANIDINE HCL 2 MG PO TABS: 2.0000 mg | ORAL_TABLET | Freq: Four times a day (QID) | ORAL | 0 refills | Status: DC | PRN

## 2019-04-27 MED ORDER — ACETAMINOPHEN 325 MG PO TABS: 325.0000 mg | ORAL_TABLET | Freq: Four times a day (QID) | ORAL | Status: DC | PRN

## 2019-04-27 MED ORDER — TRANEXAMIC ACID-NACL 1000-0.7 MG/100ML-% IV SOLN
1000.0000 mg | INTRAVENOUS | Status: AC
  Administered 2019-04-27: 15:00:00 1000 mg via INTRAVENOUS
  Filled 2019-04-27: qty 100

## 2019-04-27 MED ORDER — VITAMIN B-12 1000 MCG PO TABS
5000.0000 ug | ORAL_TABLET | Freq: Every day | ORAL | Status: DC
  Administered 2019-04-28: 5000 ug via ORAL
  Filled 2019-04-27: qty 5

## 2019-04-27 MED ORDER — HYDROMORPHONE HCL 1 MG/ML IJ SOLN: 0.5000 mg | INTRAMUSCULAR | Status: DC | PRN

## 2019-04-27 MED ORDER — ONDANSETRON HCL 4 MG PO TABS: 4.0000 mg | ORAL_TABLET | Freq: Every day | ORAL | 1 refills | Status: AC | PRN

## 2019-04-27 MED ORDER — BUPIVACAINE-EPINEPHRINE (PF) 0.25% -1:200000 IJ SOLN
INTRAMUSCULAR | Status: AC
  Filled 2019-04-27: qty 30

## 2019-04-27 MED ORDER — BUPIVACAINE LIPOSOME 1.3 % IJ SUSP
INTRAMUSCULAR | Status: DC | PRN
  Administered 2019-04-27: 10 mL

## 2019-04-27 MED ORDER — ONDANSETRON HCL 4 MG/2ML IJ SOLN
INTRAMUSCULAR | Status: DC | PRN
  Administered 2019-04-27: 4 mg via INTRAVENOUS

## 2019-04-27 MED ORDER — ACYCLOVIR 200 MG PO CAPS
400.0000 mg | ORAL_CAPSULE | Freq: Three times a day (TID) | ORAL | Status: DC | PRN
  Filled 2019-04-27: qty 2

## 2019-04-27 MED ORDER — FENTANYL CITRATE (PF) 100 MCG/2ML IJ SOLN
INTRAMUSCULAR | Status: AC
  Filled 2019-04-27: qty 2

## 2019-04-27 MED ORDER — MINOCYCLINE HCL 50 MG PO CAPS
50.0000 mg | ORAL_CAPSULE | Freq: Two times a day (BID) | ORAL | Status: DC
  Administered 2019-04-27 – 2019-04-28 (×2): 50 mg via ORAL
  Filled 2019-04-27 (×2): qty 1

## 2019-04-27 MED ORDER — METOCLOPRAMIDE HCL 5 MG PO TABS: 5.0000 mg | ORAL_TABLET | Freq: Three times a day (TID) | ORAL | Status: DC | PRN

## 2019-04-27 MED ORDER — LACTATED RINGERS IV SOLN
INTRAVENOUS | Status: DC
  Administered 2019-04-27 (×2): via INTRAVENOUS

## 2019-04-27 MED ORDER — OXYCODONE-ACETAMINOPHEN 5-325 MG PO TABS: 1.0000 | ORAL_TABLET | ORAL | 0 refills | Status: DC | PRN

## 2019-04-27 MED ORDER — TRANEXAMIC ACID-NACL 1000-0.7 MG/100ML-% IV SOLN
1000.0000 mg | Freq: Once | INTRAVENOUS | Status: AC
  Administered 2019-04-27: 19:00:00 1000 mg via INTRAVENOUS
  Filled 2019-04-27: qty 100

## 2019-04-27 MED ORDER — FLEET ENEMA 7-19 GM/118ML RE ENEM: 1.0000 | ENEMA | Freq: Once | RECTAL | Status: DC | PRN

## 2019-04-27 MED ORDER — METHOCARBAMOL 500 MG PO TABS
500.0000 mg | ORAL_TABLET | Freq: Four times a day (QID) | ORAL | Status: DC | PRN
  Administered 2019-04-27: 500 mg via ORAL
  Filled 2019-04-27: qty 1

## 2019-04-27 MED ORDER — BISACODYL 5 MG PO TBEC: 5.0000 mg | DELAYED_RELEASE_TABLET | Freq: Every day | ORAL | Status: DC | PRN

## 2019-04-27 MED ORDER — PROPOFOL 10 MG/ML IV BOLUS
INTRAVENOUS | Status: AC
  Filled 2019-04-27: qty 80

## 2019-04-27 MED ORDER — SODIUM CHLORIDE (PF) 0.9 % IJ SOLN
INTRAMUSCULAR | Status: AC
  Filled 2019-04-27: qty 50

## 2019-04-27 MED ORDER — POLYVINYL ALCOHOL 1.4 % OP SOLN
1.0000 [drp] | Freq: Four times a day (QID) | OPHTHALMIC | Status: DC | PRN
  Filled 2019-04-27: qty 15

## 2019-04-27 MED ORDER — DEXAMETHASONE SODIUM PHOSPHATE 10 MG/ML IJ SOLN
10.0000 mg | Freq: Once | INTRAMUSCULAR | Status: AC
  Administered 2019-04-28: 09:00:00 10 mg via INTRAVENOUS
  Filled 2019-04-27: qty 1

## 2019-04-27 MED ORDER — MIDAZOLAM HCL 5 MG/5ML IJ SOLN
INTRAMUSCULAR | Status: DC | PRN
  Administered 2019-04-27: 2 mg via INTRAVENOUS

## 2019-04-27 MED ORDER — ONDANSETRON HCL 4 MG PO TABS
4.0000 mg | ORAL_TABLET | Freq: Four times a day (QID) | ORAL | Status: DC | PRN
  Administered 2019-04-28: 4 mg via ORAL
  Filled 2019-04-27: qty 1

## 2019-04-27 MED ORDER — ASPIRIN EC 81 MG PO TBEC: 81.0000 mg | DELAYED_RELEASE_TABLET | Freq: Two times a day (BID) | ORAL | 0 refills | Status: DC

## 2019-04-27 MED ORDER — MENTHOL 3 MG MT LOZG: 1.0000 | LOZENGE | OROMUCOSAL | Status: DC | PRN

## 2019-04-27 MED ORDER — CELECOXIB 200 MG PO CAPS
200.0000 mg | ORAL_CAPSULE | Freq: Two times a day (BID) | ORAL | Status: DC
  Administered 2019-04-27 – 2019-04-28 (×2): 200 mg via ORAL
  Filled 2019-04-27 (×2): qty 1

## 2019-04-27 MED ORDER — ASPIRIN 81 MG PO CHEW
81.0000 mg | CHEWABLE_TABLET | Freq: Two times a day (BID) | ORAL | Status: DC
  Administered 2019-04-27 – 2019-04-28 (×2): 81 mg via ORAL
  Filled 2019-04-27 (×2): qty 1

## 2019-04-27 MED ORDER — EPHEDRINE 5 MG/ML INJ
INTRAVENOUS | Status: AC
  Filled 2019-04-27: qty 20

## 2019-04-27 MED ORDER — PHENYLEPHRINE HCL (PRESSORS) 10 MG/ML IV SOLN
INTRAVENOUS | Status: AC
  Filled 2019-04-27: qty 1

## 2019-04-27 MED ORDER — ONDANSETRON HCL 4 MG/2ML IJ SOLN
4.0000 mg | Freq: Four times a day (QID) | INTRAMUSCULAR | Status: DC | PRN
  Administered 2019-04-27: 4 mg via INTRAVENOUS
  Filled 2019-04-27: qty 2

## 2019-04-27 MED ORDER — PHENYLEPHRINE 40 MCG/ML (10ML) SYRINGE FOR IV PUSH (FOR BLOOD PRESSURE SUPPORT)
PREFILLED_SYRINGE | INTRAVENOUS | Status: DC | PRN
  Administered 2019-04-27: 80 ug via INTRAVENOUS
  Administered 2019-04-27: 120 ug via INTRAVENOUS
  Administered 2019-04-27: 80 ug via INTRAVENOUS
  Administered 2019-04-27: 120 ug via INTRAVENOUS

## 2019-04-27 MED ORDER — VITAMIN D 25 MCG (1000 UNIT) PO TABS
1000.0000 [IU] | ORAL_TABLET | Freq: Every day | ORAL | Status: DC
  Administered 2019-04-28: 09:00:00 1000 [IU] via ORAL
  Filled 2019-04-27: qty 1

## 2019-04-27 MED ORDER — DIPHENHYDRAMINE HCL 12.5 MG/5ML PO ELIX: 12.5000 mg | ORAL_SOLUTION | ORAL | Status: DC | PRN

## 2019-04-27 MED ORDER — DEXAMETHASONE SODIUM PHOSPHATE 10 MG/ML IJ SOLN
INTRAMUSCULAR | Status: DC | PRN
  Administered 2019-04-27: 5 mg via INTRAVENOUS

## 2019-04-27 MED ORDER — DAPSONE 5 % EX GEL: 1.0000 "application " | Freq: Every day | CUTANEOUS | Status: DC | PRN

## 2019-04-27 MED ORDER — MIDAZOLAM HCL 2 MG/2ML IJ SOLN
INTRAMUSCULAR | Status: AC
  Filled 2019-04-27: qty 2

## 2019-04-27 MED ORDER — ROSUVASTATIN CALCIUM 5 MG PO TABS: 5.0000 mg | ORAL_TABLET | Freq: Every day | ORAL | Status: DC

## 2019-04-27 MED ORDER — TRANEXAMIC ACID 1000 MG/10ML IV SOLN
INTRAVENOUS | Status: DC | PRN
  Administered 2019-04-27: 2000 mg via TOPICAL

## 2019-04-27 MED ORDER — PROPOFOL 500 MG/50ML IV EMUL
INTRAVENOUS | Status: DC | PRN
  Administered 2019-04-27: 100 ug/kg/min via INTRAVENOUS

## 2019-04-27 MED FILL — ONDANSETRON HCL 4 MG TABLET: 4 | 30 days supply | Qty: 30 | Fill #0

## 2019-04-27 MED FILL — ASPIRIN 81 MG TBEC: 81 | 30 days supply | Qty: 60 | Fill #0

## 2019-04-27 MED FILL — OXYCODONE-ACETAMINOPHEN 5-3: 5-325 | 5 days supply | Qty: 30 | Fill #0

## 2019-04-27 SURGICAL SUPPLY — 48 items
BAG DECANTER FOR FLEXI CONT (MISCELLANEOUS) ×3 IMPLANT
BLADE SAW SGTL 18X1.27X75 (BLADE) ×2 IMPLANT
BLADE SAW SGTL 18X1.27X75MM (BLADE) ×1
BLADE SURG SZ10 CARB STEEL (BLADE) ×6 IMPLANT
CONT SPEC 4OZ CLIKSEAL STRL BL (MISCELLANEOUS) ×3 IMPLANT
COVER PERINEAL POST (MISCELLANEOUS) ×3 IMPLANT
COVER SURGICAL LIGHT HANDLE (MISCELLANEOUS) ×3 IMPLANT
COVER WAND RF STERILE (DRAPES) ×3 IMPLANT
CUP ACETBLR 48 OD 100 SERIES (Hips) ×2 IMPLANT
DECANTER SPIKE VIAL GLASS SM (MISCELLANEOUS) ×6 IMPLANT
DRAPE STERI IOBAN 125X83 (DRAPES) ×3 IMPLANT
DRAPE U-SHAPE 47X51 STRL (DRAPES) ×6 IMPLANT
DRESSING AQUACEL AG SP 3.5X10 (GAUZE/BANDAGES/DRESSINGS) IMPLANT
DRSG AQUACEL AG ADV 3.5X10 (GAUZE/BANDAGES/DRESSINGS) ×3 IMPLANT
DRSG AQUACEL AG SP 3.5X10 (GAUZE/BANDAGES/DRESSINGS) ×3
DURAPREP 26ML APPLICATOR (WOUND CARE) ×3 IMPLANT
ELECT BLADE TIP CTD 4 INCH (ELECTRODE) ×3 IMPLANT
ELECT REM PT RETURN 15FT ADLT (MISCELLANEOUS) ×3 IMPLANT
ELIMINATOR HOLE APEX DEPUY (Hips) ×2 IMPLANT
GLOVE BIO SURGEON STRL SZ7.5 (GLOVE) ×3 IMPLANT
GLOVE BIO SURGEON STRL SZ8.5 (GLOVE) ×3 IMPLANT
GLOVE BIOGEL PI IND STRL 8 (GLOVE) ×1 IMPLANT
GLOVE BIOGEL PI IND STRL 9 (GLOVE) ×1 IMPLANT
GLOVE BIOGEL PI INDICATOR 8 (GLOVE) ×2
GLOVE BIOGEL PI INDICATOR 9 (GLOVE) ×2
GOWN STRL REUS W/TWL XL LVL3 (GOWN DISPOSABLE) ×6 IMPLANT
HEAD FEMORAL 32 CERAMIC (Hips) ×2 IMPLANT
HOLDER FOLEY CATH W/STRAP (MISCELLANEOUS) ×3 IMPLANT
KIT TURNOVER KIT A (KITS) IMPLANT
MANIFOLD NEPTUNE II (INSTRUMENTS) ×3 IMPLANT
NDL HYPO 21X1.5 SAFETY (NEEDLE) ×2 IMPLANT
NEEDLE HYPO 21X1.5 SAFETY (NEEDLE) ×6 IMPLANT
NS IRRIG 1000ML POUR BTL (IV SOLUTION) ×3 IMPLANT
PACK ANTERIOR HIP CUSTOM (KITS) ×3 IMPLANT
PINN ALTRX NEUT ID X OD 32X48 ×2 IMPLANT
STEM FEM ACTIS STD SZ4 (Stem) ×2 IMPLANT
SUT ETHIBOND NAB CT1 #1 30IN (SUTURE) ×3 IMPLANT
SUT VIC AB 0 CT1 27 (SUTURE) ×3
SUT VIC AB 0 CT1 27XBRD ANBCTR (SUTURE) ×1 IMPLANT
SUT VIC AB 1 CTX 36 (SUTURE) ×3
SUT VIC AB 1 CTX36XBRD ANBCTR (SUTURE) ×1 IMPLANT
SUT VIC AB 2-0 CT1 27 (SUTURE) ×3
SUT VIC AB 2-0 CT1 TAPERPNT 27 (SUTURE) ×1 IMPLANT
SUT VIC AB 3-0 CT1 27 (SUTURE) ×3
SUT VIC AB 3-0 CT1 TAPERPNT 27 (SUTURE) ×1 IMPLANT
SYR CONTROL 10ML LL (SYRINGE) ×9 IMPLANT
TRAY FOLEY MTR SLVR 16FR STAT (SET/KITS/TRAYS/PACK) IMPLANT
YANKAUER SUCT BULB TIP 10FT TU (MISCELLANEOUS) ×3 IMPLANT

## 2019-04-27 NOTE — Anesthesia Procedure Notes (Signed)
Procedure Name: MAC Date/Time: 04/27/2019 2:23 PM Performed by: West Pugh, CRNA Pre-anesthesia Checklist: Patient identified, Emergency Drugs available, Suction available, Patient being monitored and Timeout performed Patient Re-evaluated:Patient Re-evaluated prior to induction Oxygen Delivery Method: Simple face mask Preoxygenation: Pre-oxygenation with 100% oxygen Induction Type: IV induction Placement Confirmation: positive ETCO2 Dental Injury: Teeth and Oropharynx as per pre-operative assessment

## 2019-04-27 NOTE — Discharge Instructions (Signed)

## 2019-04-27 NOTE — Transfer of Care (Signed)
Immediate Anesthesia Transfer of Care Note  Patient: Adrienne Campbell  Procedure(s) Performed: Right Anterior Hip Arthroplasty (Right )  Patient Location: PACU  Anesthesia Type:MAC and Spinal  Level of Consciousness: awake, alert , oriented and patient cooperative  Airway & Oxygen Therapy: Patient Spontanous Breathing and Patient connected to face mask oxygen  Post-op Assessment: Report given to RN and Post -op Vital signs reviewed and stable  Post vital signs: Reviewed and stable  Last Vitals:  Vitals Value Taken Time  BP    Temp    Pulse 75 04/27/19 1613  Resp 14 04/27/19 1613  SpO2 100 % 04/27/19 1613  Vitals shown include unvalidated device data.  Last Pain:  Vitals:   04/27/19 1150  TempSrc: Oral         Complications: No apparent anesthesia complications

## 2019-04-27 NOTE — Anesthesia Preprocedure Evaluation (Addendum)
Anesthesia Evaluation  Patient identified by MRN, date of birth, ID band Patient awake    Reviewed: Allergy & Precautions, NPO status , Patient's Chart, lab work & pertinent test results  History of Anesthesia Complications (+) PONV  Airway Mallampati: II  TM Distance: >3 FB Neck ROM: Full    Dental no notable dental hx.    Pulmonary neg pulmonary ROS,    Pulmonary exam normal        Cardiovascular hypertension, Pt. on medications Normal cardiovascular exam     Neuro/Psych Anxiety Depression negative neurological ROS     GI/Hepatic negative GI ROS, Neg liver ROS,   Endo/Other  negative endocrine ROS  Renal/GU negative Renal ROS     Musculoskeletal  (+) Arthritis ,   Abdominal   Peds  Hematology negative hematology ROS (+)   Anesthesia Other Findings Day of surgery medications reviewed with the patient.  Reproductive/Obstetrics                            Anesthesia Physical Anesthesia Plan  ASA: II  Anesthesia Plan: Spinal   Post-op Pain Management:    Induction:   PONV Risk Score and Plan: 4 or greater and Treatment may vary due to age or medical condition, Ondansetron, Propofol infusion, Dexamethasone and Midazolam  Airway Management Planned: Natural Airway and Simple Face Mask  Additional Equipment:   Intra-op Plan:   Post-operative Plan:   Informed Consent: I have reviewed the patients History and Physical, chart, labs and discussed the procedure including the risks, benefits and alternatives for the proposed anesthesia with the patient or authorized representative who has indicated his/her understanding and acceptance.     Dental advisory given  Plan Discussed with: CRNA  Anesthesia Plan Comments:        Anesthesia Quick Evaluation

## 2019-04-27 NOTE — Interval H&P Note (Signed)
History and Physical Interval Note:  04/27/2019 11:53 AM  Adrienne Campbell  has presented today for surgery, with the diagnosis of Right Hip Oesteoarthritis.  The various methods of treatment have been discussed with the patient and family. After consideration of risks, benefits and other options for treatment, the patient has consented to  Procedure(s): Right Anterior Hip Arthroplasty (Right) as a surgical intervention.  The patient's history has been reviewed, patient examined, no change in status, stable for surgery.  I have reviewed the patient's chart and labs.  Questions were answered to the patient's satisfaction.     Kerin Salen

## 2019-04-27 NOTE — Anesthesia Postprocedure Evaluation (Signed)
Anesthesia Post Note  Patient: Adrienne Campbell  Procedure(s) Performed: Right Anterior Hip Arthroplasty (Right )     Patient location during evaluation: PACU Anesthesia Type: Spinal Level of consciousness: awake and alert and oriented Pain management: pain level controlled Vital Signs Assessment: post-procedure vital signs reviewed and stable Respiratory status: spontaneous breathing, nonlabored ventilation and respiratory function stable Cardiovascular status: blood pressure returned to baseline Postop Assessment: no apparent nausea or vomiting and spinal receding Anesthetic complications: no        Brennan Bailey

## 2019-04-27 NOTE — Op Note (Signed)
PATIENT ID:      Adrienne Campbell  MRN:     [id number] DOB/AGE:    [birth year] / 59 y.o.  OPERATIVE REPORT   DATE OF PROCEDURE:  04/27/2019      PREOPERATIVE DIAGNOSIS:  Right Hip Oesteoarthritis                                                         POSTOPERATIVE DIAGNOSIS:  R Hip OA                                                         PROCEDURE: Anterior R total hip arthroplasty using a 48 mm DePuy Pinnacle  Cup, [employer], 0-degree polyethylene liner, a +1 mm x 86mm ceramic head, a 4std Depuy Actis stem  SURGEON: Kerin Salen  ASSISTANT:   Kerry Hough. [employer]  (present throughout entire procedure and necessary for timely completion of the procedure)   ANESTHESIA: Spinal, Exparel 133mg  injection BLOOD LOSS: 300 cc FLUID REPLACEMENT: 1500 cc crystalloid TRANEXAMIC ACID: 1gm IV, 2gm Topical COMPLICATIONS: none    INDICATIONS FOR PROCEDURE: A 58 y.o. year-old With  Right Hip Oesteoarthritis   for 1 years, x-rays show bone-on-bone arthritic changes, and osteophytes. Despite conservative measures with observation, anti-inflammatory medicine, narcotics, use of a cane, has severe unremitting pain and can ambulate only a few blocks before resting. Patient desires elective R total hip arthroplasty to decrease pain and increase function. The risks, benefits, and alternatives were discussed at length including but not limited to the risks of infection, bleeding, nerve injury, stiffness, blood clots, the need for revision surgery, cardiopulmonary complications, among others, and they were willing to proceed. Questions answered      PROCEDURE IN DETAIL: The patient was identified by armband,   received preoperative IV antibiotics in the holding area at [facility], taken to the operating room , appropriate anesthetic monitors   were attached and anesthesia was induced with the patient on the gurney. HANA boots were applied to the feet, and the patient  was transferred to  the HANA table with a peroneal post and support underneath the non-operative leg. Theoperative lower extremity was then prepped and draped in the usual sterile fashion from just above the iliac crest to the knee. And a timeout procedure was performed. Skin along incision area was injected with 10 cc of Exparel solution. We then made a 12 cm incision along the interval at the leading edge of the tensor fascia lata of starting at 2 cm lateral to the ASIS. Small bleeders in the skin and subcutaneous tissue identified and cauterized we dissected down to the fascia and made an incision in the fascia allowing [place] to elevate the fascia of the tensor muscle and exploited the interval between the rectus and the tensor fascia lata. A Cobra retractor was then placed along the superior neck of the femur. A cerebellar retractor was used to expose the interval between the tensor fascia lata and the rectus femoris.  We identified and cauterized the ascending branch of the anterior circumflex artery. A second Cobra retractor along the inferior neck of  the femur. A small Hohmann retractor was placed underneath the origin of the rectus femoris, giving [place] good medial exposure. Using Ronguers fatty tissue was removed from in front of the anterior capsule. The capsule was then incised, starting out at the superior anterior rim of the acetabulum going laterally along the anterior neck. The capsule was then teed along the neck superiorly and inferiorly. Electrocautery was used to release capsule from the anterior and medial neck of the femur to allow external rotation. Cobra retractors were then placed along the inferior and superior neck allowing [place] to perform a standard neck cut and removed the femoral head with a power corkscrew. We then placed a medium bent homan retractor in the cotyloid notch and posteriorly along the acetabular rim a narrow Cobra retractor. Exposed labral tissue and osteophytes were then removed. We then  sequentially reamed up to a 47 mm basket reamer obtaining good coverage in all quadrants, verified by C-arm imaging. Under C-arm control we then hammered into place a 48 mm Pinnacle cup in 45 of abduction and 15 of anteversion. The cup seated nicely and required no supplemental screws. We then placed a central hole Eliminator and a 0 polyethylene liner. The foot was then externally rotated to 130-140. The limb was extended and adducted to the floor, delivering the proximal femur up into the wound. A medium curved Hohmann retractor was placed over the greater trochanter and a long Homan retractor along the posterior femoral neck completing the exposure and lateralizing the femur. We then performed releases superiorly and and inferiorly of the capsule going back to the pirformis fossa superiorly and to the lesser trochanter inferiorly. We then entered the proximal femur with the box cutting offset chisel followed by, a canal sounder, the chili pepper and broaching up to a 4 broach. This seated nicely and we reamed the calcar. A trial reduction was performed with a 1 mm X 32 mm head.The limb lengths were excellent the hip was stable in 90 of external rotation. At this point the trial components removed and we hammered into place a # 4std  Offset Actis stem with G[employer] coating. A + 1 mm x 36 ceramic head was then hammered into place. The hip was reduced and final C-arm images obtained. The wound was thoroughly irrigated with normal saline solution. We repaired the ant capsule and the tensor fascia lot a with running 0 vicryl suture. the subcutaneous tissue was closed with 2-0 and 3-0 Vicryl suture followed by an Aquacil dressing. At this point the patient was awaken and transferred to hospital gurney without difficulty.   Kerin Salen 04/27/2019, 6:37 AM

## 2019-04-27 NOTE — Anesthesia Procedure Notes (Signed)
Spinal  Patient location during procedure: OR Start time: 04/27/2019 2:29 PM End time: 04/27/2019 2:31 PM Staffing Anesthesiologist: Brennan Bailey, MD Performed: anesthesiologist  Preanesthetic Checklist Completed: patient identified, surgical consent, pre-op evaluation, timeout performed, IV checked, risks and benefits discussed and monitors and equipment checked Spinal Block Patient position: sitting Prep: site prepped and draped and DuraPrep Patient monitoring: cardiac monitor, continuous pulse ox and blood pressure Approach: midline Location: L3-4 Injection technique: single-shot Needle Needle type: Pencan  Needle gauge: 24 G Needle length: 9 cm Additional Notes Risks, benefits, and alternative discussed. Patient gave consent to procedure. Prepped and draped in sitting position. Clear CSF obtained after one needle redirection. Positive terminal aspiration. No pain or paraesthesias with injection. Patient tolerated procedure well. Vital signs stable. Tawny Asal, MD

## 2019-04-28 ENCOUNTER — Encounter (HOSPITAL_COMMUNITY): Payer: Self-pay | Admitting: Orthopedic Surgery

## 2019-04-28 DIAGNOSIS — M1611 Unilateral primary osteoarthritis, right hip: Secondary | ICD-10-CM | POA: Diagnosis not present

## 2019-04-28 LAB — BASIC METABOLIC PANEL
Anion gap: 10 (ref 5–15)
BUN: 7 mg/dL (ref 6–20)
CO2: 23 mmol/L (ref 22–32)
Calcium: 8.7 mg/dL — ABNORMAL LOW (ref 8.9–10.3)
Chloride: 102 mmol/L (ref 98–111)
Creatinine, Ser: 0.66 mg/dL (ref 0.44–1.00)
GFR calc Af Amer: >60 mL/min (ref >60)
GFR calc non Af Amer: >60 mL/min (ref >60)
Glucose, Bld: 229 mg/dL — ABNORMAL HIGH (ref 70–99)
Potassium: 4.1 mmol/L (ref 3.5–5.1)
Sodium: 135 mmol/L (ref 135–145)

## 2019-04-28 LAB — CBC
HCT: 35.6 % — ABNORMAL LOW (ref 36.0–46.0)
Hemoglobin: 11.9 g/dL — ABNORMAL LOW (ref 12.0–15.0)
MCH: 34.6 pg — ABNORMAL HIGH (ref 26.0–34.0)
MCHC: 33.4 g/dL (ref 30.0–36.0)
MCV: 103.5 fL — ABNORMAL HIGH (ref 80.0–100.0)
Platelets: 287 10*3/uL (ref 150–400)
RBC: 3.44 MIL/uL — ABNORMAL LOW (ref 3.87–5.11)
RDW: 11.5 % (ref 11.5–15.5)
WBC: 14.5 10*3/uL — ABNORMAL HIGH (ref 4.0–10.5)
nRBC: 0 % (ref 0.0–0.2)

## 2019-04-28 NOTE — Progress Notes (Signed)
PATIENT ID: Adrienne Campbell  MRN: [id number]  DOB/AGE:  [birth year] / 58 y.o.  1 Day Post-Op Procedure(s) (LRB): Right Anterior Hip Arthroplasty (Right)    PROGRESS NOTE Subjective: Patient is alert, oriented, no Nausea, no Vomiting, yes passing gas, . Taking PO well. Denies SOB, Chest or Calf Pain. Using Incentive Spirometer, PAS in place. Ambulate in room Patient reports pain as  2/10  .    Objective: Vital signs in last 24 hours: Vitals:   04/27/19 1939 04/27/19 2025 04/28/19 0029 04/28/19 0431  BP: (Abnormal) 150/79 (Abnormal) 152/79 123/70 (Abnormal) 118/55  Pulse: 82 86 87 80  Resp: 16 16 16 18   Temp: (Abnormal) 97.5 F (36.4 C) 98 F (36.7 C) 98 F (36.7 C) 97.7 F (36.5 C)  TempSrc: Oral  Oral Oral  SpO2: 100% 100% 98% 96%  Weight:      Height:          Intake/Output from previous day: I/O last 3 completed shifts: In: [id number] [P.O.:1080; I.V.:2013; IV Piggyback:300] Out: [id number] [Urine:3600; Blood:225]   Intake/Output this shift: No intake/output data recorded.   LABORATORY DATA: Recent Labs    04/27/19 1408 04/28/19 0329  WBC  --  14.5*  HGB  --  11.9*  HCT  --  35.6*  PLT  --  287  NA  --  135  K  --  4.1  CL  --  102  CO2  --  23  BUN  --  7  CREATININE  --  0.66  GLUCOSE  --  229*  GLUCAP 65*  --   CALCIUM  --  8.7*    Examination: Neurologically intact ABD soft Neurovascular intact Sensation intact distally Intact pulses distally Dorsiflexion/Plantar flexion intact Incision: dressing C/D/I No cellulitis present Compartment soft} XR AP&Lat of hip shows well placed\fixed THA  Assessment:   1 Day Post-Op Procedure(s) (LRB): Right Anterior Hip Arthroplasty (Right) ADDITIONAL DIAGNOSIS:  Expected Acute Blood Loss Anemia  Patient's anticipated LOS is less than 2 midnights, meeting these requirements: - Younger than 56 - Lives within 1 hour of care - Has a competent adult at home to recover with post-op recover - NO history of  -  Chronic pain requiring opiods  - Diabetes  - Coronary Artery Disease  - Heart failure  - Heart attack  - Stroke  - DVT/VTE  - Cardiac arrhythmia  - Respiratory Failure/COPD  - Renal failure  - Anemia  - Advanced Liver disease       Plan: PT/OT WBAT, THA  DVT Prophylaxis: SCDx72 hrs, ASA 81 mg BID x 2 weeks  DISCHARGE PLAN: Home, today when passes PT  DISCHARGE NEEDS: HHPT, Walker and 3-in-1 comode seat

## 2019-04-28 NOTE — Evaluation (Signed)
Physical Therapy Evaluation Patient Details Name: Adrienne Campbell MRN: [id number] DOB: 1960/12/17 Today's Date: 04/28/2019   History of Present Illness  58 yo female s/p R THA-direct anterior 8/24. Hx of L4-L5 TLIF 11/2018, R TKA, L THA  Clinical Impression  On eval, pt was Supv-Mod Ind with mobility. She walked ~120 feet with a RW. Reviewed/practiced exercises, gait training, and stair training. All education completed. Okay to d/c from PT standpoint.     Follow Up Recommendations Follow surgeon's recommendation for DC plan and follow-up therapies    Equipment Recommendations  None recommended by PT    Recommendations for Other Services       Precautions / Restrictions Precautions Precautions: Fall Restrictions Weight Bearing Restrictions: No Other Position/Activity Restrictions: WBAT      Mobility  Bed Mobility Overal bed mobility: Modified Independent             General bed mobility comments: Increased time. Pt used UEs to assist R LE off bed.  Transfers Overall transfer level: Needs assistance Equipment used: Rolling walker (2 wheeled) Transfers: Sit to/from Stand Sit to Stand: Supervision         General transfer comment: VCs safety, hand placement.  Ambulation/Gait Ambulation/Gait assistance: Supervision Gait Distance (Feet): 120 Feet Assistive device: Rolling walker (2 wheeled) Gait Pattern/deviations: Step-through pattern;Decreased stride length     General Gait Details: for safety.  Stairs Stairs: Yes Stairs assistance: Min assist Stair Management: Step to pattern;Backwards;One rail Right;With cane Number of Stairs: 5 General stair comments: up and over portable stairs x 2. Once to simulate negotiating stairs to get to BR (1 rail/cane) and once to simulate entry into home (no rails, 1 HHA/cane). VCs safety, technique, sequence.  Wheelchair Mobility    Modified Rankin (Stroke Patients Only)       Balance Overall balance  assessment: Mild deficits observed, not formally tested                                           Pertinent Vitals/Pain Pain Assessment: 0-10 Pain Score: 5  Pain Location: R hip Pain Descriptors / Indicators: Sore Pain Intervention(s): Limited activity within patient's tolerance;Repositioned;Ice applied    Home Living Family/patient expects to be discharged to:: Private residence Living Arrangements: Spouse/significant other;Children Available Help at Discharge: Family;Available 24 hours/day Type of Home: House Home Access: Stairs to enter Entrance Stairs-Rails: Right   Home Layout: Two level Home Equipment: Walker - 2 wheels;Cane - single point;Bedside commode;Tub bench;Adaptive equipment      Prior Function Level of Independence: Independent               Hand Dominance        Extremity/Trunk Assessment   Upper Extremity Assessment Upper Extremity Assessment: Overall WFL for tasks assessed    Lower Extremity Assessment Lower Extremity Assessment: (post op weakness 2* THA)    Cervical / Trunk Assessment Cervical / Trunk Assessment: Normal  Communication   Communication: No difficulties  Cognition Arousal/Alertness: Awake/alert Behavior During Therapy: WFL for tasks assessed/performed Overall Cognitive Status: Within Functional Limits for tasks assessed                                        General Comments      Exercises Total Joint Exercises Ankle Circles/Pumps: AROM;Both;10 reps;Seated [employer]  Sets: AROM;Both;10 reps;Seated Heel Slides: AROM;AAROM;Right;10 reps;Seated Hip ABduction/ADduction: AROM;Right;10 reps;Seated Long Arc Quad: AROM;Right;10 reps;Seated   Assessment/Plan    PT Assessment Patient needs continued PT services  PT Problem List Decreased strength;Decreased range of motion;Decreased balance;Decreased knowledge of use of DME;Pain;Decreased activity tolerance;Decreased mobility       PT  Treatment Interventions DME instruction;Gait training;Therapeutic activities;Therapeutic exercise;Patient/family education;Balance training;Functional mobility training;Stair training    PT Goals (Current goals can be found in the Care Plan section)  Acute Rehab PT Goals Patient Stated Goal: regain independence/PLOF PT Goal Formulation: All assessment and education complete, DC therapy    Frequency 7X/week   Barriers to discharge        Co-evaluation               AM-PAC PT "6 Clicks" Mobility  Outcome Measure Help needed turning from your back to your side while in a flat bed without using bedrails?: A Little Help needed moving from lying on your back to sitting on the side of a flat bed without using bedrails?: A Little Help needed moving to and from a bed to a chair (including a wheelchair)?: A Little Help needed standing up from a chair using your arms (e.g., wheelchair or bedside chair)?: A Little Help needed to walk in hospital room?: A Little Help needed climbing 3-5 steps with a railing? : A Little 6 Click Score: 18    End of Session Equipment Utilized During Treatment: Gait belt Activity Tolerance: Patient tolerated treatment well Patient left: in chair;with call bell/phone within reach   PT Visit Diagnosis: Pain;Unsteadiness on feet (R26.81) Pain - Right/Left: Right Pain - part of body: Hip    Time: 1001-1019 PT Time Calculation (min) (ACUTE ONLY): 18 min   Charges:   PT Evaluation $PT Eval Low Complexity: [place], PT Acute Rehabilitation Services Pager: [phone] Office: [phone]

## 2019-04-28 NOTE — TOC Progression Note (Signed)
Transition of Care [facility]) - Progression Note    Patient Details  Name: Adrienne Campbell MRN: [id number] Date of Birth: 1961-08-16  Transition of [facility] CM/SW [place], [place] Phone Number: 04/28/2019, 10:20 AM  Clinical Narrative:    Therapy Plans: HHPT[facility] Has DME RW and 3 in 1    Expected Discharge Plan: [place] Barriers to Discharge: No Barriers Identified  Expected Discharge Plan and Services Expected Discharge Plan: [place]         Expected Discharge Date: 04/28/19                         HH Arranged: PT [place] Agency: [place] ([place]) Date [place]: 04/28/19 Time [place]: [phone] Representative spoke with at [place]: [place] ([place]) Interventions    Readmission Risk Interventions No flowsheet data found.

## 2019-04-28 NOTE — Plan of Care (Signed)
Patient being discharged

## 2019-04-28 NOTE — Discharge Summary (Signed)
Patient ID: Adrienne Campbell MRN: [id number] DOB/AGE: 1961-04-11 58 y.o.  Admit date: 04/27/2019 Discharge date: 04/28/2019  Admission Diagnoses:  Principal Problem:   Osteoarthritis of right hip Active Problems:   S/P hip replacement, right   Discharge Diagnoses:  Same  Past Medical History:  Diagnosis Date  . Anemia   . Anxiety   . Arthritis   . Depression   . Environmental and seasonal allergies   . Heart murmur    during pregnancy  . Hyperlipidemia   . Hypertension   . Insomnia   . Pneumonia    hx of  . PONV (postoperative nausea and vomiting)    Nausea, per pt. "felt loopy after hip surgery"   and made hallucinate    Surgeries: Procedure(s): Right Anterior Hip Arthroplasty on 04/27/2019   Consultants:   Discharged Condition: Improved  Hospital Course: Adrienne Campbell is an 58 y.o. female who was admitted 04/27/2019 for operative treatment ofOsteoarthritis of right hip. Patient has severe unremitting pain that affects sleep, daily activities, and work/hobbies. After pre-op clearance the patient was taken to the operating room on 04/27/2019 and underwent  Procedure(s): Right Anterior Hip Arthroplasty.    Patient was given perioperative antibiotics:  Anti-infectives (From admission, onward)   Start     Dose/Rate Route Frequency Ordered Stop   04/27/19 2200  minocycline (MINOCIN) capsule 50 mg     50 mg Oral 2 times daily 04/27/19 1729     04/27/19 1729  acyclovir (ZOVIRAX) 200 MG capsule 400 mg     400 mg Oral 3 times daily PRN 04/27/19 1729     04/27/19 1200  ceFAZolin (ANCEF) IVPB 2g/100 mL premix     2 g 200 mL/hr over 30 Minutes Intravenous On call to O.R. 04/27/19 1149 04/27/19 1502       Patient was given sequential compression devices, early ambulation, and chemoprophylaxis to prevent DVT.  Patient benefited maximally from hospital stay and there were no complications.    Recent vital signs:  Patient Vitals for the past 24 hrs:  BP Temp  Temp src Pulse Resp SpO2 Height Weight  04/28/19 0431 (Abnormal) 118/55 97.7 F (36.5 C) Oral 80 18 96 % no documentation no documentation  04/28/19 0029 123/70 98 F (36.7 C) Oral 87 16 98 % no documentation no documentation  04/27/19 2025 (Abnormal) 152/79 98 F (36.7 C) no documentation 86 16 100 % no documentation no documentation  04/27/19 1939 (Abnormal) 150/79 (Abnormal) 97.5 F (36.4 C) Oral 82 16 100 % no documentation no documentation  04/27/19 1840 (Abnormal) 154/98 (Abnormal) 97.4 F (36.3 C) Oral 85 16 100 % no documentation no documentation  04/27/19 1725 (Abnormal) 151/86 no documentation no documentation 78 20 100 % no documentation no documentation  04/27/19 1710 (Abnormal) 159/80 no documentation no documentation 71 12 100 % no documentation no documentation  04/27/19 1700 (Abnormal) 153/79 no documentation no documentation 73 12 100 % no documentation no documentation  04/27/19 1645 (Abnormal) 144/86 no documentation no documentation 80 15 100 % no documentation no documentation  04/27/19 1630 123/72 no documentation no documentation 74 14 100 % no documentation no documentation  04/27/19 1615 112/61 no documentation no documentation 73 15 100 % no documentation no documentation  04/27/19 1612 108/60 (Abnormal) 97.5 F (36.4 C) no documentation no documentation 17 100 % no documentation no documentation  04/27/19 1208 no documentation no documentation no documentation no documentation no documentation no documentation 5\' 4"  (1.626 m) 63.5 kg  04/27/19 1150 (Abnormal)  144/95 97.9 F (36.6 C) Oral 88 16 100 % no documentation no documentation     Recent laboratory studies:  Recent Labs    04/28/19 0329  WBC 14.5*  HGB 11.9*  HCT 35.6*  PLT 287  NA 135  K 4.1  CL 102  CO2 23  BUN 7  CREATININE 0.66  GLUCOSE 229*  CALCIUM 8.7*     Discharge Medications:   Allergies as of 04/28/2019    Allergen Reactions Comment   Nsaids Hives Specifically meloxicam and  ibuprofen reported   Onion Swelling SWELLING REACTION UNSPECIFIED    Sulfa Antibiotics Swelling Patient reports tongue swells   Fluticasone Other (See Comments) ulcers   Oxycodone Nausea Only Severe nausea; pt has to take with Zofran   Tramadol Hives    Hydrocodone-acetaminophen Other (See Comments) Hallucinations   Codeine Nausea And Vomiting    Gluten Meal Diarrhea       Medication List    Stop taking these medications   acetaminophen 650 MG CR tablet Commonly known as: TYLENOL   cyclobenzaprine 10 MG tablet Commonly known as: FLEXERIL     Take these medications   acyclovir 200 MG capsule Commonly known as: ZOVIRAX Take 400 mg by mouth 3 (three) times daily as needed (cold sores).   aspirin EC 81 MG tablet Take 1 tablet (81 mg total) by mouth 2 (two) times daily.   celecoxib 200 MG capsule Commonly known as: CELEBREX Take 200 mg by mouth 2 (two) times daily.   cholecalciferol 25 MCG (1000 UT) tablet Commonly known as: VITAMIN D Take 1,000 Units by mouth daily.   CITRUS BERGAMOT PO Take 1 tablet by mouth every morning.   Crestor 5 MG tablet Generic drug: rosuvastatin Take 5 mg by mouth every morning.   Dapsone 5 % topical gel Apply 1 application topically daily as needed.   Effexor XR 150 MG 24 hr capsule Generic drug: venlafaxine XR Take 150 mg by mouth daily with breakfast.   Humira 40 MG/0.8ML Pskt Generic drug: Adalimumab Inject 0.8 mLs (40 mg total) into the skin once a week.   hydrochlorothiazide 25 MG tablet Commonly known as: HYDRODIURIL Take 25 mg by mouth daily.   hydrOXYzine 25 MG capsule Commonly known as: VISTARIL Take 25 mg by mouth at bedtime.   Lubricant Eye Drops 0.4-0.3 % Soln Generic drug: Polyethyl Glycol-Propyl Glycol Place 1 drop into both eyes 4 (four) times daily as needed (dry/irritated eyes).   minocycline 50 MG capsule Commonly known as: MINOCIN Take 50 mg by mouth 2 (two) times daily.   Omega-3 1000 MG Caps Take  1,000 mg by mouth at bedtime.   ondansetron 4 MG tablet Commonly known as: Zofran Take 1 tablet (4 mg total) by mouth daily as needed for nausea or vomiting.   oxyCODONE-acetaminophen 5-325 MG tablet Commonly known as: PERCOCET/ROXICET Take 1 tablet by mouth every 4 (four) hours as needed for severe pain.   tiZANidine 2 MG tablet Commonly known as: ZANAFLEX Take 1 tablet (2 mg total) by mouth every 6 (six) hours as needed.   Vagifem 10 MCG Tabs vaginal tablet Generic drug: Estradiol Place 10 mcg vaginally 2 (two) times a week.   Vitamin B-12 5000 MCG Tbdp Take 5,000 mcg by mouth daily.   vitamin E 400 UNIT capsule Take 400 Units by mouth at bedtime.        Durable Medical Equipment  (From admission, onward)         Start  Ordered   04/27/19 1730  DME Walker rolling  Once    Question:  Patient needs a walker to treat with the following condition  Answer:  Status post right hip replacement   04/27/19 1729   04/27/19 1730  DME 3 n 1  Once     04/27/19 1729           Discharge Care Instructions  (From admission, onward)         Start     Ordered   04/28/19 0000  Change dressing    Comments: Change dressing Only if drainage exceeds 40% of window on dressing   04/28/19 0751          Diagnostic Studies: Dg Chest 2 View  Result Date: 04/23/2019 CLINICAL DATA:  Preoperative chest radiograph prior to hip surgery. EXAM: CHEST - 2 VIEW COMPARISON:  06/18/2014 FINDINGS: The cardiomediastinal silhouette is unremarkable. There is no evidence of focal airspace disease, pulmonary edema, suspicious pulmonary nodule/mass, pleural effusion, or pneumothorax. No acute bony abnormalities are identified. IMPRESSION: No active cardiopulmonary disease. Electronically Signed   By: Margarette Canada M.D.   On: 04/23/2019 17:45   Dg C-arm 1-60 Min-no Report  Result Date: 04/27/2019 Fluoroscopy was utilized by the requesting physician.  No radiographic interpretation.   Dg Hip  Operative Unilat With Pelvis Right  Result Date: 04/27/2019 CLINICAL DATA:  Intraoperative imaging for right hip replacement. EXAM: OPERATIVE RIGHT HIP (WITH PELVIS IF PERFORMED) 2 VIEWS TECHNIQUE: Fluoroscopic spot image(s) were submitted for interpretation post-operatively. COMPARISON:  None. FINDINGS: Fluoroscopic spot views of the right hip and low pelvis are provided. Right total hip arthroplasty is in place. No acute finding. IMPRESSION: Intraoperative imaging for right hip replacement. Electronically Signed   By: Inge Rise M.D.   On: 04/27/2019 16:12    Disposition: Discharge disposition: 01-Home or Self Care       Discharge Instructions    Call MD / Call 911   Complete by: As directed    If you experience chest pain or shortness of breath, CALL 911 and be transported to the hospital emergency room.  If you develope a fever above 101 F, pus (white drainage) or increased drainage or redness at the wound, or calf pain, call your surgeon's office.   Change dressing   Complete by: As directed    Change dressing Only if drainage exceeds 40% of window on dressing   Constipation Prevention   Complete by: As directed    Drink plenty of fluids.  Prune juice may be helpful.  You may use a stool softener, such as Colace (over the counter) 100 mg twice a day.  Use MiraLax (over the counter) for constipation as needed.   Diet - low sodium heart healthy   Complete by: As directed    Increase activity slowly as tolerated   Complete by: As directed       Follow-up Information    Frederik Pear, MD In 2 weeks.   Specialty: Orthopedic Surgery Contact information: [place] [address] [phone]            Signed: Kerin Salen 04/28/2019, 7:51 AM

## 2019-05-13 MED FILL — HYDROXYZINE PAMOATE 25 MG C: 25 | 90 days supply | Qty: 90 | Fill #3

## 2019-05-13 MED FILL — VENLAFAXINE HCL ER 150 MG C: 150 | 30 days supply | Qty: 30 | Fill #8

## 2019-05-13 MED FILL — HYDROCHLOROTHIAZIDE 25 MG T: 25 | 30 days supply | Qty: 30 | Fill #1

## 2019-05-15 MED FILL — MINOCYCLINE 50 MG CAPSULE: 50 | 30 days supply | Qty: 60 | Fill #1

## 2019-05-15 MED FILL — CYCLOBENZAPRINE HCL 10 MG T: 10 | 30 days supply | Qty: 30 | Fill #1

## 2019-06-08 MED FILL — VENLAFAXINE HCL ER 150 MG C: 150 | 30 days supply | Qty: 30 | Fill #9

## 2019-06-08 MED FILL — MINOCYCLINE 50 MG CAPSULE: 50 | 30 days supply | Qty: 60 | Fill #2

## 2019-06-08 MED FILL — ROSUVASTATIN CALCIUM 5 MG T: 5 | 90 days supply | Qty: 90 | Fill #5

## 2019-06-08 MED FILL — HYDROCHLOROTHIAZIDE 25 MG T: 25 | 30 days supply | Qty: 30 | Fill #2

## 2019-06-08 MED FILL — ESTRADIOL 10 MCG TABS: 10 | 84 days supply | Qty: 24 | Fill #2

## 2019-06-08 MED FILL — CYCLOBENZAPRINE HCL 10 MG T: 10 | 30 days supply | Qty: 30 | Fill #2

## 2019-06-18 MED FILL — HUMIRA 40 MG/0.8ML PSKT: 40 | 28 days supply | Qty: 4 | Fill #2

## 2019-07-06 MED FILL — CELECOXIB 200 MG CAP: 200 | 90 days supply | Qty: 180 | Fill #0

## 2019-07-06 MED FILL — CYCLOBENZAPRINE HCL 10 MG T: 10 | 30 days supply | Qty: 30 | Fill #0

## 2019-07-06 MED FILL — HYDROCHLOROTHIAZIDE 25 MG T: 25 | 90 days supply | Qty: 90 | Fill #0

## 2019-07-06 MED FILL — VENLAFAXINE HCL ER 150 MG C: 150 | 30 days supply | Qty: 30 | Fill #10

## 2019-07-09 MED FILL — HUMIRA 40 MG/0.8ML PSKT: 40 | 28 days supply | Qty: 4 | Fill #3

## 2019-07-23 ENCOUNTER — Other Ambulatory Visit: Payer: Self-pay | Admitting: Family Medicine

## 2019-07-23 DIAGNOSIS — Z1231 Encounter for screening mammogram for malignant neoplasm of breast: Secondary | ICD-10-CM

## 2019-07-27 ENCOUNTER — Ambulatory Visit
Admission: RE | Admit: 2019-07-27 | Discharge: 2019-07-27 | Disposition: A | Discharge status: Home / Self Care | Payer: No Typology Code available for payment source | Source: Ambulatory Visit | Attending: Family Medicine | Admitting: Family Medicine

## 2019-07-27 ENCOUNTER — Other Ambulatory Visit: Payer: Self-pay

## 2019-07-27 DIAGNOSIS — Z1231 Encounter for screening mammogram for malignant neoplasm of breast: Secondary | ICD-10-CM

## 2019-07-29 ENCOUNTER — Other Ambulatory Visit: Payer: Self-pay | Admitting: Family Medicine

## 2019-07-29 DIAGNOSIS — R928 Other abnormal and inconclusive findings on diagnostic imaging of breast: Secondary | ICD-10-CM

## 2019-07-31 MED FILL — HYDROXYZINE PAMOATE 25 MG C: 25 | 90 days supply | Qty: 90 | Fill #0

## 2019-08-05 MED FILL — HUMIRA 40 MG/0.8ML PSKT: 40 | 28 days supply | Qty: 4 | Fill #4

## 2019-08-05 MED FILL — VENLAFAXINE HCL ER 150 MG C: 150 | 30 days supply | Qty: 30 | Fill #11

## 2019-08-07 MED FILL — LISINOPRIL-HCTZ 10-12.5 MG: 10-12.5 | 90 days supply | Qty: 90 | Fill #0

## 2019-08-12 ENCOUNTER — Ambulatory Visit
Admission: RE | Admit: 2019-08-12 | Discharge: 2019-08-12 | Disposition: A | Discharge status: Home / Self Care | Payer: No Typology Code available for payment source | Source: Ambulatory Visit | Attending: Family Medicine | Admitting: Family Medicine

## 2019-08-12 ENCOUNTER — Other Ambulatory Visit: Payer: Self-pay

## 2019-08-12 DIAGNOSIS — R928 Other abnormal and inconclusive findings on diagnostic imaging of breast: Secondary | ICD-10-CM

## 2019-08-17 ENCOUNTER — Other Ambulatory Visit: Payer: Self-pay

## 2019-08-17 ENCOUNTER — Ambulatory Visit: Payer: No Typology Code available for payment source | Admitting: Pharmacist

## 2019-08-17 DIAGNOSIS — Z79899 Other long term (current) drug therapy: Secondary | ICD-10-CM

## 2019-08-17 NOTE — Progress Notes (Signed)
Virtual Visit via Telephone Note Due to current restrictions/limitations of in-office visits due to the COVID-19 pandemic, this scheduled clinical appointment was converted to a telehealth visit  I connected with [name] on 12/14/20at11:50amby telephone.Verified that I was speaking to her using two patient identifiers.  I am in my office. The patient is not in clinic.Only the patient and myself participated in this encounter.  S: Patient presents for review of their specialty medication therapy.  Patient is currently taking Humira for ankylosing spondylitis. Patient is managed by Marella Chimes for this.   Adherence: denies any missed doses  Efficacy: working well for her  Dosing:  Ankylosing spondylitis: SubQ: 40 mg every week  Dose adjustments: Renal: no dose adjustments (has not been studied) Hepatic: no dose adjustments (has not been studied)  Screening: TB test: completed per patient; negative Hepatitis: completed per patient  Monitoring: S/sx of infection: denies CBC: followed by rheum, has been WNL S/sx of hypersensitivity: denies S/sx of malignancy: denies S/sx of heart failure: denies Recently underwent testing for Ab, and this was negative  O:      Lab Results  Component Value Date   WBC 14.5 (H) 04/28/2019   HGB 11.9 (L) 04/28/2019   HCT 35.6 (L) 04/28/2019   MCV 103.5 (H) 04/28/2019   PLT 287 04/28/2019      Chemistry      Component Value Date/Time   NA 135 04/28/2019 0329   K 4.1 04/28/2019 0329   CL 102 04/28/2019 0329   CO2 23 04/28/2019 0329   BUN 7 04/28/2019 0329   CREATININE 0.66 04/28/2019 0329      Component Value Date/Time   CALCIUM 8.7 (L) 04/28/2019 0329   ALKPHOS 70 11/07/2018 1329   AST 21 11/07/2018 1329   ALT 15 11/07/2018 1329   BILITOT 0.5 11/07/2018 1329     A/P: 1. Medication review: Patient currently on Humira for ankylosing spondylitis. Reviewed the medication with the patient, including the  following: Humira is a TNF blocking agent indicated for ankylosing spondylitis, Crohn's disease, Hidradenitis suppurativa, psoriatic arthritis, plaque psoriasis, ulcerative colitis, and uveitis. Patient educated on purpose, proper use and potential adverse effects of Humira. The most common adverse effects are infections, headache, and injection site reactions. There is the possibility of an increased risk of malignancy but it is not well understood if this increased risk is due to there medication or the disease state. There are rare cases of pancytopenia and aplastic anemia. No recommendations for any changes at this time.  Benard Halsted, PharmD, [place] [phone]

## 2019-08-21 MED FILL — CYCLOBENZAPRINE HCL 10 MG T: 10 | 30 days supply | Qty: 30 | Fill #0

## 2019-09-02 MED FILL — ESTRADIOL 10 MCG TABS: 10 | 84 days supply | Qty: 24 | Fill #3

## 2019-09-02 MED FILL — ROSUVASTATIN CALCIUM 5 MG T: 5 | 90 days supply | Qty: 90 | Fill #0

## 2019-09-02 MED FILL — HUMIRA 40 MG/0.8ML PSKT: 40 | 28 days supply | Qty: 4 | Fill #5

## 2019-09-02 MED FILL — VENLAFAXINE HCL ER 150 MG C: 150 | 90 days supply | Qty: 90 | Fill #0

## 2019-09-15 MED FILL — CYCLOBENZAPRINE HCL 10 MG T: 10 | 30 days supply | Qty: 30 | Fill #0

## 2019-10-12 ENCOUNTER — Other Ambulatory Visit: Payer: Self-pay | Admitting: Pharmacist

## 2019-10-12 MED ORDER — HUMIRA (2 SYRINGE) 40 MG/0.4ML ~~LOC~~ PSKT: 40.0000 mg | PREFILLED_SYRINGE | SUBCUTANEOUS | 3 refills | Status: DC

## 2019-10-12 MED FILL — HUMIRA CF 40 MG/0.4ML PSKT: 40 | 28 days supply | Qty: 4 | Fill #0

## 2019-10-12 MED FILL — CYCLOBENZAPRINE HCL 10 MG T: 10 | 30 days supply | Qty: 30 | Fill #0

## 2019-10-12 MED FILL — CELECOXIB 200 MG CAP: 200 | 90 days supply | Qty: 180 | Fill #0

## 2019-10-22 MED FILL — HYDROXYZINE PAMOATE 25 MG C: 25 | 90 days supply | Qty: 90 | Fill #0

## 2019-10-28 MED FILL — LISINOPRIL-HCTZ 10-12.5 MG: 10-12.5 | 90 days supply | Qty: 90 | Fill #1

## 2019-11-09 MED FILL — HUMIRA CF 40 MG/0.4ML PSKT: 40 | 28 days supply | Qty: 4 | Fill #1

## 2019-11-10 MED FILL — CYCLOBENZAPRINE HCL 10 MG T: 10 | 30 days supply | Qty: 30 | Fill #0

## 2019-11-20 MED FILL — HYDROCODON-APAP 5-325: 5-325 | 2 days supply | Qty: 12 | Fill #0

## 2019-11-20 MED FILL — CLINDAMYCIN HCL 300 MG CAPS: 300 | 7 days supply | Qty: 21 | Fill #0

## 2019-12-04 MED FILL — HUMIRA CF 40 MG/0.4ML PSKT: 40 | 28 days supply | Qty: 4 | Fill #2

## 2019-12-05 MED FILL — VENLAFAXINE HCL ER 150 MG C: 150 | 90 days supply | Qty: 90 | Fill #1

## 2019-12-05 MED FILL — ROSUVASTATIN CALCIUM 5 MG T: 5 | 90 days supply | Qty: 90 | Fill #1

## 2019-12-07 MED FILL — CYCLOBENZAPRINE HCL 10 MG T: 10 | 30 days supply | Qty: 30 | Fill #0

## 2019-12-07 MED FILL — ESTRADIOL 10 MCG TABS: 10 | 84 days supply | Qty: 24 | Fill #0

## 2020-01-18 MED FILL — LISINOPRIL-HCTZ 10-12.5 MG: 10-12.5 | 90 days supply | Qty: 90 | Fill #2

## 2020-01-18 MED FILL — HYDROXYZINE PAMOATE 25 MG C: 25 | 90 days supply | Qty: 90 | Fill #1

## 2020-01-21 MED FILL — CELECOXIB 200 MG CAP: 200 | 90 days supply | Qty: 180 | Fill #0

## 2020-01-21 MED FILL — CYCLOBENZAPRINE HCL 10 MG T: 10 | 30 days supply | Qty: 30 | Fill #0

## 2020-01-26 ENCOUNTER — Other Ambulatory Visit: Payer: Self-pay | Admitting: Pharmacist

## 2020-01-26 MED ORDER — HUMIRA (2 SYRINGE) 40 MG/0.4ML ~~LOC~~ PSKT: 40.0000 mg | PREFILLED_SYRINGE | SUBCUTANEOUS | 2 refills | Status: DC

## 2020-02-18 MED FILL — CYCLOBENZAPRINE HCL 10 MG T: 10 | 30 days supply | Qty: 30 | Fill #0

## 2020-02-19 MED FILL — AMOXICILLIN 500 MG CAPSULE: 500 | 6 days supply | Qty: 25 | Fill #0

## 2020-02-24 ENCOUNTER — Other Ambulatory Visit (HOSPITAL_COMMUNITY): Payer: Self-pay | Admitting: Urology

## 2020-02-24 MED FILL — ESTRADIOL 10 MCG TABS: 10 | 84 days supply | Qty: 24 | Fill #0

## 2020-02-24 MED FILL — ESTRADIOL 0.1 MG/GM CREA: 0.1 | 90 days supply | Qty: 43 | Fill #0

## 2020-03-02 MED FILL — VENLAFAXINE HCL ER 150 MG C: 150 | 90 days supply | Qty: 90 | Fill #2

## 2020-03-02 MED FILL — MINOCYCLINE 50 MG CAPSULE: 50 | 30 days supply | Qty: 60 | Fill #3

## 2020-03-02 MED FILL — ROSUVASTATIN CALCIUM 5 MG T: 5 | 90 days supply | Qty: 90 | Fill #2

## 2020-03-14 ENCOUNTER — Other Ambulatory Visit: Payer: Self-pay | Admitting: Pharmacist

## 2020-03-14 MED ORDER — HUMIRA 40 MG/0.8ML ~~LOC~~ PSKT: PREFILLED_SYRINGE | SUBCUTANEOUS | 4 refills | Status: DC

## 2020-03-14 MED FILL — HUMIRA 40 MG/0.8ML PSKT: 40 | 28 days supply | Qty: 4 | Fill #0

## 2020-03-15 MED FILL — CYCLOBENZAPRINE HCL 10 MG T: 10 | 30 days supply | Qty: 30 | Fill #0

## 2020-03-20 ENCOUNTER — Emergency Department (HOSPITAL_COMMUNITY): Payer: PRIVATE HEALTH INSURANCE

## 2020-03-20 ENCOUNTER — Emergency Department (HOSPITAL_COMMUNITY)
Admission: EM | Admit: 2020-03-20 | Discharge: 2020-03-20 | Disposition: A | Discharge status: Home / Self Care | Payer: PRIVATE HEALTH INSURANCE | Attending: Emergency Medicine | Admitting: Emergency Medicine

## 2020-03-20 ENCOUNTER — Other Ambulatory Visit: Payer: Self-pay

## 2020-03-20 DIAGNOSIS — Z96651 Presence of right artificial knee joint: Secondary | ICD-10-CM | POA: Insufficient documentation

## 2020-03-20 DIAGNOSIS — Y9389 Activity, other specified: Secondary | ICD-10-CM | POA: Insufficient documentation

## 2020-03-20 DIAGNOSIS — Z7982 Long term (current) use of aspirin: Secondary | ICD-10-CM | POA: Insufficient documentation

## 2020-03-20 DIAGNOSIS — Z96641 Presence of right artificial hip joint: Secondary | ICD-10-CM | POA: Insufficient documentation

## 2020-03-20 DIAGNOSIS — Y9289 Other specified places as the place of occurrence of the external cause: Secondary | ICD-10-CM | POA: Diagnosis not present

## 2020-03-20 DIAGNOSIS — M533 Sacrococcygeal disorders, not elsewhere classified: Secondary | ICD-10-CM | POA: Insufficient documentation

## 2020-03-20 DIAGNOSIS — Z96642 Presence of left artificial hip joint: Secondary | ICD-10-CM | POA: Diagnosis not present

## 2020-03-20 DIAGNOSIS — Y999 Unspecified external cause status: Secondary | ICD-10-CM | POA: Diagnosis not present

## 2020-03-20 DIAGNOSIS — I10 Essential (primary) hypertension: Secondary | ICD-10-CM | POA: Diagnosis not present

## 2020-03-20 DIAGNOSIS — S300XXA Contusion of lower back and pelvis, initial encounter: Secondary | ICD-10-CM

## 2020-03-20 DIAGNOSIS — Z79899 Other long term (current) drug therapy: Secondary | ICD-10-CM | POA: Diagnosis not present

## 2020-03-20 DIAGNOSIS — W010XXA Fall on same level from slipping, tripping and stumbling without subsequent striking against object, initial encounter: Secondary | ICD-10-CM | POA: Diagnosis not present

## 2020-03-20 MED ORDER — METHOCARBAMOL 750 MG PO TABS
750.0000 mg | ORAL_TABLET | Freq: Three times a day (TID) | ORAL | 0 refills | Status: DC | PRN
Start: 2020-03-20 — End: 2021-10-10

## 2020-03-20 NOTE — ED Triage Notes (Signed)
Patient bib gems, stumbled down hall today and fell, landed on buttocks. Vagal response, became diaphoretic. Patient a/o x 4 in triage, bp stable, reports pain 2/10.

## 2020-03-20 NOTE — ED Provider Notes (Signed)
[facility] DEPT Provider Note   CSN: [id number] Arrival date & time: 03/20/20  1248     History Chief Complaint  Patient presents with  . Fall    Adrienne Campbell is a 59 y.o. female.  Patient c/o trip and fall at work earlier today. States was moving quickly, when shoe got stuck on floor, and fell, landing on tailbone. C/o pain to coccyx area for post fall. No faintness or dizziness prior to fall. No chest pain or discomfort. No palpitations. Denies hitting head or LOC. Denies neck or back pain, only pain localized to lower sacral/coccyx area. No radicular pain. No numbness/weakness. States otherwise recent health at baseline, felt fine/asymptomatic prior to fall. Skin intact. Denies any saddle area, perineal, or leg numbness or weakness. No urinary retention (or incontinence).   The history is provided by the patient.  Fall Pertinent negatives include no chest pain, no abdominal pain, no headaches and no shortness of breath.       Past Medical History:  Diagnosis Date  . Anemia   . Anxiety   . Arthritis   . Depression   . Environmental and seasonal allergies   . Heart murmur    during pregnancy  . Hyperlipidemia   . Hypertension   . Insomnia   . Pneumonia    hx of  . PONV (postoperative nausea and vomiting)    Nausea, per pt. "felt loopy after hip surgery"   and made hallucinate    Patient Active Problem List   Diagnosis Date Noted  . S/P hip replacement, right 04/27/2019  . Osteoarthritis of right hip 04/24/2019  . Radiculopathy 11/12/2018  . Dislocation of hip prosthesis, initial encounter ([facility]) 12/03/2017  . Primary osteoarthritis of right knee 08/11/2014  . Arthritis of knee 08/11/2014  . Arthritis of left hip 06/28/2014  . Arthritis, hip 06/28/2014  . Insomnia 03/03/2012  . 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 Osteoarthritis of multiple joints 12/04/2010  . Allergic rhinitis 09/03/2010  . Hypercholesteremia 09/04/2007  .  Postmenopausal HRT (hormone replacement therapy) 09/03/2005  . Depression 09/03/2001    Past Surgical History:  Procedure Laterality Date  . ABDOMINAL HYSTERECTOMY    . COLONOSCOPY    . TOTAL HIP ARTHROPLASTY Left 06/28/2014   Procedure: LEFT TOTAL HIP ARTHROPLASTY;  Surgeon: Kerin Salen, MD;  Location: [facility];  Service: Orthopedics;  Laterality: Left;  . TOTAL HIP ARTHROPLASTY Right 04/27/2019   Procedure: Right Anterior Hip Arthroplasty;  Surgeon: Frederik Pear, MD;  Location: [facility];  Service: Orthopedics;  Laterality: Right;  . TOTAL KNEE ARTHROPLASTY Right 08/11/2014   Procedure: RIGHT TOTAL KNEE ARTHROPLASTY;  Surgeon: Kerin Salen, MD;  Location: [facility];  Service: Orthopedics;  Laterality: Right;  . TRANSFORAMINAL LUMBAR INTERBODY FUSION (TLIF) WITH PEDICLE SCREW FIXATION 1 LEVEL Right 11/12/2018   Procedure: RIGHT-SIDED LUMBAR 4-5 TRANSFORAMINAL LUMBAR INTERBODY FUSION WITH INSTRUMENTATION AND ALLOGRAFT;  Surgeon: Phylliss Bob, MD;  Location: [facility];  Service: Orthopedics;  Laterality: Right;     OB History   No obstetric history on file.     No family history on file.  Social History   Tobacco Use  . Smoking status: Never Smoker  . Smokeless tobacco: Never Used  Vaping Use  . Vaping Use: Never used  Substance Use Topics  . Alcohol use: No    Comment: RARE  . Drug use: No    Home Medications Prior to Admission medications   Medication Sig Start Date End Date Taking? Authorizing Provider  acyclovir (ZOVIRAX) 200 MG capsule Take 400 mg by mouth 3 (three) times daily as needed (cold sores).    [provider]  Adalimumab (HUMIRA) 40 MG/0.8ML PSKT Inject 0.8 ml (1 syringe) subcutaneous once weekly. 03/14/20   Tresa Garter, MD  aspirin EC 81 MG tablet Take 1 tablet (81 mg total) by mouth 2 (two) times daily. 04/27/19   Leighton Parody, PA-C  celecoxib (CELEBREX) 200 MG capsule Take 200 mg by mouth 2 (two) times daily.    [provider]    cholecalciferol (VITAMIN D) 25 MCG (1000 UT) tablet Take 1,000 Units by mouth daily.    [provider]  CITRUS BERGAMOT PO Take 1 tablet by mouth every morning.     [provider]  Cyanocobalamin (VITAMIN B-12) 5000 MCG TBDP Take 5,000 mcg by mouth daily.    [provider]  Dapsone 5 % topical gel Apply 1 application topically daily as needed. 02/26/19   [provider]  Estradiol (VAGIFEM) 10 MCG TABS vaginal tablet Place 10 mcg vaginally 2 (two) times a week.     [provider]  hydrochlorothiazide (HYDRODIURIL) 25 MG tablet Take 25 mg by mouth daily. 10/20/18   [provider]  hydrOXYzine (VISTARIL) 25 MG capsule Take 25 mg by mouth at bedtime.  08/22/12   Zenia Resides, MD  minocycline (MINOCIN,DYNACIN) 50 MG capsule Take 50 mg by mouth 2 (two) times daily. 10/08/18   [provider]  Omega-3 1000 MG CAPS Take 1,000 mg by mouth at bedtime.    [provider]  ondansetron (ZOFRAN) 4 MG tablet Take 1 tablet (4 mg total) by mouth daily as needed for nausea or vomiting. 04/27/19 04/26/20  Leighton Parody, PA-C  oxyCODONE-acetaminophen (PERCOCET/ROXICET) 5-325 MG tablet Take 1 tablet by mouth every 4 (four) hours as needed for severe pain. 04/27/19   Leighton Parody, PA-C  Polyethyl Glycol-Propyl Glycol (LUBRICANT EYE DROPS) 0.4-0.3 % SOLN Place 1 drop into both eyes 4 (four) times daily as needed (dry/irritated eyes).    [provider]  rosuvastatin (CRESTOR) 5 MG tablet Take 5 mg by mouth every morning.     [provider]  tiZANidine (ZANAFLEX) 2 MG tablet Take 1 tablet (2 mg total) by mouth every 6 (six) hours as needed. 04/27/19   Leighton Parody, PA-C  venlafaxine XR (EFFEXOR XR) 150 MG 24 hr capsule Take 150 mg by mouth daily with breakfast.  08/22/12   Zenia Resides, MD  vitamin E 400 UNIT capsule Take 400 Units by mouth at bedtime.     [provider]    Allergies    Nsaids, Onion,  Sulfa antibiotics, Fluticasone, Oxycodone, Tramadol, Hydrocodone-acetaminophen, Codeine, and Gluten meal  Review of Systems   Review of Systems  Constitutional: Negative for fever.  HENT: Negative for nosebleeds.   Eyes: Negative for redness.  Respiratory: Negative for shortness of breath.   Cardiovascular: Negative for chest pain.  Gastrointestinal: Negative for abdominal pain.  Genitourinary: Negative for flank pain.  Musculoskeletal: Negative for neck pain.  Skin: Negative for wound.  Neurological: Negative for weakness, numbness and headaches.  Hematological: Does not bruise/bleed easily.  Psychiatric/Behavioral: Negative for confusion.    Physical Exam Updated Vital Signs BP (!) 141/94 (BP Location: Left Arm)   Pulse 88   Temp 98.1 F (36.7 C) (Oral)   Resp 16   Ht 1.651 m (5\' 5" )   Wt 65.8 kg   SpO2 98%  BMI 24.13 kg/m   Physical Exam Vitals and nursing note reviewed.  Constitutional:      Appearance: Normal appearance. She is well-developed.  HENT:     Head: Atraumatic.     Nose: Nose normal.     Mouth/Throat:     Mouth: Mucous membranes are moist.  Eyes:     General: No scleral icterus.    Conjunctiva/sclera: Conjunctivae normal.     Pupils: Pupils are equal, round, and reactive to light.  Neck:     Trachea: No tracheal deviation.  Cardiovascular:     Rate and Rhythm: Normal rate and regular rhythm.     Pulses: Normal pulses.     Heart sounds: Normal heart sounds. No murmur heard.  No friction rub. No gallop.   Pulmonary:     Effort: Pulmonary effort is normal. No respiratory distress.     Breath sounds: Normal breath sounds.  Abdominal:     General: There is no distension.     Palpations: Abdomen is soft.     Tenderness: There is no abdominal tenderness.  Genitourinary:    Comments: No cva tenderness.  Musculoskeletal:        General: No swelling.     Cervical back: Normal range of motion and neck supple. No rigidity. No muscular tenderness.      Comments: Coccyx tenderness, otherwise, CTLS spine, non tender, aligned, no step off. No focal bony tenderness on extremity exam.  Skin:    General: Skin is warm and dry.     Findings: No rash.  Neurological:     Mental Status: She is alert.     Comments: Alert, speech normal. GCS 15. Motor/sens grossly intact. Steady gait.   Psychiatric:        Mood and Affect: Mood normal.     ED Results / Procedures / Treatments   Labs (all labs ordered are listed, but only abnormal results are displayed) Labs Reviewed - No data to display  EKG None  Radiology DG Sacrum/Coccyx  Result Date: 03/20/2020 CLINICAL DATA:  Sacrococcygeal pain following a fall. EXAM: SACRUM AND COCCYX - 2+ VIEW COMPARISON:  None. FINDINGS: Normal appearing sacrum and coccyx without fracture or subluxation. Bilateral hip prostheses. Interbody pedicle screw and rod fusion at the L4-5 level with grade 1 anterolisthesis. IMPRESSION: No sacrococcygeal fracture or subluxation. Electronically Signed   By: Claudie Revering M.D.   On: 03/20/2020 19:19    Procedures Procedures (including critical care time)  Medications Ordered in ED Medications - No data to display  ED Course  I have reviewed the triage vital signs and the nursing notes.  Pertinent labs & imaging results that were available during my care of the patient were reviewed by me and considered in my medical decision making (see chart for details).    MDM Rules/Calculators/A&P                          Discussed pros/cons of sacral/coccyx imaging - pt requests xray. Xray ordered. Pt declines anything for pain in ED, states is ok.   Reviewed nursing notes and prior charts for additional history.   Xrays reviewed/interpreted by me - no fx.   Pt appears stable for d/c.      Final Clinical Impression(s) / ED Diagnoses Final diagnoses:  None    Rx / DC Orders ED Discharge Orders    None       Lajean Saver, MD 03/20/20 1925

## 2020-03-20 NOTE — Discharge Instructions (Addendum)
It was our pleasure to provide your ER care today - we hope that you feel better.  Take acetaminophen as need. You may also take robaxin as need for muscle pain/spasm - no driving when taking.  Follow up with primary care doctor in 1 week if symptoms fail to improve/resolve.  Return to ER if worse, new or severe pain, numbness/weakness, weak/fainting, or other concern.

## 2020-04-11 ENCOUNTER — Other Ambulatory Visit: Payer: Self-pay | Admitting: Dermatology

## 2020-04-11 ENCOUNTER — Telehealth: Payer: Self-pay

## 2020-04-11 MED ORDER — MINOCYCLINE HCL 50 MG PO CAPS: 50.0000 mg | ORAL_CAPSULE | Freq: Two times a day (BID) | ORAL | 0 refills | Status: DC

## 2020-04-11 MED FILL — MINOCYCLINE 50 MG CAPSULE: 50 | 30 days supply | Qty: 60 | Fill #0

## 2020-04-11 MED FILL — HUMIRA 40 MG/0.8ML PSKT: 40 | 28 days supply | Qty: 4 | Fill #1

## 2020-04-11 MED FILL — HYDROXYZINE PAMOATE 25 MG C: 25 | 90 days supply | Qty: 90 | Fill #0

## 2020-04-11 MED FILL — CELECOXIB 200 MG CAP: 200 | 90 days supply | Qty: 180 | Fill #0

## 2020-04-11 NOTE — Telephone Encounter (Signed)
Patient needs ov no further refills

## 2020-04-12 ENCOUNTER — Other Ambulatory Visit (HOSPITAL_COMMUNITY): Payer: Self-pay | Admitting: Physician Assistant

## 2020-04-12 MED FILL — CYCLOBENZAPRINE HCL 10 MG T: 10 | 30 days supply | Qty: 30 | Fill #0

## 2020-04-13 ENCOUNTER — Other Ambulatory Visit (HOSPITAL_COMMUNITY): Payer: Self-pay | Admitting: Family Medicine

## 2020-04-20 ENCOUNTER — Other Ambulatory Visit: Payer: Self-pay

## 2020-04-20 ENCOUNTER — Emergency Department (HOSPITAL_BASED_OUTPATIENT_CLINIC_OR_DEPARTMENT_OTHER)
Admission: EM | Admit: 2020-04-20 | Discharge: 2020-04-20 | Disposition: A | Discharge status: Home / Self Care | Payer: No Typology Code available for payment source | Attending: Emergency Medicine | Admitting: Emergency Medicine

## 2020-04-20 ENCOUNTER — Encounter (HOSPITAL_BASED_OUTPATIENT_CLINIC_OR_DEPARTMENT_OTHER): Payer: Self-pay

## 2020-04-20 DIAGNOSIS — Z5321 Procedure and treatment not carried out due to patient leaving prior to being seen by health care provider: Secondary | ICD-10-CM | POA: Diagnosis not present

## 2020-04-20 DIAGNOSIS — T7840XA Allergy, unspecified, initial encounter: Secondary | ICD-10-CM | POA: Insufficient documentation

## 2020-04-20 DIAGNOSIS — R070 Pain in throat: Secondary | ICD-10-CM | POA: Insufficient documentation

## 2020-04-20 NOTE — ED Triage Notes (Addendum)
Pt c/o feeling of "throat closing" x 2 separate episodes today-one after eating hot sauce and the other after a peppermint-she states EMS was called to scene for 2nd event ~6pm-no meds given and was not transported to ED-pt states hx of same in the past but feels today was the worst-throat inspection WNL-no swelling visualized-pt NAD-steady gait

## 2020-05-01 MED FILL — LISINOPRIL-HCTZ 10-12.5 MG: 10-12.5 | 90 days supply | Qty: 90 | Fill #3

## 2020-05-04 MED FILL — HUMIRA 40 MG/0.8ML PSKT: 40 | 28 days supply | Qty: 4 | Fill #2

## 2020-05-06 ENCOUNTER — Other Ambulatory Visit (HOSPITAL_COMMUNITY): Payer: Self-pay | Admitting: Family Medicine

## 2020-05-06 MED FILL — SIMVASTATIN 10 MG TABLET: 10 | 30 days supply | Qty: 30 | Fill #0

## 2020-05-11 MED FILL — CYCLOBENZAPRINE HCL 10 MG T: 10 | 30 days supply | Qty: 30 | Fill #0

## 2020-05-13 MED FILL — HUMIRA 40 MG/0.8ML PSKT: 40 | 28 days supply | Qty: 4 | Fill #2

## 2020-05-25 ENCOUNTER — Ambulatory Visit: Payer: Self-pay | Admitting: Dermatology

## 2020-06-06 ENCOUNTER — Other Ambulatory Visit (HOSPITAL_COMMUNITY): Payer: Self-pay | Admitting: Physician Assistant

## 2020-06-06 MED FILL — SIMVASTATIN 10 MG TABLET: 10 | 30 days supply | Qty: 30 | Fill #1

## 2020-06-06 MED FILL — CYCLOBENZAPRINE HCL 10 MG T: 10 | 30 days supply | Qty: 30 | Fill #0

## 2020-06-06 MED FILL — VENLAFAXINE HCL ER 150 MG C: 150 | 90 days supply | Qty: 90 | Fill #3

## 2020-06-07 MED FILL — HUMIRA 40 MG/0.8ML PSKT: 40 | 28 days supply | Qty: 4 | Fill #3

## 2020-07-06 ENCOUNTER — Other Ambulatory Visit (HOSPITAL_COMMUNITY): Payer: Self-pay | Admitting: Physician Assistant

## 2020-07-06 MED FILL — HYDROXYZINE PAMOATE 25 MG C: 25 | 90 days supply | Qty: 90 | Fill #1

## 2020-07-06 MED FILL — SIMVASTATIN 10 MG TABLET: 10 | 30 days supply | Qty: 30 | Fill #2

## 2020-07-06 MED FILL — CYCLOBENZAPRINE HCL 10 MG T: 10 | 30 days supply | Qty: 30 | Fill #0

## 2020-07-15 MED FILL — ESTRADIOL 10 MCG TABS: 10 | 84 days supply | Qty: 24 | Fill #1

## 2020-07-15 MED FILL — HUMIRA 40 MG/0.8ML PSKT: 40 | 28 days supply | Qty: 4 | Fill #4

## 2020-07-27 ENCOUNTER — Ambulatory Visit: Payer: Self-pay | Admitting: Dermatology

## 2020-08-01 ENCOUNTER — Other Ambulatory Visit: Payer: Self-pay | Admitting: Pharmacist

## 2020-08-01 ENCOUNTER — Other Ambulatory Visit: Payer: Self-pay | Admitting: Dermatology

## 2020-08-01 MED ORDER — HUMIRA 40 MG/0.8ML ~~LOC~~ PSKT: PREFILLED_SYRINGE | SUBCUTANEOUS | 4 refills | Status: DC

## 2020-08-01 MED FILL — CELECOXIB 200 MG CAP: 200 | 90 days supply | Qty: 180 | Fill #0

## 2020-08-01 MED FILL — SIMVASTATIN 10 MG TABLET: 10 | 30 days supply | Qty: 30 | Fill #3

## 2020-08-01 MED FILL — LISINOPRIL-HCTZ 10-12.5 MG: 10-12.5 | 90 days supply | Qty: 90 | Fill #0

## 2020-08-03 ENCOUNTER — Other Ambulatory Visit (HOSPITAL_COMMUNITY): Payer: Self-pay | Admitting: Physician Assistant

## 2020-08-03 MED FILL — CYCLOBENZAPRINE HCL 10 MG T: 10 | 30 days supply | Qty: 30 | Fill #0

## 2020-08-08 ENCOUNTER — Other Ambulatory Visit (HOSPITAL_COMMUNITY): Payer: Self-pay | Admitting: Otolaryngology

## 2020-08-08 MED FILL — OMEPRAZOLE 40 MG CPDR: 40 | 30 days supply | Qty: 30 | Fill #0

## 2020-08-08 MED FILL — HUMIRA 40 MG/0.8ML PSKT: 40 | 28 days supply | Qty: 4 | Fill #0

## 2020-08-08 MED FILL — SIMVASTATIN 20 MG TABLET: 20 | 90 days supply | Qty: 90 | Fill #0

## 2020-09-01 MED FILL — OMEPRAZOLE 40 MG CPDR: 40 | 30 days supply | Qty: 30 | Fill #1

## 2020-09-05 MED FILL — HUMIRA 40 MG/0.8ML PSKT: 40 | 28 days supply | Qty: 4 | Fill #1

## 2020-09-12 ENCOUNTER — Other Ambulatory Visit (HOSPITAL_COMMUNITY): Payer: Self-pay | Admitting: Family Medicine

## 2020-09-12 MED FILL — VENLAFAXINE HCL ER 150 MG C: 150 | 90 days supply | Qty: 90 | Fill #0

## 2020-09-12 MED FILL — CYCLOBENZAPRINE HCL 10 MG T: 10 | 30 days supply | Qty: 30 | Fill #0

## 2020-09-19 ENCOUNTER — Telehealth: Payer: Self-pay | Admitting: Pharmacist

## 2020-09-19 NOTE — Telephone Encounter (Signed)
Called patient to schedule an appointment for the [facility]. I was unable to reach the patient so I left a HIPAA-compliant message requesting that the patient return my call.   Luke Van Ausdall, PharmD, BCACP, CPP Clinical Pharmacist [facility] [phone]  

## 2020-10-06 MED FILL — ESTRADIOL 10 MCG TABS: 10 | 84 days supply | Qty: 24 | Fill #2

## 2020-10-07 MED FILL — HUMIRA 40 MG/0.8ML PSKT: 40 | 28 days supply | Qty: 4 | Fill #2

## 2020-10-12 MED FILL — HYDROXYZINE PAMOATE 25 MG C: 25 | 90 days supply | Qty: 90 | Fill #0

## 2020-10-17 ENCOUNTER — Other Ambulatory Visit (HOSPITAL_COMMUNITY): Payer: Self-pay | Admitting: Family Medicine

## 2020-10-20 ENCOUNTER — Other Ambulatory Visit (HOSPITAL_COMMUNITY): Payer: Self-pay | Admitting: Physician Assistant

## 2020-10-20 MED FILL — CYCLOBENZAPRINE HCL 10 MG T: 10 | 30 days supply | Qty: 30 | Fill #0

## 2020-10-24 DIAGNOSIS — Z23 Encounter for immunization: Secondary | ICD-10-CM | POA: Diagnosis not present

## 2020-10-24 MED FILL — CELECOXIB 200 MG CAP: 200 | 90 days supply | Qty: 180 | Fill #0

## 2020-10-26 DIAGNOSIS — Z20822 Contact with and (suspected) exposure to covid-19: Secondary | ICD-10-CM | POA: Diagnosis not present

## 2020-10-28 MED FILL — HUMIRA 40 MG/0.8ML PSKT: 40 | 28 days supply | Qty: 4 | Fill #3

## 2020-11-21 ENCOUNTER — Other Ambulatory Visit (HOSPITAL_COMMUNITY): Payer: Self-pay | Admitting: Family Medicine

## 2020-11-21 MED FILL — LISINOPRIL-HCTZ 10-12.5 MG: 10-12.5 | 90 days supply | Qty: 90 | Fill #0

## 2020-11-21 MED FILL — SIMVASTATIN 20 MG TABLET: 20 | 90 days supply | Qty: 90 | Fill #1

## 2020-11-23 MED FILL — HUMIRA 40 MG/0.8ML PSKT: 40 | 28 days supply | Qty: 4 | Fill #4

## 2020-12-06 ENCOUNTER — Other Ambulatory Visit (HOSPITAL_COMMUNITY): Payer: Self-pay

## 2020-12-06 DIAGNOSIS — M25552 Pain in left hip: Secondary | ICD-10-CM | POA: Diagnosis not present

## 2020-12-06 DIAGNOSIS — Z96642 Presence of left artificial hip joint: Secondary | ICD-10-CM | POA: Diagnosis not present

## 2020-12-06 DIAGNOSIS — Z96641 Presence of right artificial hip joint: Secondary | ICD-10-CM | POA: Diagnosis not present

## 2020-12-06 MED FILL — Venlafaxine HCl Cap ER 24HR 150 MG (Base Equivalent): ORAL | 90 days supply | Qty: 90 | Fill #0 | Status: AC

## 2020-12-06 MED FILL — Cyclobenzaprine HCl Tab 10 MG: ORAL | 30 days supply | Qty: 30 | Fill #0 | Status: AC

## 2020-12-06 MED FILL — Omeprazole Cap Delayed Release 40 MG: ORAL | 30 days supply | Qty: 30 | Fill #0 | Status: AC

## 2020-12-08 ENCOUNTER — Other Ambulatory Visit (HOSPITAL_COMMUNITY): Payer: Self-pay

## 2020-12-15 ENCOUNTER — Other Ambulatory Visit (HOSPITAL_COMMUNITY): Payer: Self-pay

## 2020-12-19 ENCOUNTER — Other Ambulatory Visit (HOSPITAL_COMMUNITY): Payer: Self-pay

## 2020-12-20 ENCOUNTER — Other Ambulatory Visit (HOSPITAL_BASED_OUTPATIENT_CLINIC_OR_DEPARTMENT_OTHER): Payer: Self-pay

## 2020-12-26 ENCOUNTER — Other Ambulatory Visit (HOSPITAL_COMMUNITY): Payer: Self-pay

## 2021-01-03 ENCOUNTER — Other Ambulatory Visit (HOSPITAL_COMMUNITY): Payer: Self-pay

## 2021-01-03 MED ORDER — ESTRADIOL 0.1 MG/GM VA CREA
TOPICAL_CREAM | VAGINAL | 2 refills | Status: DC
  Filled 2021-01-03: qty 42.5, 30d supply, fill #0

## 2021-01-05 ENCOUNTER — Other Ambulatory Visit: Payer: Self-pay | Admitting: Pharmacist

## 2021-01-05 ENCOUNTER — Other Ambulatory Visit (HOSPITAL_COMMUNITY): Payer: Self-pay

## 2021-01-05 MED ORDER — HUMIRA 40 MG/0.8ML ~~LOC~~ PSKT: PREFILLED_SYRINGE | SUBCUTANEOUS | 4 refills | Status: DC

## 2021-01-05 MED ORDER — HUMIRA 40 MG/0.8ML ~~LOC~~ PSKT
PREFILLED_SYRINGE | SUBCUTANEOUS | 4 refills | Status: DC
  Filled 2021-01-05: qty 4, 28d supply, fill #0
  Filled 2021-02-01: qty 4, 28d supply, fill #1
  Filled 2021-02-28 – 2021-03-02 (×2): qty 4, 28d supply, fill #2
  Filled 2021-03-22: qty 4, 28d supply, fill #3
  Filled 2021-04-26: qty 4, 28d supply, fill #4

## 2021-01-05 MED FILL — Estradiol Vaginal Tab 10 MCG: VAGINAL | 84 days supply | Qty: 24 | Fill #0 | Status: AC

## 2021-01-05 MED FILL — Omeprazole Cap Delayed Release 40 MG: ORAL | 30 days supply | Qty: 30 | Fill #1 | Status: AC

## 2021-01-05 MED FILL — Hydroxyzine Pamoate Cap 25 MG: ORAL | 90 days supply | Qty: 90 | Fill #0 | Status: AC

## 2021-01-05 NOTE — Progress Notes (Signed)
  S: Patient presents for review of their specialty medication therapy.  Patient is currently taking Humira for ankylosing spondylitis. Patient is managed by Marella Chimes for this.   Adherence: denies any missed doses  Efficacy: working well for her  Dosing:  Ankylosing spondylitis: SubQ: 40 mg every week  Dose adjustments: Renal: no dose adjustments (has not been studied) Hepatic: no dose adjustments (has not been studied)  Screening: TB test: completed per patient; negative Hepatitis: completed per patient  Monitoring: S/sx of infection: denies CBC: followed by rheum, has been WNL S/sx of hypersensitivity: denies S/sx of malignancy: denies S/sx of heart failure: denies Recently underwent testing for Ab, and this was negative  O:      Lab Results  Component Value Date   WBC 14.5 (H) 04/28/2019   HGB 11.9 (L) 04/28/2019   HCT 35.6 (L) 04/28/2019   MCV 103.5 (H) 04/28/2019   PLT 287 04/28/2019      Chemistry      Component Value Date/Time   NA 135 04/28/2019 0329   K 4.1 04/28/2019 0329   CL 102 04/28/2019 0329   CO2 23 04/28/2019 0329   BUN 7 04/28/2019 0329   CREATININE 0.66 04/28/2019 0329      Component Value Date/Time   CALCIUM 8.7 (L) 04/28/2019 0329   ALKPHOS 70 11/07/2018 1329   AST 21 11/07/2018 1329   ALT 15 11/07/2018 1329   BILITOT 0.5 11/07/2018 1329     A/P: 1. Medication review: Patient currently on Humira for ankylosing spondylitis. Reviewed the medication with the patient, including the following: Humira is a TNF blocking agent indicated for ankylosing spondylitis, Crohn's disease, Hidradenitis suppurativa, psoriatic arthritis, plaque psoriasis, ulcerative colitis, and uveitis. Patient educated on purpose, proper use and potential adverse effects of Humira. The most common adverse effects are infections, headache, and injection site reactions. There is the possibility of an increased risk of malignancy but it is not well understood if this  increased risk is due to there medication or the disease state. There are rare cases of pancytopenia and aplastic anemia. No recommendations for any changes at this time.  Benard Halsted, PharmD, [place], [place] [phone]

## 2021-01-06 ENCOUNTER — Other Ambulatory Visit (HOSPITAL_COMMUNITY): Payer: Self-pay

## 2021-01-06 MED ORDER — CYCLOBENZAPRINE HCL 10 MG PO TABS
1.0000 | ORAL_TABLET | Freq: Every day | ORAL | 2 refills | Status: DC
  Filled 2021-01-06: qty 30, 30d supply, fill #0
  Filled 2021-02-02: qty 30, 30d supply, fill #1
  Filled 2021-03-01: qty 30, 30d supply, fill #2

## 2021-01-16 ENCOUNTER — Other Ambulatory Visit (HOSPITAL_COMMUNITY): Payer: Self-pay

## 2021-01-16 MED ORDER — CEPHALEXIN 500 MG PO CAPS
ORAL_CAPSULE | ORAL | 0 refills | Status: DC
  Filled 2021-01-16: qty 21, 7d supply, fill #0

## 2021-01-17 ENCOUNTER — Other Ambulatory Visit (HOSPITAL_COMMUNITY): Payer: Self-pay

## 2021-01-18 DIAGNOSIS — L03012 Cellulitis of left finger: Secondary | ICD-10-CM | POA: Diagnosis not present

## 2021-01-19 ENCOUNTER — Other Ambulatory Visit (HOSPITAL_COMMUNITY): Payer: Self-pay

## 2021-01-19 ENCOUNTER — Other Ambulatory Visit: Payer: Self-pay

## 2021-01-19 MED ORDER — CELECOXIB 200 MG PO CAPS
ORAL_CAPSULE | ORAL | 1 refills | Status: DC
  Filled 2021-01-19: qty 180, 90d supply, fill #0
  Filled 2021-04-12: qty 180, 90d supply, fill #1

## 2021-01-20 ENCOUNTER — Other Ambulatory Visit (HOSPITAL_COMMUNITY): Payer: Self-pay

## 2021-01-26 ENCOUNTER — Other Ambulatory Visit (HOSPITAL_COMMUNITY): Payer: Self-pay

## 2021-02-01 ENCOUNTER — Other Ambulatory Visit (HOSPITAL_COMMUNITY): Payer: Self-pay

## 2021-02-02 ENCOUNTER — Other Ambulatory Visit (HOSPITAL_COMMUNITY): Payer: Self-pay

## 2021-02-02 MED FILL — Lisinopril & Hydrochlorothiazide Tab 10-12.5 MG: ORAL | 90 days supply | Qty: 90 | Fill #0 | Status: AC

## 2021-02-06 DIAGNOSIS — R490 Dysphonia: Secondary | ICD-10-CM | POA: Diagnosis not present

## 2021-02-06 DIAGNOSIS — J385 Laryngeal spasm: Secondary | ICD-10-CM | POA: Diagnosis not present

## 2021-02-06 DIAGNOSIS — K219 Gastro-esophageal reflux disease without esophagitis: Secondary | ICD-10-CM | POA: Diagnosis not present

## 2021-02-06 DIAGNOSIS — Z79899 Other long term (current) drug therapy: Secondary | ICD-10-CM | POA: Diagnosis not present

## 2021-02-06 DIAGNOSIS — J383 Other diseases of vocal cords: Secondary | ICD-10-CM | POA: Diagnosis not present

## 2021-02-08 DIAGNOSIS — M150 Primary generalized (osteo)arthritis: Secondary | ICD-10-CM | POA: Diagnosis not present

## 2021-02-08 DIAGNOSIS — R5382 Chronic fatigue, unspecified: Secondary | ICD-10-CM | POA: Diagnosis not present

## 2021-02-08 DIAGNOSIS — Z79899 Other long term (current) drug therapy: Secondary | ICD-10-CM | POA: Diagnosis not present

## 2021-02-08 DIAGNOSIS — M533 Sacrococcygeal disorders, not elsewhere classified: Secondary | ICD-10-CM | POA: Diagnosis not present

## 2021-02-08 DIAGNOSIS — Z7189 Other specified counseling: Secondary | ICD-10-CM | POA: Diagnosis not present

## 2021-02-08 DIAGNOSIS — G8929 Other chronic pain: Secondary | ICD-10-CM | POA: Diagnosis not present

## 2021-02-08 DIAGNOSIS — J385 Laryngeal spasm: Secondary | ICD-10-CM | POA: Diagnosis not present

## 2021-02-08 DIAGNOSIS — R252 Cramp and spasm: Secondary | ICD-10-CM | POA: Diagnosis not present

## 2021-02-08 DIAGNOSIS — M2550 Pain in unspecified joint: Secondary | ICD-10-CM | POA: Diagnosis not present

## 2021-02-08 DIAGNOSIS — M47899 Other spondylosis, site unspecified: Secondary | ICD-10-CM | POA: Diagnosis not present

## 2021-02-08 DIAGNOSIS — R490 Dysphonia: Secondary | ICD-10-CM | POA: Diagnosis not present

## 2021-02-08 DIAGNOSIS — M62838 Other muscle spasm: Secondary | ICD-10-CM | POA: Diagnosis not present

## 2021-02-14 ENCOUNTER — Other Ambulatory Visit (HOSPITAL_COMMUNITY): Payer: Self-pay

## 2021-02-14 MED FILL — Simvastatin Tab 20 MG: ORAL | 90 days supply | Qty: 90 | Fill #0 | Status: AC

## 2021-02-24 ENCOUNTER — Other Ambulatory Visit (HOSPITAL_COMMUNITY): Payer: Self-pay

## 2021-02-27 ENCOUNTER — Other Ambulatory Visit (HOSPITAL_COMMUNITY): Payer: Self-pay

## 2021-02-28 ENCOUNTER — Other Ambulatory Visit (HOSPITAL_COMMUNITY): Payer: Self-pay

## 2021-03-01 ENCOUNTER — Other Ambulatory Visit (HOSPITAL_COMMUNITY): Payer: Self-pay

## 2021-03-01 DIAGNOSIS — R490 Dysphonia: Secondary | ICD-10-CM | POA: Diagnosis not present

## 2021-03-01 DIAGNOSIS — J385 Laryngeal spasm: Secondary | ICD-10-CM | POA: Diagnosis not present

## 2021-03-01 MED FILL — Venlafaxine HCl Cap ER 24HR 150 MG (Base Equivalent): ORAL | 90 days supply | Qty: 90 | Fill #1 | Status: AC

## 2021-03-02 ENCOUNTER — Other Ambulatory Visit (HOSPITAL_COMMUNITY): Payer: Self-pay

## 2021-03-02 MED FILL — Omeprazole Cap Delayed Release 40 MG: ORAL | 30 days supply | Qty: 30 | Fill #2 | Status: AC

## 2021-03-22 ENCOUNTER — Other Ambulatory Visit (HOSPITAL_COMMUNITY): Payer: Self-pay

## 2021-03-28 ENCOUNTER — Other Ambulatory Visit (HOSPITAL_COMMUNITY): Payer: Self-pay

## 2021-03-29 ENCOUNTER — Other Ambulatory Visit (HOSPITAL_COMMUNITY): Payer: Self-pay

## 2021-03-29 MED ORDER — HYDROXYZINE PAMOATE 25 MG PO CAPS
ORAL_CAPSULE | ORAL | 0 refills | Status: DC
  Filled 2021-03-29: qty 90, 90d supply, fill #0

## 2021-04-12 ENCOUNTER — Other Ambulatory Visit (HOSPITAL_COMMUNITY): Payer: Self-pay

## 2021-04-12 DIAGNOSIS — R490 Dysphonia: Secondary | ICD-10-CM | POA: Diagnosis not present

## 2021-04-12 DIAGNOSIS — J385 Laryngeal spasm: Secondary | ICD-10-CM | POA: Diagnosis not present

## 2021-04-12 MED ORDER — CYCLOBENZAPRINE HCL 10 MG PO TABS
ORAL_TABLET | ORAL | 3 refills | Status: DC
  Filled 2021-04-12: qty 30, 30d supply, fill #0
  Filled 2021-05-09: qty 30, 30d supply, fill #1
  Filled 2021-06-09: qty 30, 30d supply, fill #2
  Filled 2021-07-04: qty 30, 30d supply, fill #3

## 2021-04-15 ENCOUNTER — Other Ambulatory Visit (HOSPITAL_COMMUNITY): Payer: Self-pay

## 2021-04-20 ENCOUNTER — Other Ambulatory Visit: Payer: Self-pay | Admitting: Urology

## 2021-04-20 ENCOUNTER — Other Ambulatory Visit (HOSPITAL_COMMUNITY): Payer: Self-pay

## 2021-04-20 MED ORDER — ESTRADIOL 10 MCG VA TABS
ORAL_TABLET | VAGINAL | 3 refills | Status: DC
  Filled 2021-04-20: qty 24, 84d supply, fill #0
  Filled 2021-07-19: qty 24, 84d supply, fill #1

## 2021-04-26 ENCOUNTER — Other Ambulatory Visit (HOSPITAL_COMMUNITY): Payer: Self-pay

## 2021-05-01 ENCOUNTER — Other Ambulatory Visit (HOSPITAL_COMMUNITY): Payer: Self-pay

## 2021-05-01 MED FILL — Omeprazole Cap Delayed Release 40 MG: ORAL | 30 days supply | Qty: 30 | Fill #3 | Status: AC

## 2021-05-03 ENCOUNTER — Other Ambulatory Visit (HOSPITAL_COMMUNITY): Payer: Self-pay

## 2021-05-06 ENCOUNTER — Other Ambulatory Visit (HOSPITAL_COMMUNITY): Payer: Self-pay

## 2021-05-09 ENCOUNTER — Other Ambulatory Visit (HOSPITAL_COMMUNITY): Payer: Self-pay

## 2021-05-09 MED ORDER — CELECOXIB 200 MG PO CAPS
ORAL_CAPSULE | ORAL | 1 refills | Status: DC
  Filled 2021-05-09 (×2): qty 180, 90d supply, fill #0

## 2021-05-09 MED FILL — Simvastatin Tab 20 MG: ORAL | 90 days supply | Qty: 90 | Fill #1 | Status: AC

## 2021-05-09 MED FILL — Lisinopril & Hydrochlorothiazide Tab 10-12.5 MG: ORAL | 90 days supply | Qty: 90 | Fill #1 | Status: AC

## 2021-05-22 ENCOUNTER — Other Ambulatory Visit (HOSPITAL_COMMUNITY): Payer: Self-pay

## 2021-05-22 DIAGNOSIS — N952 Postmenopausal atrophic vaginitis: Secondary | ICD-10-CM | POA: Diagnosis not present

## 2021-05-22 DIAGNOSIS — N393 Stress incontinence (female) (male): Secondary | ICD-10-CM | POA: Diagnosis not present

## 2021-05-22 DIAGNOSIS — N3020 Other chronic cystitis without hematuria: Secondary | ICD-10-CM | POA: Diagnosis not present

## 2021-05-22 MED ORDER — ESTRADIOL 10 MCG VA TABS
10.0000 ug | ORAL_TABLET | VAGINAL | 3 refills | Status: DC
  Filled 2021-05-22 – 2021-11-04 (×2): qty 24, 84d supply, fill #0
  Filled 2022-02-15: qty 24, 84d supply, fill #1

## 2021-05-23 DIAGNOSIS — G4733 Obstructive sleep apnea (adult) (pediatric): Secondary | ICD-10-CM | POA: Diagnosis not present

## 2021-05-23 DIAGNOSIS — R232 Flushing: Secondary | ICD-10-CM | POA: Diagnosis not present

## 2021-05-23 DIAGNOSIS — R7309 Other abnormal glucose: Secondary | ICD-10-CM | POA: Diagnosis not present

## 2021-05-23 DIAGNOSIS — R5383 Other fatigue: Secondary | ICD-10-CM | POA: Diagnosis not present

## 2021-05-23 DIAGNOSIS — E7800 Pure hypercholesterolemia, unspecified: Secondary | ICD-10-CM | POA: Diagnosis not present

## 2021-05-23 DIAGNOSIS — Z79899 Other long term (current) drug therapy: Secondary | ICD-10-CM | POA: Diagnosis not present

## 2021-05-23 DIAGNOSIS — I10 Essential (primary) hypertension: Secondary | ICD-10-CM | POA: Diagnosis not present

## 2021-05-25 ENCOUNTER — Other Ambulatory Visit (HOSPITAL_COMMUNITY): Payer: Self-pay

## 2021-05-29 ENCOUNTER — Other Ambulatory Visit (HOSPITAL_COMMUNITY): Payer: Self-pay

## 2021-05-29 ENCOUNTER — Other Ambulatory Visit: Payer: Self-pay | Admitting: Pharmacist

## 2021-05-29 MED ORDER — HUMIRA (2 SYRINGE) 40 MG/0.4ML ~~LOC~~ PSKT: PREFILLED_SYRINGE | SUBCUTANEOUS | 3 refills | Status: DC

## 2021-05-29 MED ORDER — HUMIRA (2 SYRINGE) 40 MG/0.4ML ~~LOC~~ PSKT
PREFILLED_SYRINGE | SUBCUTANEOUS | 3 refills | Status: DC
  Filled 2021-05-29: qty 4, 28d supply, fill #0

## 2021-05-30 ENCOUNTER — Other Ambulatory Visit (HOSPITAL_COMMUNITY): Payer: Self-pay

## 2021-05-30 DIAGNOSIS — Z79899 Other long term (current) drug therapy: Secondary | ICD-10-CM | POA: Diagnosis not present

## 2021-05-30 DIAGNOSIS — R7309 Other abnormal glucose: Secondary | ICD-10-CM | POA: Diagnosis not present

## 2021-05-30 DIAGNOSIS — R5383 Other fatigue: Secondary | ICD-10-CM | POA: Diagnosis not present

## 2021-06-01 ENCOUNTER — Other Ambulatory Visit (HOSPITAL_COMMUNITY): Payer: Self-pay

## 2021-06-05 ENCOUNTER — Other Ambulatory Visit (HOSPITAL_COMMUNITY): Payer: Self-pay

## 2021-06-05 DIAGNOSIS — F32A Depression, unspecified: Secondary | ICD-10-CM | POA: Diagnosis not present

## 2021-06-05 DIAGNOSIS — I10 Essential (primary) hypertension: Secondary | ICD-10-CM | POA: Diagnosis not present

## 2021-06-05 DIAGNOSIS — G4719 Other hypersomnia: Secondary | ICD-10-CM | POA: Diagnosis not present

## 2021-06-05 MED FILL — Omeprazole Cap Delayed Release 40 MG: ORAL | 30 days supply | Qty: 30 | Fill #4 | Status: AC

## 2021-06-07 ENCOUNTER — Other Ambulatory Visit (HOSPITAL_COMMUNITY): Payer: Self-pay

## 2021-06-08 ENCOUNTER — Other Ambulatory Visit (HOSPITAL_COMMUNITY): Payer: Self-pay

## 2021-06-08 ENCOUNTER — Other Ambulatory Visit: Payer: Self-pay | Admitting: Pharmacist

## 2021-06-08 MED ORDER — HUMIRA 40 MG/0.8ML ~~LOC~~ PSKT
PREFILLED_SYRINGE | SUBCUTANEOUS | 3 refills | Status: DC
  Filled 2021-06-08: qty 4, fill #0
  Filled 2021-06-09: qty 4, 28d supply, fill #0
  Filled 2021-07-04 – 2021-07-05 (×2): qty 4, 28d supply, fill #1
  Filled 2021-08-04: qty 4, 28d supply, fill #2
  Filled 2021-09-08: qty 4, 28d supply, fill #3

## 2021-06-08 MED ORDER — HUMIRA 40 MG/0.8ML ~~LOC~~ PSKT: PREFILLED_SYRINGE | SUBCUTANEOUS | 3 refills | Status: DC

## 2021-06-09 ENCOUNTER — Other Ambulatory Visit (HOSPITAL_COMMUNITY): Payer: Self-pay

## 2021-06-09 MED FILL — Venlafaxine HCl Cap ER 24HR 150 MG (Base Equivalent): ORAL | 90 days supply | Qty: 90 | Fill #2 | Status: AC

## 2021-06-21 ENCOUNTER — Other Ambulatory Visit (HOSPITAL_COMMUNITY): Payer: Self-pay

## 2021-06-22 ENCOUNTER — Other Ambulatory Visit (HOSPITAL_COMMUNITY): Payer: Self-pay

## 2021-06-22 MED ORDER — HYDROXYZINE PAMOATE 25 MG PO CAPS
25.0000 mg | ORAL_CAPSULE | Freq: Every day | ORAL | 0 refills | Status: DC
  Filled 2021-06-22: qty 90, 90d supply, fill #0

## 2021-06-28 ENCOUNTER — Other Ambulatory Visit (HOSPITAL_COMMUNITY): Payer: Self-pay

## 2021-06-30 ENCOUNTER — Other Ambulatory Visit (HOSPITAL_COMMUNITY): Payer: Self-pay

## 2021-07-04 ENCOUNTER — Other Ambulatory Visit (HOSPITAL_COMMUNITY): Payer: Self-pay

## 2021-07-04 ENCOUNTER — Other Ambulatory Visit (HOSPITAL_BASED_OUTPATIENT_CLINIC_OR_DEPARTMENT_OTHER): Payer: Self-pay

## 2021-07-04 DIAGNOSIS — R0683 Snoring: Secondary | ICD-10-CM

## 2021-07-04 DIAGNOSIS — G4710 Hypersomnia, unspecified: Secondary | ICD-10-CM

## 2021-07-05 ENCOUNTER — Other Ambulatory Visit (HOSPITAL_COMMUNITY): Payer: Self-pay

## 2021-07-05 MED FILL — Omeprazole Cap Delayed Release 40 MG: ORAL | 30 days supply | Qty: 30 | Fill #5 | Status: AC

## 2021-07-06 ENCOUNTER — Other Ambulatory Visit (HOSPITAL_COMMUNITY): Payer: Self-pay

## 2021-07-10 ENCOUNTER — Other Ambulatory Visit (HOSPITAL_COMMUNITY): Payer: Self-pay

## 2021-07-13 ENCOUNTER — Other Ambulatory Visit (HOSPITAL_COMMUNITY): Payer: Self-pay

## 2021-07-14 ENCOUNTER — Other Ambulatory Visit (HOSPITAL_COMMUNITY): Payer: Self-pay

## 2021-07-14 MED ORDER — VENLAFAXINE HCL ER 150 MG PO CP24
150.0000 mg | ORAL_CAPSULE | Freq: Every day | ORAL | 0 refills | Status: DC
  Filled 2021-07-14: qty 87, 87d supply, fill #0
  Filled 2021-09-28: qty 87, 87d supply, fill #1

## 2021-07-15 ENCOUNTER — Other Ambulatory Visit (HOSPITAL_COMMUNITY): Payer: Self-pay

## 2021-07-17 ENCOUNTER — Other Ambulatory Visit (HOSPITAL_COMMUNITY): Payer: Self-pay

## 2021-07-17 MED ORDER — VENLAFAXINE HCL ER 150 MG PO CP24
150.0000 mg | ORAL_CAPSULE | Freq: Every day | ORAL | 0 refills | Status: DC
  Filled 2021-09-28: qty 90, 90d supply, fill #0

## 2021-07-19 ENCOUNTER — Other Ambulatory Visit (HOSPITAL_COMMUNITY): Payer: Self-pay

## 2021-07-20 ENCOUNTER — Other Ambulatory Visit (HOSPITAL_COMMUNITY): Payer: Self-pay

## 2021-07-20 MED ORDER — CELECOXIB 200 MG PO CAPS
ORAL_CAPSULE | ORAL | 0 refills | Status: DC
  Filled 2021-07-20: qty 180, 90d supply, fill #0

## 2021-07-31 ENCOUNTER — Other Ambulatory Visit (HOSPITAL_COMMUNITY): Payer: Self-pay

## 2021-08-01 ENCOUNTER — Other Ambulatory Visit (HOSPITAL_COMMUNITY): Payer: Self-pay

## 2021-08-01 ENCOUNTER — Encounter (HOSPITAL_BASED_OUTPATIENT_CLINIC_OR_DEPARTMENT_OTHER): Payer: No Typology Code available for payment source | Admitting: Sleep Medicine

## 2021-08-01 MED ORDER — CYCLOBENZAPRINE HCL 10 MG PO TABS
10.0000 mg | ORAL_TABLET | Freq: Every day | ORAL | 3 refills | Status: DC
  Filled 2021-08-01: qty 30, 30d supply, fill #0
  Filled 2021-08-29: qty 30, 30d supply, fill #1
  Filled 2021-10-12: qty 30, 30d supply, fill #2
  Filled 2021-11-07: qty 30, 30d supply, fill #3

## 2021-08-02 ENCOUNTER — Other Ambulatory Visit (HOSPITAL_COMMUNITY): Payer: Self-pay

## 2021-08-03 ENCOUNTER — Other Ambulatory Visit (HOSPITAL_COMMUNITY): Payer: Self-pay

## 2021-08-03 DIAGNOSIS — L258 Unspecified contact dermatitis due to other agents: Secondary | ICD-10-CM | POA: Diagnosis not present

## 2021-08-03 MED ORDER — ALCLOMETASONE DIPROPIONATE 0.05 % EX CREA
TOPICAL_CREAM | CUTANEOUS | 3 refills | Status: DC
  Filled 2021-08-03: qty 60, 20d supply, fill #0

## 2021-08-04 ENCOUNTER — Other Ambulatory Visit (HOSPITAL_COMMUNITY): Payer: Self-pay

## 2021-08-07 ENCOUNTER — Other Ambulatory Visit (HOSPITAL_COMMUNITY): Payer: Self-pay

## 2021-08-08 ENCOUNTER — Other Ambulatory Visit (HOSPITAL_COMMUNITY): Payer: Self-pay

## 2021-08-14 ENCOUNTER — Ambulatory Visit (HOSPITAL_BASED_OUTPATIENT_CLINIC_OR_DEPARTMENT_OTHER): Payer: Self-pay | Admitting: Internal Medicine

## 2021-08-15 ENCOUNTER — Other Ambulatory Visit (HOSPITAL_COMMUNITY): Payer: Self-pay

## 2021-08-16 ENCOUNTER — Other Ambulatory Visit (HOSPITAL_COMMUNITY): Payer: Self-pay

## 2021-08-16 MED ORDER — LISINOPRIL-HYDROCHLOROTHIAZIDE 10-12.5 MG PO TABS
ORAL_TABLET | ORAL | 3 refills | Status: DC
  Filled 2021-08-16: qty 90, 90d supply, fill #0
  Filled 2021-11-07: qty 90, 90d supply, fill #1
  Filled 2022-02-05: qty 90, 90d supply, fill #2
  Filled 2022-04-30: qty 90, 90d supply, fill #3

## 2021-08-17 ENCOUNTER — Other Ambulatory Visit (HOSPITAL_COMMUNITY): Payer: Self-pay

## 2021-08-17 MED ORDER — CARESTART COVID-19 HOME TEST VI KIT
PACK | 0 refills | Status: DC
  Filled 2021-08-17: qty 4, 4d supply, fill #0

## 2021-08-22 ENCOUNTER — Other Ambulatory Visit (HOSPITAL_COMMUNITY): Payer: Self-pay

## 2021-08-23 ENCOUNTER — Other Ambulatory Visit (HOSPITAL_COMMUNITY): Payer: Self-pay

## 2021-08-29 ENCOUNTER — Other Ambulatory Visit (HOSPITAL_COMMUNITY): Payer: Self-pay

## 2021-08-29 MED ORDER — OMEPRAZOLE 40 MG PO CPDR
DELAYED_RELEASE_CAPSULE | ORAL | 1 refills | Status: DC
  Filled 2021-08-29: qty 90, 90d supply, fill #0
  Filled 2021-12-01: qty 90, 90d supply, fill #1

## 2021-09-04 ENCOUNTER — Other Ambulatory Visit (HOSPITAL_COMMUNITY): Payer: Self-pay

## 2021-09-06 ENCOUNTER — Other Ambulatory Visit (HOSPITAL_COMMUNITY): Payer: Self-pay

## 2021-09-08 ENCOUNTER — Other Ambulatory Visit (HOSPITAL_COMMUNITY): Payer: Self-pay

## 2021-09-08 DIAGNOSIS — M479 Spondylosis, unspecified: Secondary | ICD-10-CM | POA: Diagnosis not present

## 2021-09-08 DIAGNOSIS — Z79899 Other long term (current) drug therapy: Secondary | ICD-10-CM | POA: Diagnosis not present

## 2021-09-08 DIAGNOSIS — R7309 Other abnormal glucose: Secondary | ICD-10-CM | POA: Diagnosis not present

## 2021-09-08 DIAGNOSIS — Z0000 Encounter for general adult medical examination without abnormal findings: Secondary | ICD-10-CM | POA: Diagnosis not present

## 2021-09-08 DIAGNOSIS — I10 Essential (primary) hypertension: Secondary | ICD-10-CM | POA: Diagnosis not present

## 2021-09-08 DIAGNOSIS — B009 Herpesviral infection, unspecified: Secondary | ICD-10-CM | POA: Diagnosis not present

## 2021-09-08 DIAGNOSIS — E7800 Pure hypercholesterolemia, unspecified: Secondary | ICD-10-CM | POA: Diagnosis not present

## 2021-09-08 DIAGNOSIS — F321 Major depressive disorder, single episode, moderate: Secondary | ICD-10-CM | POA: Diagnosis not present

## 2021-09-13 ENCOUNTER — Other Ambulatory Visit (HOSPITAL_COMMUNITY): Payer: Self-pay

## 2021-09-14 ENCOUNTER — Other Ambulatory Visit (HOSPITAL_COMMUNITY): Payer: Self-pay

## 2021-09-14 DIAGNOSIS — H43812 Vitreous degeneration, left eye: Secondary | ICD-10-CM | POA: Diagnosis not present

## 2021-09-15 DIAGNOSIS — L570 Actinic keratosis: Secondary | ICD-10-CM | POA: Diagnosis not present

## 2021-09-15 DIAGNOSIS — X32XXXD Exposure to sunlight, subsequent encounter: Secondary | ICD-10-CM | POA: Diagnosis not present

## 2021-09-15 DIAGNOSIS — L820 Inflamed seborrheic keratosis: Secondary | ICD-10-CM | POA: Diagnosis not present

## 2021-09-15 DIAGNOSIS — L821 Other seborrheic keratosis: Secondary | ICD-10-CM | POA: Diagnosis not present

## 2021-09-22 ENCOUNTER — Other Ambulatory Visit (HOSPITAL_COMMUNITY): Payer: Self-pay | Admitting: Family Medicine

## 2021-09-22 ENCOUNTER — Other Ambulatory Visit: Payer: Self-pay | Admitting: Family Medicine

## 2021-09-22 DIAGNOSIS — Z1231 Encounter for screening mammogram for malignant neoplasm of breast: Secondary | ICD-10-CM

## 2021-09-26 ENCOUNTER — Ambulatory Visit
Admission: RE | Admit: 2021-09-26 | Discharge: 2021-09-26 | Disposition: A | Discharge status: Home / Self Care | Payer: PRIVATE HEALTH INSURANCE | Source: Ambulatory Visit | Attending: Family Medicine | Admitting: Family Medicine

## 2021-09-26 DIAGNOSIS — Z1231 Encounter for screening mammogram for malignant neoplasm of breast: Secondary | ICD-10-CM | POA: Diagnosis not present

## 2021-09-28 ENCOUNTER — Other Ambulatory Visit (HOSPITAL_COMMUNITY): Payer: Self-pay

## 2021-09-28 ENCOUNTER — Other Ambulatory Visit: Payer: Self-pay | Admitting: Urology

## 2021-09-28 MED ORDER — ESTRADIOL 0.1 MG/GM VA CREA
TOPICAL_CREAM | VAGINAL | 3 refills | Status: DC
  Filled 2021-09-28: qty 42.5, 90d supply, fill #0
  Filled 2022-05-14: qty 42.5, 90d supply, fill #1
  Filled 2022-07-30: qty 42.5, 90d supply, fill #2

## 2021-09-28 MED ORDER — HYDROXYZINE PAMOATE 25 MG PO CAPS
25.0000 mg | ORAL_CAPSULE | Freq: Every day | ORAL | 0 refills | Status: DC
  Filled 2021-09-28: qty 90, 90d supply, fill #0

## 2021-09-29 ENCOUNTER — Other Ambulatory Visit (HOSPITAL_COMMUNITY): Payer: Self-pay

## 2021-09-29 ENCOUNTER — Telehealth: Payer: Self-pay | Admitting: Pharmacist

## 2021-09-29 DIAGNOSIS — R5383 Other fatigue: Secondary | ICD-10-CM | POA: Diagnosis not present

## 2021-09-29 DIAGNOSIS — G8929 Other chronic pain: Secondary | ICD-10-CM | POA: Diagnosis not present

## 2021-09-29 DIAGNOSIS — M459 Ankylosing spondylitis of unspecified sites in spine: Secondary | ICD-10-CM | POA: Diagnosis not present

## 2021-09-29 DIAGNOSIS — Z6825 Body mass index (BMI) 25.0-25.9, adult: Secondary | ICD-10-CM | POA: Diagnosis not present

## 2021-09-29 DIAGNOSIS — Z1589 Genetic susceptibility to other disease: Secondary | ICD-10-CM | POA: Diagnosis not present

## 2021-09-29 DIAGNOSIS — M150 Primary generalized (osteo)arthritis: Secondary | ICD-10-CM | POA: Diagnosis not present

## 2021-09-29 DIAGNOSIS — Z79899 Other long term (current) drug therapy: Secondary | ICD-10-CM | POA: Diagnosis not present

## 2021-09-29 DIAGNOSIS — E663 Overweight: Secondary | ICD-10-CM | POA: Diagnosis not present

## 2021-09-29 DIAGNOSIS — R5382 Chronic fatigue, unspecified: Secondary | ICD-10-CM | POA: Diagnosis not present

## 2021-09-29 NOTE — Telephone Encounter (Signed)
Called patient to schedule an appointment for the [employer] [facility]. I was unable to reach the patient so I left a HIPAA-compliant message requesting that the patient return my call.   Luke Van Ausdall, PharmD, BCACP, CPP Clinical Pharmacist [facility] [phone]  

## 2021-09-30 ENCOUNTER — Other Ambulatory Visit (HOSPITAL_COMMUNITY): Payer: Self-pay

## 2021-10-05 ENCOUNTER — Other Ambulatory Visit (HOSPITAL_COMMUNITY): Payer: Self-pay

## 2021-10-09 ENCOUNTER — Other Ambulatory Visit (HOSPITAL_COMMUNITY): Payer: Self-pay

## 2021-10-10 ENCOUNTER — Other Ambulatory Visit: Payer: Self-pay

## 2021-10-10 ENCOUNTER — Telehealth: Payer: Self-pay | Admitting: Internal Medicine

## 2021-10-10 ENCOUNTER — Other Ambulatory Visit (HOSPITAL_COMMUNITY): Payer: Self-pay

## 2021-10-10 ENCOUNTER — Ambulatory Visit (INDEPENDENT_AMBULATORY_CARE_PROVIDER_SITE_OTHER): Payer: 59 | Admitting: Internal Medicine

## 2021-10-10 ENCOUNTER — Encounter (HOSPITAL_BASED_OUTPATIENT_CLINIC_OR_DEPARTMENT_OTHER): Payer: Self-pay | Admitting: Internal Medicine

## 2021-10-10 VITALS — BP 119/64 | HR 87 | Ht 64.0 in | Wt 155.6 lb

## 2021-10-10 DIAGNOSIS — E7849 Other hyperlipidemia: Secondary | ICD-10-CM

## 2021-10-10 DIAGNOSIS — E7801 Familial hypercholesterolemia: Secondary | ICD-10-CM | POA: Diagnosis not present

## 2021-10-10 DIAGNOSIS — M791 Myalgia, unspecified site: Secondary | ICD-10-CM | POA: Diagnosis not present

## 2021-10-10 DIAGNOSIS — T466X5A Adverse effect of antihyperlipidemic and antiarteriosclerotic drugs, initial encounter: Secondary | ICD-10-CM | POA: Diagnosis not present

## 2021-10-10 DIAGNOSIS — Z8249 Family history of ischemic heart disease and other diseases of the circulatory system: Secondary | ICD-10-CM

## 2021-10-10 MED ORDER — REPATHA SURECLICK 140 MG/ML ~~LOC~~ SOAJ
140.0000 mg | SUBCUTANEOUS | 3 refills | Status: DC
  Filled 2021-10-10: qty 2, 28d supply, fill #0
  Filled 2021-11-08: qty 2, 28d supply, fill #1
  Filled 2021-12-01: qty 2, 28d supply, fill #2
  Filled 2021-12-29: qty 2, 28d supply, fill #3

## 2021-10-10 NOTE — Progress Notes (Addendum)
LIPID CLINIC CONSULT NOTE  Chief Complaint:  Manage dyslipidemia  Primary Care Physician: [name], MD  Primary Cardiologist:  None  HPI:  Adrienne Campbell is a 61 y.o. female who is being seen today for the evaluation of dyslipidemia at the request of Koirala, Dibas, MD. 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  She has past medical history significant for dyslipidemia and has been intolerant to statins.  Most recent lipids show significantly elevated cholesterol with total 361, triglycerides 404, HDL 54 and LDL 221.  She also reports family history of coronary disease including her mom who had an MI and a stroke in her father who had two-vessel bypass.  Her paternal grandfather also died of an MI at an earlier age and her brother had an MI believe in his 106s.  Based on this there is a strong suspicion for familial hyperlipidemia.  According to her primary care provider she had previously been on rosuvastatin at various doses and atorvastatin including high intensity doses and was not able to tolerate the medications due to myalgias/severe leg cramps.  She reports a healthy diet that is pescatarian and tried to avoid fats.  PMHx:  Past Medical History:  Diagnosis Date   Anemia    Anxiety    Arthritis    Depression    Environmental and seasonal allergies    Heart murmur    during pregnancy   Hyperlipidemia    Hypertension    Insomnia    Pneumonia    hx of   PONV (postoperative nausea and vomiting)    Nausea, per pt. "felt loopy after hip surgery"   and made hallucinate    Past Surgical History:  Procedure Laterality Date   ABDOMINAL HYSTERECTOMY     COLONOSCOPY     TOTAL HIP ARTHROPLASTY Left 06/28/2014   Procedure: LEFT TOTAL HIP ARTHROPLASTY;  Surgeon: Kerin Salen, MD;  Location: [place];  Service: Orthopedics;  Laterality: Left;   TOTAL HIP ARTHROPLASTY Right 04/27/2019   Procedure: Right  Anterior Hip Arthroplasty;  Surgeon: Frederik Pear, MD;  Location: [facility];  Service: Orthopedics;  Laterality: Right;   TOTAL KNEE ARTHROPLASTY Right 08/11/2014   Procedure: RIGHT TOTAL KNEE ARTHROPLASTY;  Surgeon: Kerin Salen, MD;  Location: [place];  Service: Orthopedics;  Laterality: Right;   TRANSFORAMINAL LUMBAR INTERBODY FUSION (TLIF) WITH PEDICLE SCREW FIXATION 1 LEVEL Right 11/12/2018   Procedure: RIGHT-SIDED LUMBAR 4-5 TRANSFORAMINAL LUMBAR INTERBODY FUSION WITH INSTRUMENTATION AND ALLOGRAFT;  Surgeon: Phylliss Bob, MD;  Location: [place];  Service: Orthopedics;  Laterality: Right;    FAMHx:  Family History  Problem Relation Age of Onset   Stroke Mother    Heart attack Mother    Coronary artery disease Father    Heart attack Brother    Heart attack Maternal Grandfather     SOCHx:   reports that she has never smoked. She has never used smokeless tobacco. She reports current alcohol use. She reports that she does not use drugs.  ALLERGIES:  Allergies  Allergen Reactions   Nsaids Hives    Specifically meloxicam and ibuprofen reported   Onion Swelling    SWELLING REACTION UNSPECIFIED    Sulfa Antibiotics Swelling    Patient reports tongue swells   Fluticasone Other (See Comments)    ulcers   Oxycodone Nausea Only    Severe nausea; pt has to take with Zofran   Tramadol Hives  Hydrocodone-Acetaminophen Other (See Comments)    Hallucinations   Codeine Nausea And Vomiting   Gluten Meal Diarrhea    ROS: Pertinent items noted in HPI and remainder of comprehensive ROS otherwise negative.  HOME MEDS: Current Outpatient Medications on File Prior to Visit  Medication Sig Dispense Refill   acetaminophen (TYLENOL) 650 MG CR tablet 2 tablets as needed     acyclovir (ZOVIRAX) 200 MG capsule Take 400 mg by mouth 3 (three) times daily as needed (cold sores).     Adalimumab (HUMIRA) 40 MG/0.8ML PSKT Inject 0.8 ml under the skin once weekly. 4 each 3   alclomethasone (ACLOVATE) 0.05  % cream Apply topically to the affected areas up to 2 times a day as needed 60 g 3   celecoxib (CELEBREX) 200 MG capsule      Cetirizine HCl (ZYRTEC ALLERGY) 10 MG CAPS as needed.     cholecalciferol (VITAMIN D) 25 MCG (1000 UT) tablet Take 1,000 Units by mouth daily.     CITRUS BERGAMOT PO Take 1 tablet by mouth every morning.      Cyanocobalamin (VITAMIN B-12) 5000 MCG TBDP Take 5,000 mcg by mouth daily.     cyclobenzaprine (FLEXERIL) 10 MG tablet TAKE 1 TABLET BY MOUTH ONCE DAILY AT BEDTIME 30 tablet 3   Dapsone 5 % topical gel Apply 1 application topically daily as needed.     estradiol (ESTRACE) 0.1 MG/GM vaginal cream Apply  1/2 to 1 gram at the vaginal introitus at bedtime twice a week as directed. 42.5 g 3   Estradiol (VAGIFEM) 10 MCG TABS vaginal tablet Insert 1 tablet vaginally 2 times a week 24 tablet 3   hydrOXYzine (VISTARIL) 25 MG capsule TAKE 1 CAPSULE BY MOUTH EVERY NIGHT AT BEDTIME 90 capsule 3   lisinopril-hydrochlorothiazide (ZESTORETIC) 10-12.5 MG tablet TAKE 1 TABLET BY MOUTH ONCE DAILY 90 tablet 2   Omega-3 1000 MG CAPS Take 1,000 mg by mouth at bedtime.     omeprazole (PRILOSEC) 40 MG capsule Take 1 capsule by mouth 30 minutes before morning meal once daily 90 capsule 1   Polyethyl Glycol-Propyl Glycol 0.4-0.3 % SOLN Place 1 drop into both eyes 4 (four) times daily as needed (dry/irritated eyes).     venlafaxine XR (EFFEXOR-XR) 150 MG 24 hr capsule TAKE 1 CAPSULE BY MOUTH ONCE DAILY WITH FOOD 90 capsule 0   vitamin E 400 UNIT capsule Take 400 Units by mouth at bedtime.      aspirin EC 81 MG tablet Take 1 tablet (81 mg total) by mouth 2 (two) times daily. (Patient not taking: Reported on 10/10/2021) 60 tablet 0   No current facility-administered medications on file prior to visit.    LABS/IMAGING: No results found for this or any previous visit (from the past 48 hour(s)). No results found.  LIPID PANEL: No results found for: CHOL, TRIG, HDL, CHOLHDL, VLDL, LDLCALC,  LDLDIRECT  WEIGHTS: Wt Readings from Last 3 Encounters:  10/10/21 155 lb 9.6 oz (70.6 kg)  04/20/20 147 lb 14.9 oz (67.1 kg)  03/20/20 150 lb (68 kg)    VITALS: BP 119/64    Pulse 87    Ht 5\' 4"  (1.626 m)    Wt 155 lb 9.6 oz (70.6 kg)    SpO2 98%    BMI 26.71 kg/m   EXAM: General appearance: alert and no distress Neck: no carotid bruit, no JVD, and thyroid not enlarged, symmetric, no tenderness/mass/nodules Lungs: clear to auscultation bilaterally Heart: regular rate and rhythm, S1, S2  normal, no murmur, click, rub or gallop Abdomen: soft, non-tender; bowel sounds normal; no masses,  no organomegaly Extremities: extremities normal, atraumatic, no cyanosis or edema Pulses: 2+ and symmetric Skin: Skin color, texture, turgor normal. No rashes or lesions Neurologic: Grossly normal Psych: Pleasant  EKG: N/A  ASSESSMENT: Probable familial hyperlipidemia by Simon-Broome criteria, LDL greater than 190 Family history of multiple family members with premature coronary disease and high cholesterol Statin intolerance-myalgias/cramps  PLAN: 1.   [name] has likely familial hyperlipidemia with LDL greater than 190.  She has tried atorvastatin and rosuvastatin in the past at various doses and has been intolerant due to myalgias and leg cramps.  There is also family history of premature coronary disease including first-degree relatives and her brother which is highly suggestive of familial hyperlipidemia.  Given her LDL much greater than 190 she needs greater than 50% reduction to a threshold less than 70 per current guidelines.  This is best accomplished with a PCSK9 inhibitor.  We will pursue prior authorization for this and plan repeat lipids in about 3 to 4 months.  We will also obtain a calcium score and discussed genetic testing today as well.  Thanks again for the kind referral  Pixie Casino, MD, FACC, [place] Director of the Advanced  Lipid Disorders &  Cardiovascular Risk Reduction Clinic Diplomate of the American Board of Clinical Lipidology Attending Cardiologist  Direct Dial: [phone]   Fax: [phone]  Website:  [email or web address] Philisha Weinel 10/11/2021, 8:59 AM

## 2021-10-10 NOTE — Telephone Encounter (Signed)
Genetic test for dyslipidemia/ASCVD ordered ([facility]) Cheek swab completed in office Specimen and necessary paperwork mailed. ID: [id number]

## 2021-10-10 NOTE — Patient Instructions (Signed)
Medication Instructions:  Dr. Debara Pickett recommends Repatha (PCSK9) 140mg . This is an injectable cholesterol medication self-administered once every 14 days. This medication will likely need prior approval with your insurance company, which we will work on. If the medication is not approved initially, we may need to do an appeal with your insurance.   Administer medication in area of fatty tissue such as abdomen, outer thigh, back of upper arm - and rotate site with each injection Store medication in refrigerator until ready to administer - allow to sit at room temp for 30 mins - 1 hour prior to injection Dispose of medication in a SHARPS container - your pharmacy should be able to direct you on this and proper disposal   If you need a co-pay card for Repatha: [email or web address] >> paying for Repatha or red box that says "[place]" in top right If you need a co-pay card for Praluent: WedMap.it >> starting & paying for Praluent  Patient Assistance:   The PAN Foundation: https://www.panfoundation.org/disease-funds/hypercholesterolemia/ -- can sign up for wait list  The Health Well foundation offers assistance to help pay for medication copays.  They will cover copays for all cholesterol lowering meds, including statins, fibrates, omega-3 fish oils like Vascepa, ezetimibe, Repatha, Praluent, Nexletol, Nexlizet.  The cards are usually good for $2,500 or 12 months, whichever comes first. Go to healthwellfoundation.org Click on Apply Now Answer questions as to whom is applying (patient or representative) Your disease fund will be hypercholesterolemia - Medicare access They will ask questions about finances and which medications you are taking for cholesterol When you submit, the approval is usually within minutes.  You will need to print the card information from the site You will need to show this information to your pharmacy, they will bill your Medicare Part D plan first -then bill Health Well  --for the copay.   You can also call them at [phone], although the hold times can be quite long.   *If you need a refill on your cardiac medications before your next appointment, please call your pharmacy*  Lab Work: Your provider has recommended lab work [place]. Please have this collected at [facility] at [place]. The lab is open 8:00 am - 4:30 pm. Please avoid 12:00p - 1:00p for lunch hour. You do not need an appointment. Please go to [address] [place], [place] [address]. This is in the Primary Care office on the 3rd floor, let them know you are there for blood work and they will direct you to the lab.  If you have labs (blood work) drawn today and your tests are completely normal, you will receive your results only by: [place] (if you have MyChart) OR A paper copy in the mail If you have any lab test that is abnormal or we need to change your treatment, we will call you to review the results.  Testing/Procedures: Schedule Calcium Score  Follow-Up: At [facility], you and your health needs are our priority.  As part of our continuing mission to provide you with exceptional heart care, we have created designated Provider Care Teams.  These Care Teams include your primary Cardiologist (physician) and Advanced Practice Providers (APPs -  Physician Assistants and Nurse Practitioners) who all work together to provide you with the care you need, when you need it.  Your next appointment:   4 month(s) LIPID CLINIC  The format for your next appointment:   In Person  Provider: Dr. Debara Pickett

## 2021-10-11 ENCOUNTER — Encounter (HOSPITAL_BASED_OUTPATIENT_CLINIC_OR_DEPARTMENT_OTHER): Payer: Self-pay | Admitting: Internal Medicine

## 2021-10-11 DIAGNOSIS — H43812 Vitreous degeneration, left eye: Secondary | ICD-10-CM | POA: Diagnosis not present

## 2021-10-12 ENCOUNTER — Other Ambulatory Visit (HOSPITAL_COMMUNITY): Payer: Self-pay

## 2021-10-12 ENCOUNTER — Telehealth: Payer: Self-pay

## 2021-10-12 DIAGNOSIS — E7849 Other hyperlipidemia: Secondary | ICD-10-CM

## 2021-10-12 NOTE — Telephone Encounter (Signed)
PA submitted by [facility] Key: [id number]

## 2021-10-13 ENCOUNTER — Other Ambulatory Visit (HOSPITAL_COMMUNITY): Payer: Self-pay

## 2021-10-13 ENCOUNTER — Other Ambulatory Visit: Payer: Self-pay

## 2021-10-14 ENCOUNTER — Other Ambulatory Visit (HOSPITAL_COMMUNITY): Payer: Self-pay

## 2021-10-16 ENCOUNTER — Other Ambulatory Visit: Payer: Self-pay | Admitting: Pharmacist

## 2021-10-16 ENCOUNTER — Other Ambulatory Visit (HOSPITAL_COMMUNITY): Payer: Self-pay

## 2021-10-16 MED ORDER — CELECOXIB 200 MG PO CAPS
200.0000 mg | ORAL_CAPSULE | Freq: Two times a day (BID) | ORAL | 1 refills | Status: DC
  Filled 2021-10-16: qty 180, 90d supply, fill #0
  Filled 2022-01-16: qty 180, 90d supply, fill #1

## 2021-10-16 MED ORDER — HUMIRA 40 MG/0.8ML ~~LOC~~ PSKT: PREFILLED_SYRINGE | SUBCUTANEOUS | 5 refills | Status: DC

## 2021-10-16 MED ORDER — HUMIRA 40 MG/0.8ML ~~LOC~~ PSKT
PREFILLED_SYRINGE | SUBCUTANEOUS | 5 refills | Status: DC
  Filled 2021-10-16: qty 4, 28d supply, fill #0
  Filled 2021-11-06: qty 4, 28d supply, fill #1
  Filled 2021-12-04: qty 4, 28d supply, fill #2
  Filled 2022-01-03: qty 4, 28d supply, fill #3
  Filled 2022-02-08: qty 4, 28d supply, fill #4
  Filled 2022-03-12: qty 4, 28d supply, fill #5

## 2021-10-16 MED ORDER — HUMIRA 40 MG/0.8ML ~~LOC~~ PSKT
PREFILLED_SYRINGE | SUBCUTANEOUS | 5 refills | Status: DC
  Filled 2021-10-16: qty 4, 28d supply, fill #0

## 2021-10-17 ENCOUNTER — Other Ambulatory Visit (HOSPITAL_COMMUNITY): Payer: Self-pay

## 2021-10-17 MED ORDER — ACYCLOVIR 200 MG PO CAPS
ORAL_CAPSULE | ORAL | 0 refills | Status: DC
  Filled 2021-10-17: qty 360, 90d supply, fill #0

## 2021-10-18 ENCOUNTER — Telehealth (HOSPITAL_BASED_OUTPATIENT_CLINIC_OR_DEPARTMENT_OTHER): Payer: Self-pay | Admitting: Internal Medicine

## 2021-10-18 ENCOUNTER — Ambulatory Visit (HOSPITAL_BASED_OUTPATIENT_CLINIC_OR_DEPARTMENT_OTHER): Payer: 59 | Attending: Sleep Medicine | Admitting: Sleep Medicine

## 2021-10-18 ENCOUNTER — Other Ambulatory Visit (HOSPITAL_COMMUNITY): Payer: Self-pay

## 2021-10-18 VITALS — Ht 64.0 in | Wt 156.0 lb

## 2021-10-18 DIAGNOSIS — G4700 Insomnia, unspecified: Secondary | ICD-10-CM | POA: Diagnosis not present

## 2021-10-18 DIAGNOSIS — R0683 Snoring: Secondary | ICD-10-CM | POA: Diagnosis present

## 2021-10-18 DIAGNOSIS — G4710 Hypersomnia, unspecified: Secondary | ICD-10-CM

## 2021-10-18 DIAGNOSIS — G4733 Obstructive sleep apnea (adult) (pediatric): Secondary | ICD-10-CM

## 2021-10-18 NOTE — Telephone Encounter (Signed)
Received fax from [facility] requesting Prior Auth for [name]. Looks like PA was already sent in, but this one had a different Key.  Adrienne Campbell (Key: [id number]) Repatha SureClick 140MG /ML auto-injectors   Form MedImpact Prior [place] [phone] phone [phone] fax  Sent to Plan Today  Determination Wait for Determination Please wait for MedImpact 2017 to return a determination.

## 2021-10-23 NOTE — Telephone Encounter (Signed)
PA approved for Repatha form 10/14/2021 to 10/13/2022.

## 2021-10-24 NOTE — Telephone Encounter (Signed)
MyChart message sent to patient w/update on Repatha approval

## 2021-10-24 NOTE — Telephone Encounter (Signed)
Duplicate request. Med has been approved

## 2021-10-31 ENCOUNTER — Other Ambulatory Visit (HOSPITAL_COMMUNITY): Payer: Self-pay

## 2021-11-01 ENCOUNTER — Encounter: Payer: Self-pay | Admitting: Internal Medicine

## 2021-11-04 ENCOUNTER — Other Ambulatory Visit (HOSPITAL_COMMUNITY): Payer: Self-pay

## 2021-11-06 ENCOUNTER — Other Ambulatory Visit (HOSPITAL_COMMUNITY): Payer: Self-pay

## 2021-11-06 NOTE — Procedures (Signed)
° ° °  NAME: Adrienne Campbell DATE OF BIRTH:  1961/02/17 MEDICAL RECORD NUMBER [id number]  LOCATION: [facility]  PHYSICIAN:  D   DATE OF STUDY: 10/18/2021  SLEEP STUDY TYPE: Out of Center Sleep Test                REFERRING PHYSICIAN: Elmarie Mainland, MD  CLINICAL INFORMATION Adrienne Campbell is a 61 year old Female and was referred to the sleep center for evaluation of possible OSA.  MEDICATIONS Patient self administered medications include: N/A.  SLEEP STUDY TECHNIQUE A multi-channel overnight portable sleep study was performed. The channels recorded were: nasal and oral airflow, thoracic and abdominal respiratory movement, and oxygen saturation with a pulse oximetry. Snoring and body position were also monitored.  RECORDING SUMMARY The study was initiated at 10:09:45 PM and terminated at 6:31:03 AM. The total recorded time was 501.3 minutes. Time in bed was 359.5 minutes. RESPIRATORY PARAMETERS There were a total of apneacount apneas (80 obstructive, 0 mixed, 0 central) and 62 hypopneas. The apnea/hypopnea index (AHI) was 23.7 events/hour. The central sleep apnea index was 0 events/hour. Respiratory disturbances were associated with oxygen desaturation down to a nadir of 89%. The mean oxygen saturation during the study was 96%. The cumulative time under or = 88% oxygen saturation was 0.0 minutes.  CARDIAC DATA Mean heart rate during sleep was 80.5 bpm.  IMPRESSIONS - Moderate Obstructive Sleep apnea(OSA) DIAGNOSIS - Obstructive Sleep Apnea (G47.33) RECOMMENDATIONS - Therapeutic CPAP titration to determine optimal pressure required to alleviate sleep disordered breathing. - Avoid alcohol, sedatives and other CNS depressants that may worsen sleep apnea and disrupt normal sleep architecture. - Sleep hygiene should be reviewed to assess factors that may improve sleep quality. - Weight management and regular exercise should be initiated or  continued.    D  Diplomate, American Board of Sleep Medicine  ELECTRONICALLY SIGNED ON:  11/06/2021, 11:46 AM [place] PH: [phone]   FX: [phone] [place]

## 2021-11-07 ENCOUNTER — Other Ambulatory Visit (HOSPITAL_COMMUNITY): Payer: Self-pay

## 2021-11-07 MED ORDER — LISINOPRIL-HYDROCHLOROTHIAZIDE 10-12.5 MG PO TABS
ORAL_TABLET | ORAL | 3 refills | Status: DC
  Filled 2021-11-07: qty 90, 90d supply, fill #0

## 2021-11-08 ENCOUNTER — Other Ambulatory Visit (HOSPITAL_COMMUNITY): Payer: Self-pay

## 2021-11-17 ENCOUNTER — Other Ambulatory Visit (HOSPITAL_COMMUNITY): Payer: Self-pay

## 2021-11-29 ENCOUNTER — Ambulatory Visit (INDEPENDENT_AMBULATORY_CARE_PROVIDER_SITE_OTHER)
Admission: RE | Admit: 2021-11-29 | Discharge: 2021-11-29 | Disposition: A | Discharge status: Home / Self Care | Payer: Self-pay | Source: Ambulatory Visit | Attending: Internal Medicine | Admitting: Internal Medicine

## 2021-11-29 DIAGNOSIS — E7849 Other hyperlipidemia: Secondary | ICD-10-CM

## 2021-12-01 ENCOUNTER — Other Ambulatory Visit (HOSPITAL_COMMUNITY): Payer: Self-pay

## 2021-12-03 ENCOUNTER — Ambulatory Visit (HOSPITAL_COMMUNITY)
Admission: EM | Admit: 2021-12-03 | Discharge: 2021-12-03 | Disposition: A | Discharge status: Home / Self Care | Payer: 59 | Attending: Emergency Medicine | Admitting: Emergency Medicine

## 2021-12-03 ENCOUNTER — Emergency Department (HOSPITAL_COMMUNITY): Payer: 59

## 2021-12-03 ENCOUNTER — Emergency Department (HOSPITAL_BASED_OUTPATIENT_CLINIC_OR_DEPARTMENT_OTHER): Payer: 59 | Admitting: Certified Registered"

## 2021-12-03 ENCOUNTER — Emergency Department (HOSPITAL_COMMUNITY): Payer: 59 | Admitting: Certified Registered"

## 2021-12-03 ENCOUNTER — Encounter (HOSPITAL_COMMUNITY)
Admission: EM | Disposition: A | Discharge status: Home / Self Care | Payer: Self-pay | Source: Home / Self Care | Attending: Emergency Medicine

## 2021-12-03 ENCOUNTER — Encounter (HOSPITAL_COMMUNITY): Payer: Self-pay

## 2021-12-03 ENCOUNTER — Other Ambulatory Visit: Payer: Self-pay

## 2021-12-03 DIAGNOSIS — Y93E1 Activity, personal bathing and showering: Secondary | ICD-10-CM | POA: Insufficient documentation

## 2021-12-03 DIAGNOSIS — M810 Age-related osteoporosis without current pathological fracture: Secondary | ICD-10-CM | POA: Diagnosis not present

## 2021-12-03 DIAGNOSIS — Z96641 Presence of right artificial hip joint: Secondary | ICD-10-CM | POA: Diagnosis not present

## 2021-12-03 DIAGNOSIS — M459 Ankylosing spondylitis of unspecified sites in spine: Secondary | ICD-10-CM | POA: Diagnosis not present

## 2021-12-03 DIAGNOSIS — I10 Essential (primary) hypertension: Secondary | ICD-10-CM | POA: Insufficient documentation

## 2021-12-03 DIAGNOSIS — G709 Myoneural disorder, unspecified: Secondary | ICD-10-CM | POA: Diagnosis not present

## 2021-12-03 DIAGNOSIS — T84021A Dislocation of internal left hip prosthesis, initial encounter: Secondary | ICD-10-CM | POA: Insufficient documentation

## 2021-12-03 DIAGNOSIS — Z96651 Presence of right artificial knee joint: Secondary | ICD-10-CM | POA: Insufficient documentation

## 2021-12-03 DIAGNOSIS — W182XXA Fall in (into) shower or empty bathtub, initial encounter: Secondary | ICD-10-CM | POA: Insufficient documentation

## 2021-12-03 DIAGNOSIS — Z96642 Presence of left artificial hip joint: Secondary | ICD-10-CM | POA: Diagnosis not present

## 2021-12-03 DIAGNOSIS — F419 Anxiety disorder, unspecified: Secondary | ICD-10-CM | POA: Diagnosis not present

## 2021-12-03 DIAGNOSIS — M1990 Unspecified osteoarthritis, unspecified site: Secondary | ICD-10-CM | POA: Diagnosis not present

## 2021-12-03 DIAGNOSIS — S73005A Unspecified dislocation of left hip, initial encounter: Secondary | ICD-10-CM

## 2021-12-03 DIAGNOSIS — F32A Depression, unspecified: Secondary | ICD-10-CM | POA: Insufficient documentation

## 2021-12-03 DIAGNOSIS — M25552 Pain in left hip: Secondary | ICD-10-CM | POA: Diagnosis not present

## 2021-12-03 HISTORY — PX: HIP CLOSED REDUCTION: SHX983

## 2021-12-03 LAB — CBC WITH DIFFERENTIAL/PLATELET
Abs Immature Granulocytes: 0.05 10*3/uL (ref 0.00–0.07)
Basophils Absolute: 0 10*3/uL (ref 0.0–0.1)
Basophils Relative: 0 %
Eosinophils Absolute: 0.1 10*3/uL (ref 0.0–0.5)
Eosinophils Relative: 0 %
HCT: 34.9 % — ABNORMAL LOW (ref 36.0–46.0)
Hemoglobin: 11.7 g/dL — ABNORMAL LOW (ref 12.0–15.0)
Immature Granulocytes: 0 %
Lymphocytes Relative: 20 %
Lymphs Abs: 2.4 10*3/uL (ref 0.7–4.0)
MCH: 34.6 pg — ABNORMAL HIGH (ref 26.0–34.0)
MCHC: 33.5 g/dL (ref 30.0–36.0)
MCV: 103.3 fL — ABNORMAL HIGH (ref 80.0–100.0)
Monocytes Absolute: 0.8 10*3/uL (ref 0.1–1.0)
Monocytes Relative: 7 %
Neutro Abs: 8.8 10*3/uL — ABNORMAL HIGH (ref 1.7–7.7)
Neutrophils Relative %: 73 %
Platelets: 326 10*3/uL (ref 150–400)
RBC: 3.38 MIL/uL — ABNORMAL LOW (ref 3.87–5.11)
RDW: 11.6 % (ref 11.5–15.5)
WBC: 12.2 10*3/uL — ABNORMAL HIGH (ref 4.0–10.5)
nRBC: 0 % (ref 0.0–0.2)

## 2021-12-03 LAB — BASIC METABOLIC PANEL
Anion gap: 6 (ref 5–15)
BUN: 10 mg/dL (ref 8–23)
CO2: 25 mmol/L (ref 22–32)
Calcium: 8.6 mg/dL — ABNORMAL LOW (ref 8.9–10.3)
Chloride: 105 mmol/L (ref 98–111)
Creatinine, Ser: 0.62 mg/dL (ref 0.44–1.00)
GFR, Estimated: >60 mL/min (ref >60)
Glucose, Bld: 104 mg/dL — ABNORMAL HIGH (ref 70–99)
Potassium: 4.5 mmol/L (ref 3.5–5.1)
Sodium: 136 mmol/L (ref 135–145)

## 2021-12-03 SURGERY — CLOSED REDUCTION, HIP
Anesthesia: General | Site: Hip | Laterality: Left

## 2021-12-03 MED ORDER — PROPOFOL 10 MG/ML IV BOLUS
INTRAVENOUS | Status: AC
  Filled 2021-12-03: qty 20

## 2021-12-03 MED ORDER — ACETAMINOPHEN 500 MG PO TABS: 1000.0000 mg | ORAL_TABLET | Freq: Four times a day (QID) | ORAL | Status: DC

## 2021-12-03 MED ORDER — HYDROMORPHONE HCL 1 MG/ML IJ SOLN
0.5000 mg | Freq: Once | INTRAMUSCULAR | Status: AC
Start: 2021-12-03 — End: 2021-12-03
  Administered 2021-12-03: 0.5 mg via INTRAVENOUS
  Filled 2021-12-03: qty 1

## 2021-12-03 MED ORDER — HYDROMORPHONE HCL 1 MG/ML IJ SOLN
1.0000 mg | Freq: Once | INTRAMUSCULAR | Status: AC
  Administered 2021-12-03: 1 mg via INTRAVENOUS
  Filled 2021-12-03: qty 1

## 2021-12-03 MED ORDER — MIDAZOLAM HCL 2 MG/2ML IJ SOLN
INTRAMUSCULAR | Status: AC
  Filled 2021-12-03: qty 2

## 2021-12-03 MED ORDER — ONDANSETRON HCL 4 MG/2ML IJ SOLN
4.0000 mg | Freq: Once | INTRAMUSCULAR | Status: AC
  Administered 2021-12-03: 4 mg via INTRAVENOUS
  Filled 2021-12-03: qty 2

## 2021-12-03 MED ORDER — OXYCODONE HCL 5 MG PO TABS: 5.0000 mg | ORAL_TABLET | ORAL | Status: DC | PRN

## 2021-12-03 MED ORDER — SALINE SPRAY 0.65 % NA SOLN
1.0000 | Freq: Once | NASAL | Status: AC
Start: 2021-12-03 — End: 2021-12-03
  Administered 2021-12-03: 1 via NASAL
  Filled 2021-12-03: qty 44

## 2021-12-03 MED ORDER — LIDOCAINE 2% (20 MG/ML) 5 ML SYRINGE
INTRAMUSCULAR | Status: AC
  Filled 2021-12-03: qty 5

## 2021-12-03 MED ORDER — PROPOFOL 10 MG/ML IV BOLUS
INTRAVENOUS | Status: AC | PRN
Start: 2021-12-03 — End: 2021-12-03
  Administered 2021-12-03: 50 mg via INTRAVENOUS
  Administered 2021-12-03 (×2): 10 mg via INTRAVENOUS
  Administered 2021-12-03: 20 mg via INTRAVENOUS
  Administered 2021-12-03: 10 mg via INTRAVENOUS
  Administered 2021-12-03: 20 mg via INTRAVENOUS
  Administered 2021-12-03: 10 mg via INTRAVENOUS
  Administered 2021-12-03: 20 mg via INTRAVENOUS
  Administered 2021-12-03: 10 mg via INTRAVENOUS
  Administered 2021-12-03 (×2): 20 mg via INTRAVENOUS

## 2021-12-03 MED ORDER — METOCLOPRAMIDE HCL 5 MG PO TABS: 5.0000 mg | ORAL_TABLET | Freq: Three times a day (TID) | ORAL | Status: DC | PRN

## 2021-12-03 MED ORDER — MIDAZOLAM HCL 2 MG/2ML IJ SOLN
INTRAMUSCULAR | Status: DC | PRN
  Administered 2021-12-03: 1 mg via INTRAVENOUS

## 2021-12-03 MED ORDER — SODIUM CHLORIDE 0.9 % IV BOLUS
1000.0000 mL | Freq: Once | INTRAVENOUS | Status: AC
  Administered 2021-12-03: 1000 mL via INTRAVENOUS

## 2021-12-03 MED ORDER — ONDANSETRON HCL 4 MG/2ML IJ SOLN: 4.0000 mg | Freq: Four times a day (QID) | INTRAMUSCULAR | Status: DC | PRN

## 2021-12-03 MED ORDER — PROPOFOL 1000 MG/100ML IV EMUL
INTRAVENOUS | Status: AC
  Filled 2021-12-03: qty 100

## 2021-12-03 MED ORDER — DEXAMETHASONE SODIUM PHOSPHATE 10 MG/ML IJ SOLN
INTRAMUSCULAR | Status: AC
  Filled 2021-12-03: qty 1

## 2021-12-03 MED ORDER — FENTANYL CITRATE (PF) 250 MCG/5ML IJ SOLN
INTRAMUSCULAR | Status: DC | PRN
  Administered 2021-12-03: 100 ug via INTRAVENOUS

## 2021-12-03 MED ORDER — OXYCODONE HCL 5 MG PO TABS: 10.0000 mg | ORAL_TABLET | ORAL | Status: DC | PRN

## 2021-12-03 MED ORDER — HYDROMORPHONE HCL 1 MG/ML IJ SOLN: 0.5000 mg | INTRAMUSCULAR | Status: DC | PRN

## 2021-12-03 MED ORDER — DIAZEPAM 5 MG/ML IJ SOLN
2.5000 mg | Freq: Once | INTRAMUSCULAR | Status: AC
Start: 2021-12-03 — End: 2021-12-03
  Administered 2021-12-03: 2.5 mg via INTRAVENOUS
  Filled 2021-12-03: qty 2

## 2021-12-03 MED ORDER — ACETAMINOPHEN 325 MG PO TABS: 325.0000 mg | ORAL_TABLET | Freq: Four times a day (QID) | ORAL | Status: DC | PRN

## 2021-12-03 MED ORDER — LACTATED RINGERS IV SOLN: INTRAVENOUS | Status: DC

## 2021-12-03 MED ORDER — ROCURONIUM BROMIDE 10 MG/ML (PF) SYRINGE
PREFILLED_SYRINGE | INTRAVENOUS | Status: AC
  Filled 2021-12-03: qty 10

## 2021-12-03 MED ORDER — ONDANSETRON HCL 4 MG/2ML IJ SOLN
INTRAMUSCULAR | Status: AC
  Filled 2021-12-03: qty 2

## 2021-12-03 MED ORDER — PROPOFOL 10 MG/ML IV BOLUS
1.0000 mg/kg | Freq: Once | INTRAVENOUS | Status: DC
Start: 2021-12-03 — End: 2021-12-03
  Filled 2021-12-03: qty 20

## 2021-12-03 MED ORDER — ONDANSETRON HCL 4 MG PO TABS: 4.0000 mg | ORAL_TABLET | Freq: Four times a day (QID) | ORAL | Status: DC | PRN

## 2021-12-03 MED ORDER — CHLORHEXIDINE GLUCONATE 0.12 % MT SOLN
OROMUCOSAL | Status: AC
  Filled 2021-12-03: qty 15

## 2021-12-03 MED ORDER — METOCLOPRAMIDE HCL 5 MG/ML IJ SOLN: 5.0000 mg | Freq: Three times a day (TID) | INTRAMUSCULAR | Status: DC | PRN

## 2021-12-03 MED ORDER — FENTANYL CITRATE (PF) 100 MCG/2ML IJ SOLN: 25.0000 ug | INTRAMUSCULAR | Status: DC | PRN

## 2021-12-03 MED ORDER — CHLORHEXIDINE GLUCONATE 0.12 % MT SOLN: 15.0000 mL | Freq: Once | OROMUCOSAL | Status: DC

## 2021-12-03 MED ORDER — DOCUSATE SODIUM 100 MG PO CAPS: 100.0000 mg | ORAL_CAPSULE | Freq: Two times a day (BID) | ORAL | Status: DC

## 2021-12-03 MED ORDER — ORAL CARE MOUTH RINSE: 15.0000 mL | Freq: Once | OROMUCOSAL | Status: DC

## 2021-12-03 MED ORDER — FENTANYL CITRATE (PF) 250 MCG/5ML IJ SOLN
INTRAMUSCULAR | Status: AC
  Filled 2021-12-03: qty 5

## 2021-12-03 MED ORDER — LACTATED RINGERS IV SOLN: INTRAVENOUS | Status: DC | PRN

## 2021-12-03 MED ORDER — PROPOFOL 10 MG/ML IV BOLUS
INTRAVENOUS | Status: DC | PRN
  Administered 2021-12-03: 30 mg via INTRAVENOUS
  Administered 2021-12-03: 20 mg via INTRAVENOUS

## 2021-12-03 SURGICAL SUPPLY — 2 items
IMMOBILIZER KNEE 20 (SOFTGOODS) ×2 IMPLANT
IMMOBILIZER KNEE 20 THIGH 36 (SOFTGOODS) IMPLANT

## 2021-12-03 NOTE — ED Notes (Signed)
Ortho tech at bedside 

## 2021-12-03 NOTE — ED Provider Notes (Signed)
?[facility] ?Provider Note ? ? ?CSN: [id number] ?Arrival date & time: 12/03/21  0901 ? ?  ? ?History ? ?Chief Complaint  ?Patient presents with  ? Hip Pain  ?  L hip replacement dislocation  ? ? ?Adrienne Campbell is a 60 y.o. female with a past medical history of ankylosing spondylitis, osteoarthritis and bilateral hip replacements and right knee replacement presenting today with a concern of a dislocation of her left hip replacement.  She says that she had her hip replaced "sometime ago" and that there has been 1 other incident of dislocation of the left hip.  Today she was stepping out of the shower and thinks she twisted the wrong way and felt the hip pop out of place.  Said that she had immediate 10 out of 10 pain and called 911.  No numbness or tingling, no back pain, no concerns with her right hip.  Received fentanyl in route and said that it took 4 rounds to help her pain ? ?Hip Pain ? ? ?  ? ?Home Medications ?Prior to Admission medications   ?Medication Sig Start Date End Date Taking? Authorizing Provider  ?acetaminophen (TYLENOL) 650 MG CR tablet 2 tablets as needed    [provider]  ?acyclovir (ZOVIRAX) 200 MG capsule Take 400 mg by mouth 3 (three) times daily as needed (cold sores).    [provider]  ?acyclovir (ZOVIRAX) 200 MG capsule Take 2 capsules by mouth 3 times a day for 5 days for recurrence. 10/17/21     ?Adalimumab (HUMIRA) 40 MG/0.8ML PSKT 0.8 mL Subcutaneous once weekly 30 days 10/16/21   Tresa Garter, MD  ?alclomethasone (ACLOVATE) 0.05 % cream Apply topically to the affected areas up to 2 times a day as needed 08/03/21     ?aspirin EC 81 MG tablet Take 1 tablet (81 mg total) by mouth 2 (two) times daily. ?Patient not taking: Reported on 10/10/2021 04/27/19   Leighton Parody, PA-C  ?celecoxib (CELEBREX) 200 MG capsule     [provider]  ?celecoxib (CELEBREX) 200 MG capsule Take 1 capsule by mouth 2 times a day  10/16/21     ?Cetirizine HCl (ZYRTEC ALLERGY) 10 MG CAPS as needed.    [provider]  ?cholecalciferol (VITAMIN D) 25 MCG (1000 UT) tablet Take 1,000 Units by mouth daily.    [provider]  ?CITRUS BERGAMOT PO Take 1 tablet by mouth every morning.     [provider]  ?Cyanocobalamin (VITAMIN B-12) 5000 MCG TBDP Take 5,000 mcg by mouth daily.    [provider]  ?cyclobenzaprine (FLEXERIL) 10 MG tablet TAKE 1 TABLET BY MOUTH ONCE DAILY AT BEDTIME 08/01/21     ?Dapsone 5 % topical gel Apply 1 application topically daily as needed. 02/26/19   [provider]  ?estradiol (ESTRACE) 0.1 MG/GM vaginal cream Apply  1/2 to 1 gram at the vaginal introitus at bedtime twice a week as directed. 09/28/21     ?Estradiol (VAGIFEM) 10 MCG TABS vaginal tablet Place 1 tablet (10 mcg total) vaginally 2 (two) times a week. 05/22/21     ?Evolocumab (REPATHA SURECLICK) 938 MG/ML SOAJ Inject 140 mg into the skin every 14 (fourteen) days. 10/10/21   Hilty, Nadean Corwin, MD  ?hydrOXYzine (VISTARIL) 25 MG capsule TAKE 1 CAPSULE BY MOUTH EVERY NIGHT AT BEDTIME 10/17/20 10/17/21  Koirala, Dibas, MD  ?lisinopril-hydrochlorothiazide (ZESTORETIC) 10-12.5 MG tablet TAKE 1 TABLET BY MOUTH ONCE DAILY 11/21/20 11/21/21  Koirala, Dibas, MD  ?lisinopril-hydrochlorothiazide (ZESTORETIC) 10-12.5 MG tablet Take 1 tablet by daily 08/16/21     ?lisinopril-hydrochlorothiazide (ZESTORETIC) 10-12.5 MG tablet Take 1 tablet by mouth once a day. 11/07/21     ?Omega-3 1000 MG CAPS Take 1,000 mg by mouth at bedtime.    [provider]  ?omeprazole (PRILOSEC) 40 MG capsule Take 1 capsule by mouth 30 minutes before morning meal once daily 08/29/21     ?Polyethyl Glycol-Propyl Glycol 0.4-0.3 % SOLN Place 1 drop into both eyes 4 (four) times daily as needed (dry/irritated eyes).    [provider]  ?venlafaxine XR (EFFEXOR-XR) 150 MG 24 hr capsule TAKE 1 CAPSULE BY MOUTH ONCE DAILY WITH FOOD 07/17/21     ?vitamin E  400 UNIT capsule Take 400 Units by mouth at bedtime.     [provider]  ?   ? ?Allergies    ?Nsaids, Onion, Sulfa antibiotics, Fluticasone, Oxycodone, Tramadol, Hydrocodone-acetaminophen, Codeine, and Gluten meal   ? ?Review of Systems   ?Review of Systems  ?Musculoskeletal:  Positive for arthralgias and myalgias. Negative for back pain.  ?Neurological:  Negative for weakness and numbness.  ? ?Physical Exam ?Updated Vital Signs ?BP (!) 151/90 (BP Location: Right Arm)   Pulse 77   Temp (!) 97.4 ?F (36.3 ?C) (Oral)   Resp 12   Ht '5\' 4"'$  (1.626 m)   Wt 68 kg   SpO2 94%   BMI 25.75 kg/m?  ?Physical Exam ?Vitals and nursing note reviewed.  ?Constitutional:   ?   Appearance: Normal appearance.  ?HENT:  ?   Head: Normocephalic and atraumatic.  ?Eyes:  ?   General: No scleral icterus. ?   Conjunctiva/sclera: Conjunctivae normal.  ?Pulmonary:  ?   Effort: Pulmonary effort is normal. No respiratory distress.  ?Musculoskeletal:  ?   Comments: Shortened and internally rotated left lower extremity.  Neurovascularly intact, 2+ DP pulses and normal cap refill.  Unable to move her left lower extremity above the ankle.  Well-healed scars bilaterally from total hip replacements.  ?Skin: ?   Findings: No rash.  ?Neurological:  ?   Mental Status: She is alert.  ?Psychiatric:     ?   Mood and Affect: Mood normal.  ? ? ?ED Results / Procedures / Treatments   ?Labs ?(all labs ordered are listed, but only abnormal results are displayed) ?Labs Reviewed - No data to display ? ?EKG ?None ? ?Radiology ?DG Hip Unilat W or Wo Pelvis 2-3 Views Left ? ?Result Date: 12/03/2021 ?CLINICAL DATA:  Hip dislocation. EXAM: DG HIP (WITH OR WITHOUT PELVIS) 2-3V LEFT COMPARISON:  12/14/2009. FINDINGS: Three view study. Status post bilateral hip replacement. Femoral component of the left hip arthroplasty is dislocated superiorly. No evidence for periprosthetic fracture. Bones are diffusely demineralized. IMPRESSION: Superior dislocation of the  femoral component of the left hip arthroplasty. Electronically Signed   By: Misty Stanley M.D.   On: 12/03/2021 10:50   ? ?Procedures ?[facility] ? ?Date/Time: 12/03/2021 2:09 PM ?Performed by: Rhae Hammock, PA-C ?Authorized by: Rhae Hammock, PA-C  ? ?Consent:  ?  Consent obtained:  Verbal ?  Consent given by:  Patient ?  Risks discussed:  Irreducible dislocation, vascular damage, nerve damage and fracture ?  Alternatives discussed:  ReferralInjury location: hip ?Location details: left hip ?Injury type: dislocation ?Dislocation type: posterior ?Spontaneous dislocation: yes ?Pre-procedure neurovascular assessment: neurovascularly intact ?Pre-procedure distal perfusion: normal ?Pre-procedure neurological function: normal ?Pre-procedure range of motion: reduced ? ?Anesthesia: ?  Local anesthesia used: no ? ?Patient sedated: Yes. Refer to sedation procedure documentation for details of sedation. ?Manipulation performed: no ?Comments: Unable to fully reduce the hip.  Ortho to take patient to the OR ? ?  ?Patient remained in normal sinus rhythm with a normal rate throughout her evaluation on the monitor.  This did not change during procedure. ? ?Medications Ordered in ED ?Medications  ?propofol (DIPRIVAN) 10 mg/mL bolus/IV push 68 mg (0 mg Intravenous See Procedure Record 12/03/21 1315)  ?propofol (DIPRIVAN) 1000 MG/100ML infusion (  See Procedure Record 12/03/21 1316)  ?sodium chloride (OCEAN) 0.65 % nasal spray 1 spray (has no administration in time range)  ?HYDROmorphone (DILAUDID) injection 0.5 mg (0.5 mg Intravenous Given 12/03/21 0929)  ?HYDROmorphone (DILAUDID) injection 1 mg (1 mg Intravenous Given 12/03/21 1034)  ?ondansetron [facility]) injection 4 mg (4 mg Intravenous Given 12/03/21 1207)  ?sodium chloride 0.9 % bolus 1,000 mL (0 mLs Intravenous Stopped 12/03/21 1327)  ?propofol (DIPRIVAN) 10 mg/mL bolus/IV push (20 mg Intravenous Given 12/03/21 1303)  ?diazepam (VALIUM) injection 2.5 mg (2.5 mg Intravenous  Given 12/03/21 1400)  ? ? ?ED Course/ Medical Decision Making/ A&P ?Clinical Course as of 12/03/21 1409  ?Nancy Fetter Dec 03, 2021  ?1354 I spoke with Dr. Tamera Punt with orthopedics who plans to consent the patient for th

## 2021-12-03 NOTE — Sedation Documentation (Signed)
Pt very tearful and upset. Provider at bedside talking w/ pt. ?

## 2021-12-03 NOTE — Anesthesia Postprocedure Evaluation (Signed)
Anesthesia Post Note ? ?Patient: Adrienne Campbell ? ?Procedure(s) Performed: CLOSED REDUCTION HIP (Left: Hip) ? ?  ? ?Patient location during evaluation: PACU ?Anesthesia Type: General ?Level of consciousness: awake and alert ?Pain management: pain level controlled ?Vital Signs Assessment: post-procedure vital signs reviewed and stable ?Respiratory status: spontaneous breathing, nonlabored ventilation, respiratory function stable and patient connected to nasal cannula oxygen ?Cardiovascular status: blood pressure returned to baseline and stable ?Postop Assessment: no apparent nausea or vomiting ?Anesthetic complications: no ? ? ?No notable events documented. ? ?Last Vitals:  ?Vitals:  ? 12/03/21 1720 12/03/21 1735  ?BP: 137/77 137/81  ?Pulse: 91 92  ?Resp: 14 16  ?Temp: (!) 36.2 ?C (!) 36.3 ?C  ?SpO2: 98% 93%  ?  ?Last Pain:  ?Vitals:  ? 12/03/21 1735  ?TempSrc:   ?PainSc: 0-No pain  ? ? ?  ?  ?  ?  ?  ?  ? ?P[place] S ? ? ? ? ?

## 2021-12-03 NOTE — ED Notes (Signed)
Consent for conscious sedation and L hip reduction obtained ?

## 2021-12-03 NOTE — ED Triage Notes (Signed)
Pt arrived to ED via EMS from home w/ c/o her L hip replacement dislocating. Pt has had this happen once before. Pt was taking a shower this morning when her hip "popped" out. No falls reported. 20g L AC, 248mg fentanyl given total. VSS w/ EMS ?

## 2021-12-03 NOTE — Transfer of Care (Signed)
Immediate Anesthesia Transfer of Care Note ? ?Patient: Adrienne Campbell ? ?Procedure(s) Performed: CLOSED REDUCTION HIP (Left: Hip) ? ?Patient Location: PACU ? ?Anesthesia Type:General ? ?Level of Consciousness: drowsy ? ?Airway & Oxygen Therapy: Patient Spontanous Breathing ? ?Post-op Assessment: Report given to RN and Post -op Vital signs reviewed and stable ? ?Post vital signs: Reviewed and stable ? ?Last Vitals:  ?Vitals Value Taken Time  ?BP 137/77 12/03/21 1719  ?Temp    ?Pulse 88 12/03/21 1723  ?Resp 15 12/03/21 1723  ?SpO2 95 % 12/03/21 1723  ?Vitals shown include unvalidated device data. ? ?Last Pain:  ?Vitals:  ? 12/03/21 1111  ?TempSrc:   ?PainSc: 7   ?   ? ?  ? ?Complications: No notable events documented. ?

## 2021-12-03 NOTE — ED Notes (Signed)
Pt transported to Xray at this time.

## 2021-12-03 NOTE — Op Note (Signed)
Procedure(s): ?CLOSED REDUCTION HIP Procedure Note ? ?Adrienne Campbell ?female ?61 y.o. ?12/03/2021 ? ?Preoperative diagnosis: Dislocated left total hip arthroplasty ? ?Postoperative diagnosis: Same ? ?Procedure(s) and Anesthesia Type: ?   * CLOSED REDUCTION HIP - General ? ?Surgeon(s) and Role: ?   Tania Ade, MD - Primary ? ? ?Indications:  61 y.o. female s/p left total hip replacement in 2015.  She slipped getting out of the shower today and twisted her leg and felt a sudden pop.  She was found to have superior dislocation of the left total hip. ? ?Surgeon: Rhae Hammock  ? ? ? ?Anesthesia: General mask inhalational anesthesia ? ? ? ?Procedure Detail ? ?CLOSED REDUCTION HIP ? ?Findings: The hip was reduced and noted to be stable after reduction ? ?Estimated Blood Loss:   None ?        ?Drains: none ? ?Blood Given: none  ?        ?Specimens: none ?       ?Complications:  * No complications entered in OR log * ?        ?Disposition: PACU - hemodynamically stable. ?        ?Condition: stable ?   ?Procedure: ? ?The patient was identified in the preoperative  ?holding area where I personally marked the operative site after  ?verifying site side and procedure with the patient. She was taken back  ?to the operating room where general anesthesia was induced without  ?Complication.  ?She was kept in the supine position.  Manual compression was placed on the ASIS while the hip was reduced with a anteriorly directed force of the knee flexed.  Satisfactory clunk was achieved and fluoroscopic imaging was used to demonstrate appropriate reduction.  The hip was taken through range of motion with no propensity for recurrent dislocation.  The patient was allowed to awaken from anesthesia transferred back to the stretcher and taken to the recovery in stable condition with a knee immobilizer. ? ?Postoperative plan: She will be discharged home today with family and will follow-up with Dr. Mayer Camel this week for further  evaluation. ?

## 2021-12-03 NOTE — Progress Notes (Addendum)
Orthopedic Tech Progress Note ?Patient Details:  ?Breckin Zafar ?09-16-1960 ?[id number] ? ?Assisted Dr. Doren Custard and Mervyn Gay, PA-C with a closed reduction of the pt's L hip under conscious sedation. Reduction of the joint was unnsuccesful, pt heading to the OR with Dr. Tamera Punt. ? ?Patient ID: Lysle Dingwall, female   DOB: June 07, 1961, 61 y.o.   MRN: [id number] ? ?Jamieson Lisa Jeri Modena ?12/03/2021, 4:37 PM ? ?

## 2021-12-03 NOTE — Anesthesia Preprocedure Evaluation (Addendum)
Anesthesia Evaluation  ?Patient identified by MRN, date of birth, ID band ?Patient awake ? ? ? ?Reviewed: ?Allergy & Precautions, H&P , NPO status , Patient's Chart, lab work & pertinent test results ? ?History of Anesthesia Complications ?(+) PONV and history of anesthetic complications ? ?Airway ?Mallampati: II ? ? ?Neck ROM: full ? ? ? Dental ?  ?Pulmonary ? ?  ?breath sounds clear to auscultation ? ? ? ? ? ? Cardiovascular ?hypertension,  ?Rhythm:regular Rate:Normal ? ? ?  ?Neuro/Psych ?PSYCHIATRIC DISORDERS Anxiety Depression  Neuromuscular disease   ? GI/Hepatic ?  ?Endo/Other  ? ? Renal/GU ?  ? ?  ?Musculoskeletal ? ?(+) Arthritis ,  ? Abdominal ?  ?Peds ? Hematology ?  ?Anesthesia Other Findings ? ? Reproductive/Obstetrics ? ?  ? ? ? ? ? ? ? ? ? ? ? ? ? ?  ?  ? ? ? ? ? ? ? ? ?Anesthesia Physical ?Anesthesia Plan ? ?ASA: 2 ? ?Anesthesia Plan: General  ? ?Post-op Pain Management:   ? ?Induction: Intravenous ? ?PONV Risk Score and Plan: 4 or greater and Propofol infusion and Treatment may vary due to age or medical condition ? ?Airway Management Planned: Mask ? ?Additional Equipment:  ? ?Intra-op Plan:  ? ?Post-operative Plan:  ? ?Informed Consent: I have reviewed the patients History and Physical, chart, labs and discussed the procedure including the risks, benefits and alternatives for the proposed anesthesia with the patient or authorized representative who has indicated his/her understanding and acceptance.  ? ? ? ?Dental advisory given ? ?Plan Discussed with: CRNA, Anesthesiologist and Surgeon ? ?Anesthesia Plan Comments:   ? ? ? ? ? ? ?Anesthesia Quick Evaluation ? ?

## 2021-12-03 NOTE — Discharge Instructions (Signed)
Discharge Instructions  ? ?You can put full weight on the left leg.  ?Wear the knee immobilizer at all times. ?You can apply ice to the hip and you may use Tylenol for pain.  ?Call Dr Damita Dunnings office tomorrow to make a follow up appointment as he will likely want to see you this week.  ? ? ? ?Please call [phone] during normal business hours or [phone] after hours for any problems.  ? ?*Please note that pain medications will not be refilled after hours or on weekends. ? ? ?  ?

## 2021-12-03 NOTE — H&P (Signed)
Adrienne Campbell is an 61 y.o. female.   ?Chief Complaint: Left hip dislocation ?HPI: Status post left total hip arthroplasty in 2015 by Dr. Mayer Camel.  She had 1 dislocation in 2019 but has been stable since then.  She was stepping out of the bathtub and slipped.  She felt a sudden sharp pain in her hip and knew that her hip was dislocated.  An attempt was made in the emergency department for closed reduction but they were unable to get adequate anesthesia. ? ?Past Medical History:  ?Diagnosis Date  ? Anemia   ? Anxiety   ? Arthritis   ? Depression   ? Environmental and seasonal allergies   ? Heart murmur   ? during pregnancy  ? Hyperlipidemia   ? Hypertension   ? Insomnia   ? Pneumonia   ? hx of  ? PONV (postoperative nausea and vomiting)   ? Nausea, per pt. "felt loopy after hip surgery"   and made hallucinate  ? ? ?Past Surgical History:  ?Procedure Laterality Date  ? ABDOMINAL HYSTERECTOMY    ? COLONOSCOPY    ? TOTAL HIP ARTHROPLASTY Left 06/28/2014  ? Procedure: LEFT TOTAL HIP ARTHROPLASTY;  Surgeon: Kerin Salen, MD;  Location: [place];  Campbell: Orthopedics;  Laterality: Left;  ? TOTAL HIP ARTHROPLASTY Right 04/27/2019  ? Procedure: Right Anterior Hip Arthroplasty;  Surgeon: Frederik Pear, MD;  Location: [facility];  Campbell: Orthopedics;  Laterality: Right;  ? TOTAL KNEE ARTHROPLASTY Right 08/11/2014  ? Procedure: RIGHT TOTAL KNEE ARTHROPLASTY;  Surgeon: Kerin Salen, MD;  Location: [place];  Campbell: Orthopedics;  Laterality: Right;  ? TRANSFORAMINAL LUMBAR INTERBODY FUSION (TLIF) WITH PEDICLE SCREW FIXATION 1 LEVEL Right 11/12/2018  ? Procedure: RIGHT-SIDED LUMBAR 4-5 TRANSFORAMINAL LUMBAR INTERBODY FUSION WITH INSTRUMENTATION AND ALLOGRAFT;  Surgeon: Phylliss Bob, MD;  Location: [place];  Campbell: Orthopedics;  Laterality: Right;  ? ? ?Family History  ?Problem Relation Age of Onset  ? Stroke Mother   ? Heart attack Mother   ? Coronary artery disease Father   ? Heart attack Brother   ? Heart attack Maternal  Grandfather   ? ?Social History:  reports that she has never smoked. She has never used smokeless tobacco. She reports current alcohol use. She reports that she does not use drugs. ? ?Allergies:  ?Allergies  ?Allergen Reactions  ? Nsaids Hives  ?  Specifically meloxicam and ibuprofen reported  ? Onion Swelling  ?  SWELLING REACTION UNSPECIFIED   ? Sulfa Antibiotics Swelling  ?  Patient reports tongue swells  ? Fluticasone Other (See Comments)  ?  ulcers  ? Oxycodone Nausea Only  ?  Severe nausea; pt has to take with Zofran  ? Tramadol Hives  ? Hydrocodone-Acetaminophen Other (See Comments)  ?  Hallucinations  ? Codeine Nausea And Vomiting  ? ? ?Medications Prior to Admission  ?Medication Sig Dispense Refill  ? acetaminophen (TYLENOL) 650 MG CR tablet 1,300 mg daily as needed for pain.    ? acyclovir (ZOVIRAX) 200 MG capsule Take 2 capsules by mouth 3 times a day for 5 days for recurrence. 360 capsule 0  ? Adalimumab (HUMIRA) 40 MG/0.8ML PSKT 0.8 mL Subcutaneous once weekly 30 days 4 each 5  ? alclomethasone (ACLOVATE) 0.05 % cream Apply topically to the affected areas up to 2 times a day as needed 60 g 3  ? celecoxib (CELEBREX) 200 MG capsule Take 1 capsule by mouth 2 times a day 180 capsule 1  ? Cetirizine  HCl (ZYRTEC ALLERGY) 10 MG CAPS as needed.    ? cholecalciferol (VITAMIN D) 25 MCG (1000 UT) tablet Take 1,000 Units by mouth daily.    ? CITRUS BERGAMOT PO Take 1 tablet by mouth every morning.     ? Cyanocobalamin (VITAMIN B-12) 5000 MCG TBDP Take 5,000 mcg by mouth daily.    ? cyclobenzaprine (FLEXERIL) 10 MG tablet TAKE 1 TABLET BY MOUTH ONCE DAILY AT BEDTIME 30 tablet 3  ? Dapsone 5 % topical gel Apply 1 application topically daily as needed.    ? estradiol (ESTRACE) 0.1 MG/GM vaginal cream Apply  1/2 to 1 gram at the vaginal introitus at bedtime twice a week as directed. 42.5 g 3  ? Estradiol (VAGIFEM) 10 MCG TABS vaginal tablet Place 1 tablet (10 mcg total) vaginally 2 (two) times a week. 24 tablet 3  ?  Evolocumab (REPATHA SURECLICK) 408 MG/ML SOAJ Inject 140 mg into the skin every 14 (fourteen) days. 2 mL 3  ? hydrOXYzine (VISTARIL) 25 MG capsule TAKE 1 CAPSULE BY MOUTH EVERY NIGHT AT BEDTIME 90 capsule 3  ? lisinopril-hydrochlorothiazide (ZESTORETIC) 10-12.5 MG tablet Take 1 tablet by daily 90 tablet 3  ? Omega-3 1000 MG CAPS Take 1,000 mg by mouth at bedtime.    ? omeprazole (PRILOSEC) 40 MG capsule Take 1 capsule by mouth 30 minutes before morning meal once daily 90 capsule 1  ? Polyethyl Glycol-Propyl Glycol 0.4-0.3 % SOLN Place 1 drop into both eyes 4 (four) times daily as needed (dry/irritated eyes).    ? venlafaxine XR (EFFEXOR-XR) 150 MG 24 hr capsule TAKE 1 CAPSULE BY MOUTH ONCE DAILY WITH FOOD 90 capsule 0  ? vitamin E 400 UNIT capsule Take 400 Units by mouth at bedtime.     ? aspirin EC 81 MG tablet Take 1 tablet (81 mg total) by mouth 2 (two) times daily. (Patient not taking: Reported on 10/10/2021) 60 tablet 0  ? lisinopril-hydrochlorothiazide (ZESTORETIC) 10-12.5 MG tablet Take 1 tablet by mouth once a day. 90 tablet 3  ? ? ?Results for orders placed or performed during the hospital encounter of 12/03/21 (from the past 48 hour(s))  ?CBC with Differential     Status: Abnormal  ? Collection Time: 12/03/21  2:05 PM  ?Result Value Ref Range  ? WBC 12.2 (H) 4.0 - 10.5 K/uL  ? RBC 3.38 (L) 3.87 - 5.11 MIL/uL  ? Hemoglobin 11.7 (L) 12.0 - 15.0 g/dL  ? HCT 34.9 (L) 36.0 - 46.0 %  ? MCV 103.3 (H) 80.0 - 100.0 fL  ? MCH 34.6 (H) 26.0 - 34.0 pg  ? MCHC 33.5 30.0 - 36.0 g/dL  ? RDW 11.6 11.5 - 15.5 %  ? Platelets 326 150 - 400 K/uL  ? nRBC 0.0 0.0 - 0.2 %  ? Neutrophils Relative % 73 %  ? Neutro Abs 8.8 (H) 1.7 - 7.7 K/uL  ? Lymphocytes Relative 20 %  ? Lymphs Abs 2.4 0.7 - 4.0 K/uL  ? Monocytes Relative 7 %  ? Monocytes Absolute 0.8 0.1 - 1.0 K/uL  ? Eosinophils Relative 0 %  ? Eosinophils Absolute 0.1 0.0 - 0.5 K/uL  ? Basophils Relative 0 %  ? Basophils Absolute 0.0 0.0 - 0.1 K/uL  ? Immature Granulocytes 0 %   ? Abs Immature Granulocytes 0.05 0.00 - 0.07 K/uL  ?  Comment: Performed at [facility], [place] [address], [place], [place] [address]  ?Basic metabolic panel     Status: Abnormal  ?  Collection Time: 12/03/21  2:05 PM  ?Result Value Ref Range  ? Sodium 136 135 - 145 mmol/L  ? Potassium 4.5 3.5 - 5.1 mmol/L  ? Chloride 105 98 - 111 mmol/L  ? CO2 25 22 - 32 mmol/L  ? Glucose, Bld 104 (H) 70 - 99 mg/dL  ?  Comment: Glucose reference range applies only to samples taken after fasting for at least 8 hours.  ? BUN 10 8 - 23 mg/dL  ? Creatinine, Ser 0.62 0.44 - 1.00 mg/dL  ? Calcium 8.6 (L) 8.9 - 10.3 mg/dL  ? GFR, Estimated >60 >60 mL/min  ?  Comment: (NOTE) ?Calculated using the CKD-EPI Creatinine Equation (2021) ?  ? Anion gap 6 5 - 15  ?  Comment: Performed at [facility], [place] [address], [place], [place] [address]  ? ?DG Hip Unilat W or Wo Pelvis 2-3 Views Left ? ?Result Date: 12/03/2021 ?CLINICAL DATA:  Hip dislocation. EXAM: DG HIP (WITH OR WITHOUT PELVIS) 2-3V LEFT COMPARISON:  12/14/2009. FINDINGS: Three view study. Status post bilateral hip replacement. Femoral component of the left hip arthroplasty is dislocated superiorly. No evidence for periprosthetic fracture. Bones are diffusely demineralized. IMPRESSION: Superior dislocation of the femoral component of the left hip arthroplasty. Electronically Signed   By: Misty Stanley M.D.   On: 12/03/2021 10:50   ? ?Review of Systems  ?All other systems reviewed and are negative. ? ?Blood pressure (!) 142/74, pulse 88, temperature (!) 97.4 ?F (36.3 ?C), temperature source Oral, resp. rate 17, height '5\' 4"'$  (1.626 m), weight 68 kg, SpO2 99 %. ?Physical Exam ?HENT:  ?   Head: Atraumatic.  ?Eyes:  ?   Extraocular Movements: Extraocular movements intact.  ?Cardiovascular:  ?   Pulses: Normal pulses.  ?Pulmonary:  ?   Effort: Pulmonary effort is normal.  ?Musculoskeletal:  ?   Comments: Left lower extremity shortened  ?Neurological:  ?   Mental Status: She is alert.   ?  ? ?Assessment/Plan ?Left prosthetic hip dislocation ?She will be taken to the operating room to reduce the hip under anesthesia with x-ray confirmation. ?If things are stable she will be discharged home

## 2021-12-03 NOTE — ED Notes (Signed)
Pt's husband knocked on EDP door multiple times while this RN was speaking w/ EDP about medication for pain and lab work. Pt's husband then walked into the nurses station and knocked on the glass window. ?

## 2021-12-03 NOTE — ED Notes (Signed)
RT at bedside.

## 2021-12-04 ENCOUNTER — Encounter (HOSPITAL_COMMUNITY): Payer: Self-pay | Admitting: Orthopedic Surgery

## 2021-12-04 ENCOUNTER — Other Ambulatory Visit (HOSPITAL_COMMUNITY): Payer: Self-pay

## 2021-12-04 NOTE — ED Provider Notes (Signed)
.  Sedation ? ?Date/Time: 12/03/2021 1:30 PM ?Performed by: Godfrey Pick, MD ?Authorized by: Godfrey Pick, MD  ? ?Consent:  ?  Consent obtained:  Verbal and written ?  Consent given by:  Patient ?  Risks discussed:  Prolonged hypoxia resulting in organ damage, vomiting, respiratory compromise necessitating ventilatory assistance and intubation, dysrhythmia, allergic reaction, inadequate sedation and nausea ?Universal protocol:  ?  Immediately prior to procedure, a time out was called: yes   ?  Patient identity confirmed:  Verbally with patient and arm band ?Indications:  ?  Procedure performed:  Dislocation reduction ?  Procedure necessitating sedation performed by:  Different physician ?Pre-sedation assessment:  ?  Time since last food or drink:  12 hours ?  ASA classification: class 2 - patient with mild systemic disease   ?  Mouth opening:  2 finger widths ?  Thyromental distance:  3 finger widths ?  Mallampati score:  II - soft palate, uvula, fauces visible ?  Neck mobility: normal   ?  Pre-sedation assessments completed and reviewed: airway patency, cardiovascular function, hydration status, mental status, nausea/vomiting, pain level and respiratory function   ?  Pre-sedation assessment completed:  12/03/2021 12:30 PM ?Immediate pre-procedure details:  ?  Reviewed: vital signs and NPO status   ?  Verified: bag valve mask available, emergency equipment available, intubation equipment available, IV patency confirmed and oxygen available   ?Procedure details (see MAR for exact dosages):  ?  Preoxygenation:  Nasal cannula ?  Sedation:  Propofol ?  Intended level of sedation: moderate (conscious sedation) ?  Analgesia:  Hydromorphone ?  Intra-procedure monitoring:  Blood pressure monitoring, cardiac monitor, continuous capnometry, continuous pulse oximetry, frequent LOC assessments and frequent vital sign checks ?  Intra-procedure events: none   ?  Total Provider sedation time (minutes):  25 ?Post-procedure details:  ?   Post-sedation assessment completed:  12/03/2021 1:30 PM ?  Attendance: Constant attendance by certified staff until patient recovered   ?  Recovery: Patient returned to pre-procedure baseline   ?  Post-sedation assessments completed and reviewed: airway patency, cardiovascular function, hydration status, mental status, nausea/vomiting, pain level and respiratory function   ?  Patient is stable for discharge or admission: yes   ?  Procedure completion:  Tolerated well, no immediate complications ? ?  ?Godfrey Pick, MD ?12/04/21 0023 ? ?

## 2021-12-05 ENCOUNTER — Other Ambulatory Visit (HOSPITAL_COMMUNITY): Payer: Self-pay

## 2021-12-07 DIAGNOSIS — M25552 Pain in left hip: Secondary | ICD-10-CM | POA: Diagnosis not present

## 2021-12-07 DIAGNOSIS — M24452 Recurrent dislocation, left hip: Secondary | ICD-10-CM | POA: Diagnosis not present

## 2021-12-07 DIAGNOSIS — Z9889 Other specified postprocedural states: Secondary | ICD-10-CM | POA: Diagnosis not present

## 2021-12-12 ENCOUNTER — Other Ambulatory Visit (HOSPITAL_COMMUNITY): Payer: Self-pay

## 2021-12-12 MED ORDER — HYDROXYZINE PAMOATE 25 MG PO CAPS
25.0000 mg | ORAL_CAPSULE | Freq: Every day | ORAL | 2 refills | Status: DC
  Filled 2021-12-12: qty 90, 90d supply, fill #0

## 2021-12-12 MED ORDER — CYCLOBENZAPRINE HCL 10 MG PO TABS
10.0000 mg | ORAL_TABLET | Freq: Every day | ORAL | 3 refills | Status: DC
  Filled 2021-12-12: qty 30, 30d supply, fill #0
  Filled 2022-01-11: qty 30, 30d supply, fill #1
  Filled 2022-02-05: qty 30, 30d supply, fill #2
  Filled 2022-03-01: qty 30, 30d supply, fill #3

## 2021-12-13 ENCOUNTER — Other Ambulatory Visit (HOSPITAL_COMMUNITY): Payer: Self-pay

## 2021-12-16 ENCOUNTER — Other Ambulatory Visit (HOSPITAL_COMMUNITY): Payer: Self-pay

## 2021-12-16 MED ORDER — HYDROXYZINE PAMOATE 25 MG PO CAPS
ORAL_CAPSULE | ORAL | 3 refills | Status: DC
  Filled 2021-12-16: qty 135, 67d supply, fill #0
  Filled 2022-03-17: qty 135, 67d supply, fill #1
  Filled 2022-08-09: qty 135, 67d supply, fill #2
  Filled 2022-10-10: qty 135, 67d supply, fill #3

## 2021-12-20 ENCOUNTER — Other Ambulatory Visit (HOSPITAL_COMMUNITY): Payer: Self-pay

## 2021-12-21 ENCOUNTER — Other Ambulatory Visit (HOSPITAL_COMMUNITY): Payer: Self-pay

## 2021-12-21 MED ORDER — VENLAFAXINE HCL ER 150 MG PO CP24
150.0000 mg | ORAL_CAPSULE | Freq: Every day | ORAL | 3 refills | Status: DC
  Filled 2021-12-21 (×2): qty 90, 90d supply, fill #0
  Filled 2022-03-27: qty 90, 90d supply, fill #1
  Filled 2022-05-03 – 2022-06-28 (×3): qty 90, 90d supply, fill #2
  Filled 2022-10-02 – 2022-10-05 (×5): qty 90, 90d supply, fill #3

## 2021-12-22 ENCOUNTER — Other Ambulatory Visit (HOSPITAL_COMMUNITY): Payer: Self-pay

## 2021-12-22 DIAGNOSIS — G4733 Obstructive sleep apnea (adult) (pediatric): Secondary | ICD-10-CM | POA: Diagnosis not present

## 2021-12-22 DIAGNOSIS — F321 Major depressive disorder, single episode, moderate: Secondary | ICD-10-CM | POA: Diagnosis not present

## 2021-12-22 DIAGNOSIS — I10 Essential (primary) hypertension: Secondary | ICD-10-CM | POA: Diagnosis not present

## 2021-12-27 ENCOUNTER — Other Ambulatory Visit (HOSPITAL_COMMUNITY): Payer: Self-pay

## 2021-12-28 ENCOUNTER — Other Ambulatory Visit (HOSPITAL_COMMUNITY): Payer: Self-pay

## 2021-12-29 ENCOUNTER — Other Ambulatory Visit (HOSPITAL_COMMUNITY): Payer: Self-pay

## 2022-01-03 ENCOUNTER — Telehealth: Payer: Self-pay | Admitting: Pharmacist

## 2022-01-03 ENCOUNTER — Other Ambulatory Visit (HOSPITAL_COMMUNITY): Payer: Self-pay

## 2022-01-03 DIAGNOSIS — G4733 Obstructive sleep apnea (adult) (pediatric): Secondary | ICD-10-CM | POA: Diagnosis not present

## 2022-01-03 NOTE — Telephone Encounter (Signed)
Called patient to schedule an appointment for the [facility]. I was unable to reach the patient so I left a HIPAA-compliant message requesting that the patient return my call.   Luke Van Ausdall, PharmD, BCACP, CPP Clinical Pharmacist [facility] [phone]  

## 2022-01-11 ENCOUNTER — Other Ambulatory Visit (HOSPITAL_COMMUNITY): Payer: Self-pay

## 2022-01-16 ENCOUNTER — Other Ambulatory Visit (HOSPITAL_COMMUNITY): Payer: Self-pay

## 2022-01-22 ENCOUNTER — Other Ambulatory Visit (HOSPITAL_COMMUNITY): Payer: Self-pay

## 2022-01-25 ENCOUNTER — Other Ambulatory Visit (HOSPITAL_BASED_OUTPATIENT_CLINIC_OR_DEPARTMENT_OTHER): Payer: Self-pay | Admitting: Internal Medicine

## 2022-01-25 ENCOUNTER — Other Ambulatory Visit (HOSPITAL_COMMUNITY): Payer: Self-pay

## 2022-01-25 DIAGNOSIS — E7849 Other hyperlipidemia: Secondary | ICD-10-CM

## 2022-01-25 DIAGNOSIS — E7800 Pure hypercholesterolemia, unspecified: Secondary | ICD-10-CM

## 2022-01-25 MED ORDER — REPATHA SURECLICK 140 MG/ML ~~LOC~~ SOAJ
140.0000 mg | SUBCUTANEOUS | 3 refills | Status: DC
  Filled 2022-01-25: qty 2, 28d supply, fill #0
  Filled 2022-02-12: qty 2, 28d supply, fill #1
  Filled 2022-03-17: qty 2, 28d supply, fill #2
  Filled 2022-04-14: qty 2, 28d supply, fill #3

## 2022-02-03 DIAGNOSIS — G4733 Obstructive sleep apnea (adult) (pediatric): Secondary | ICD-10-CM | POA: Diagnosis not present

## 2022-02-05 ENCOUNTER — Other Ambulatory Visit (HOSPITAL_COMMUNITY): Payer: Self-pay

## 2022-02-06 ENCOUNTER — Other Ambulatory Visit (HOSPITAL_COMMUNITY): Payer: Self-pay

## 2022-02-08 ENCOUNTER — Other Ambulatory Visit (HOSPITAL_COMMUNITY): Payer: Self-pay

## 2022-02-12 ENCOUNTER — Other Ambulatory Visit (HOSPITAL_COMMUNITY): Payer: Self-pay

## 2022-02-14 ENCOUNTER — Other Ambulatory Visit (HOSPITAL_COMMUNITY): Payer: Self-pay

## 2022-02-15 ENCOUNTER — Other Ambulatory Visit (HOSPITAL_COMMUNITY): Payer: Self-pay

## 2022-02-16 ENCOUNTER — Ambulatory Visit: Payer: 59 | Admitting: Internal Medicine

## 2022-02-16 ENCOUNTER — Other Ambulatory Visit (HOSPITAL_COMMUNITY): Payer: Self-pay

## 2022-02-16 DIAGNOSIS — R3915 Urgency of urination: Secondary | ICD-10-CM | POA: Diagnosis not present

## 2022-02-16 DIAGNOSIS — N3020 Other chronic cystitis without hematuria: Secondary | ICD-10-CM | POA: Diagnosis not present

## 2022-02-16 DIAGNOSIS — R300 Dysuria: Secondary | ICD-10-CM | POA: Diagnosis not present

## 2022-02-16 DIAGNOSIS — N952 Postmenopausal atrophic vaginitis: Secondary | ICD-10-CM | POA: Diagnosis not present

## 2022-02-16 MED ORDER — ESTRADIOL 10 MCG VA TABS
ORAL_TABLET | VAGINAL | 3 refills | Status: DC
  Filled 2022-02-16 – 2022-05-14 (×2): qty 24, 84d supply, fill #0
  Filled 2022-07-30 – 2022-08-09 (×2): qty 24, 84d supply, fill #1
  Filled 2022-11-14: qty 24, 84d supply, fill #2

## 2022-02-16 MED ORDER — ESTRADIOL 0.1 MG/GM VA CREA
TOPICAL_CREAM | VAGINAL | 3 refills | Status: DC
  Filled 2022-02-16: qty 42.5, 90d supply, fill #0

## 2022-02-17 ENCOUNTER — Other Ambulatory Visit (HOSPITAL_COMMUNITY): Payer: Self-pay

## 2022-02-19 ENCOUNTER — Other Ambulatory Visit (HOSPITAL_COMMUNITY): Payer: Self-pay

## 2022-02-28 ENCOUNTER — Other Ambulatory Visit (HOSPITAL_COMMUNITY): Payer: Self-pay

## 2022-03-01 ENCOUNTER — Other Ambulatory Visit (HOSPITAL_COMMUNITY): Payer: Self-pay

## 2022-03-02 ENCOUNTER — Ambulatory Visit (HOSPITAL_BASED_OUTPATIENT_CLINIC_OR_DEPARTMENT_OTHER): Payer: PRIVATE HEALTH INSURANCE | Admitting: Internal Medicine

## 2022-03-02 ENCOUNTER — Other Ambulatory Visit (HOSPITAL_COMMUNITY): Payer: Self-pay

## 2022-03-02 MED ORDER — OMEPRAZOLE 40 MG PO CPDR
DELAYED_RELEASE_CAPSULE | ORAL | 1 refills | Status: DC
  Filled 2022-03-02: qty 90, 90d supply, fill #0
  Filled 2022-05-31: qty 90, 90d supply, fill #1

## 2022-03-05 DIAGNOSIS — G4733 Obstructive sleep apnea (adult) (pediatric): Secondary | ICD-10-CM | POA: Diagnosis not present

## 2022-03-05 DIAGNOSIS — E7849 Other hyperlipidemia: Secondary | ICD-10-CM | POA: Diagnosis not present

## 2022-03-06 LAB — NMR, LIPOPROFILE
Cholesterol, Total: 195 mg/dL (ref 100–199)
HDL Particle Number: 39.6 umol/L (ref >=30.5)
HDL-C: 50 mg/dL (ref >39)
LDL Particle Number: 1223 nmol/L — ABNORMAL HIGH (ref <1000)
LDL Size: 19.9 nm — ABNORMAL LOW (ref >20.5)
LDL-C (NIH Calc): 95 mg/dL (ref 0–99)
LP-IR Score: 76 — ABNORMAL HIGH (ref <=45)
Small LDL Particle Number: 898 nmol/L — ABNORMAL HIGH (ref <=527)
Triglycerides: 303 mg/dL — ABNORMAL HIGH (ref 0–149)

## 2022-03-06 LAB — LIPOPROTEIN A (LPA): Lipoprotein (a): 139.4 nmol/L — ABNORMAL HIGH (ref <75.0)

## 2022-03-07 ENCOUNTER — Other Ambulatory Visit (HOSPITAL_COMMUNITY): Payer: Self-pay

## 2022-03-07 ENCOUNTER — Ambulatory Visit: Payer: 59 | Admitting: Internal Medicine

## 2022-03-07 ENCOUNTER — Encounter: Payer: Self-pay | Admitting: Internal Medicine

## 2022-03-07 VITALS — BP 126/64 | HR 92 | Ht 64.0 in | Wt 149.2 lb

## 2022-03-07 DIAGNOSIS — E7849 Other hyperlipidemia: Secondary | ICD-10-CM | POA: Diagnosis not present

## 2022-03-07 DIAGNOSIS — T466X5D Adverse effect of antihyperlipidemic and antiarteriosclerotic drugs, subsequent encounter: Secondary | ICD-10-CM | POA: Diagnosis not present

## 2022-03-07 DIAGNOSIS — R931 Abnormal findings on diagnostic imaging of heart and coronary circulation: Secondary | ICD-10-CM | POA: Diagnosis not present

## 2022-03-07 DIAGNOSIS — M791 Myalgia, unspecified site: Secondary | ICD-10-CM

## 2022-03-07 DIAGNOSIS — T466X5A Adverse effect of antihyperlipidemic and antiarteriosclerotic drugs, initial encounter: Secondary | ICD-10-CM

## 2022-03-07 MED ORDER — EZETIMIBE 10 MG PO TABS
10.0000 mg | ORAL_TABLET | Freq: Every day | ORAL | 3 refills | Status: DC
  Filled 2022-03-07: qty 90, 90d supply, fill #0
  Filled 2022-05-24: qty 90, 90d supply, fill #1
  Filled 2022-08-11: qty 90, 90d supply, fill #2
  Filled 2022-11-25: qty 90, 90d supply, fill #3

## 2022-03-07 NOTE — Patient Instructions (Signed)
Medication Instructions:  START zetia '10mg'$  daily CONTINUE all other current medications  *If you need a refill on your cardiac medications before your next appointment, please call your pharmacy*   Lab Work: FASTING lab work to assess cholesterol in 3-4 months  If you have labs (blood work) drawn today and your tests are completely normal, you will receive your results only by: [place] (if you have MyChart) OR A paper copy in the mail If you have any lab test that is abnormal or we need to change your treatment, we will call you to review the results.   Testing/Procedures: NONE   Follow-Up: At [facility], you and your health needs are our priority.  As part of our continuing mission to provide you with exceptional heart care, we have created designated Provider Care Teams.  These Care Teams include your primary Cardiologist (physician) and Advanced Practice Providers (APPs -  Physician Assistants and Nurse Practitioners) who all work together to provide you with the care you need, when you need it.  We recommend signing up for the patient portal called "MyChart".  Sign up information is provided on this After Visit Summary.  MyChart is used to connect with patients for Virtual Visits (Telemedicine).  Patients are able to view lab/test results, encounter notes, upcoming appointments, etc.  Non-urgent messages can be sent to your provider as well.   To learn more about what you can do with MyChart, go to [email or web address].    Your next appointment:   3-4 months with Dr. Debara Pickett

## 2022-03-07 NOTE — Progress Notes (Signed)
LIPID CLINIC CONSULT NOTE  Chief Complaint:  Follow-up dyslipidemia  Primary Care Physician: [name], MD  Primary Cardiologist:  None  HPI:  Adrienne Campbell is a 61 y.o. female who is being seen today for the evaluation of dyslipidemia at the request of Koirala, Dibas, MD. 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  She has past medical history significant for dyslipidemia and has been intolerant to statins.  Most recent lipids show significantly elevated cholesterol with total 361, triglycerides 404, HDL 54 and LDL 221.  She also reports family history of coronary disease including her mom who had an MI and a stroke in her father who had two-vessel bypass.  Her paternal grandfather also died of an MI at an earlier age and her brother had an MI believe in his 21s.  Based on this there is a strong suspicion for familial hyperlipidemia.  According to her primary care provider she had previously been on rosuvastatin at various doses and atorvastatin including high intensity doses and was not able to tolerate the medications due to myalgias/severe leg cramps.  She reports a healthy diet that is pescatarian and tried to avoid fats.  03/07/2022  Ms. Adrienne Campbell returns today for follow-up.  She has had a marked improvement in her LDL particle numbers.  LDL-P is now 1223, LDL-C of 95 (down from 221), HDL is 50 and triglycerides are 303.  Her LP(a) was elevated at 139.  Her calcium score did come back abnormal with a calcium score 78.2, 86th percentile for age and sex matched controls.  I discussed the meaning of this with her today in the office.  Overall I am pleased with her lipid lowering however I think we have reached the maximal benefit of that medication.  She still remains above ideal targets.  PMHx:  Past Medical History:  Diagnosis Date   Anemia    Anxiety    Arthritis    Depression    Environmental and  seasonal allergies    Heart murmur    during pregnancy   Hyperlipidemia    Hypertension    Insomnia    Pneumonia    hx of   PONV (postoperative nausea and vomiting)    Nausea, per pt. "felt loopy after hip surgery"   and made hallucinate    Past Surgical History:  Procedure Laterality Date   ABDOMINAL HYSTERECTOMY     COLONOSCOPY     HIP CLOSED REDUCTION Left 12/03/2021   Procedure: CLOSED REDUCTION HIP;  Surgeon: Tania Ade, MD;  Location: [facility];  Service: Orthopedics;  Laterality: Left;   TOTAL HIP ARTHROPLASTY Left 06/28/2014   Procedure: LEFT TOTAL HIP ARTHROPLASTY;  Surgeon: Kerin Salen, MD;  Location: [place];  Service: Orthopedics;  Laterality: Left;   TOTAL HIP ARTHROPLASTY Right 04/27/2019   Procedure: Right Anterior Hip Arthroplasty;  Surgeon: Frederik Pear, MD;  Location: [facility];  Service: Orthopedics;  Laterality: Right;   TOTAL KNEE ARTHROPLASTY Right 08/11/2014   Procedure: RIGHT TOTAL KNEE ARTHROPLASTY;  Surgeon: Kerin Salen, MD;  Location: [place];  Service: Orthopedics;  Laterality: Right;   TRANSFORAMINAL LUMBAR INTERBODY FUSION (TLIF) WITH PEDICLE SCREW FIXATION 1 LEVEL Right 11/12/2018   Procedure: RIGHT-SIDED LUMBAR 4-5 TRANSFORAMINAL LUMBAR INTERBODY FUSION WITH INSTRUMENTATION AND ALLOGRAFT;  Surgeon: Phylliss Bob, MD;  Location: [place];  Service: Orthopedics;  Laterality: Right;    FAMHx:  Family History  Problem Relation Age of  Onset   Stroke Mother    Heart attack Mother    Coronary artery disease Father    Heart attack Brother    Heart attack Maternal Grandfather     SOCHx:   reports that she has never smoked. She has never used smokeless tobacco. She reports current alcohol use. She reports that she does not use drugs.  ALLERGIES:  Allergies  Allergen Reactions   Nsaids Hives    Specifically meloxicam and ibuprofen reported   Onion Swelling    SWELLING REACTION UNSPECIFIED    Sulfa Antibiotics Swelling    Patient reports tongue swells    Fluticasone Other (See Comments)    ulcers   Oxycodone Nausea Only    Severe nausea; pt has to take with Zofran   Tramadol Hives   Hydrocodone-Acetaminophen Other (See Comments)    Hallucinations   Ibuprofen     Other reaction(s): hives/itching   Meloxicam     Other reaction(s): pruritus   Rosuvastatin Calcium     Other reaction(s): cramps   Tizanidine Hcl     Other reaction(s): Hallucinations   Codeine Nausea And Vomiting    ROS: Pertinent items noted in HPI and remainder of comprehensive ROS otherwise negative.  HOME MEDS: Current Outpatient Medications on File Prior to Visit  Medication Sig Dispense Refill   acetaminophen (TYLENOL) 650 MG CR tablet 1,300 mg daily as needed for pain.     acyclovir (ZOVIRAX) 200 MG capsule Take 2 capsules by mouth 3 times a day for 5 days for recurrence. 360 capsule 0   Adalimumab (HUMIRA) 40 MG/0.8ML PSKT Inject 0.8 mL Subcutaneous once a week. 4 each 5   alclomethasone (ACLOVATE) 0.05 % cream Apply topically to the affected areas up to 2 times a day as needed 60 g 3   celecoxib (CELEBREX) 200 MG capsule Take 1 capsule by mouth 2 times a day 180 capsule 1   Cetirizine HCl (ZYRTEC ALLERGY) 10 MG CAPS as needed.     cholecalciferol (VITAMIN D) 25 MCG (1000 UT) tablet Take 1,000 Units by mouth daily.     CITRUS BERGAMOT PO Take 1 tablet by mouth every morning.      Cyanocobalamin (VITAMIN B-12) 5000 MCG TBDP Take 5,000 mcg by mouth daily.     cyclobenzaprine (FLEXERIL) 10 MG tablet TAKE 1 TABLET BY MOUTH ONCE DAILY AT BEDTIME 30 tablet 3   Dapsone 5 % topical gel Apply 1 application topically daily as needed.     estradiol (ESTRACE) 0.1 MG/GM vaginal cream Apply  1/2 to 1 gram at the vaginal introitus at bedtime twice a week as directed. 42.5 g 3   Estradiol (VAGIFEM) 10 MCG TABS vaginal tablet Insert 1 tablet vaginally 2 times a week 24 tablet 3   Evolocumab (REPATHA SURECLICK) 573 MG/ML SOAJ Inject 140 mg into the skin every 14 (fourteen) days.  2 mL 3   hydrOXYzine (VISTARIL) 25 MG capsule Take 1 - 2 capsules by mouth at bedtime 135 capsule 3   lisinopril-hydrochlorothiazide (ZESTORETIC) 10-12.5 MG tablet Take 1 tablet by daily 90 tablet 3   Omega-3 1000 MG CAPS Take 1,000 mg by mouth at bedtime.     omeprazole (PRILOSEC) 40 MG capsule Take 1 capsule by mouth 30 minutes before morning meal once daily 90 capsule 1   Polyethyl Glycol-Propyl Glycol 0.4-0.3 % SOLN Place 1 drop into both eyes 4 (four) times daily as needed (dry/irritated eyes).     venlafaxine XR (EFFEXOR-XR) 150 MG 24 hr capsule TAKE  1 CAPSULE BY MOUTH ONCE DAILY WITH FOOD 90 capsule 3   vitamin E 400 UNIT capsule Take 400 Units by mouth at bedtime.      No current facility-administered medications on file prior to visit.    LABS/IMAGING: No results found for this or any previous visit (from the past 48 hour(s)). No results found.  LIPID PANEL: No results found for: "CHOL", "TRIG", "HDL", "CHOLHDL", "VLDL", "LDLCALC", "LDLDIRECT"  WEIGHTS: Wt Readings from Last 3 Encounters:  03/07/22 149 lb 3.2 oz (67.7 kg)  12/03/21 150 lb (68 kg)  10/18/21 156 lb (70.8 kg)    VITALS: BP 126/64   Pulse 92   Ht '5\' 4"'$  (1.626 m)   Wt 149 lb 3.2 oz (67.7 kg)   SpO2 98%   BMI 25.61 kg/m   EXAM: Deferred  EKG: N/A  ASSESSMENT: Probable familial hyperlipidemia by Simon-Broome criteria, LDL greater than 190 Family history of multiple family members with premature coronary disease and high cholesterol Statin intolerance-myalgias/cramps CAC score of 78.2, 38 percentile for age and sex matched controls disease (11/2021)  PLAN: 1.   Ms. Adrienne Campbell had a much greater than expected coronary artery calcium score based on her age.  This likely suggests pathogenic dyslipidemia.  She has had a good response to Repatha but her LDL and particle numbers remain above target.  I would advise additional therapy and recommend adding ezetimibe to the Repatha and would repeat a lipid  NMR in about 3 months.  Follow-up with me afterwards.  Pixie Casino, MD, [facility] [place] Director of the Advanced Lipid Disorders &  Cardiovascular Risk Reduction Clinic Diplomate of the American Board of Clinical Lipidology Attending Cardiologist  Direct Dial: [phone]  Fax: [phone]  Website:  [email or web address] 03/07/2022, 2:55 PM

## 2022-03-09 ENCOUNTER — Other Ambulatory Visit (HOSPITAL_COMMUNITY): Payer: Self-pay

## 2022-03-12 ENCOUNTER — Other Ambulatory Visit (HOSPITAL_COMMUNITY): Payer: Self-pay

## 2022-03-14 ENCOUNTER — Other Ambulatory Visit (HOSPITAL_COMMUNITY): Payer: Self-pay

## 2022-03-17 ENCOUNTER — Other Ambulatory Visit (HOSPITAL_COMMUNITY): Payer: Self-pay

## 2022-03-27 ENCOUNTER — Other Ambulatory Visit (HOSPITAL_COMMUNITY): Payer: Self-pay

## 2022-03-27 DIAGNOSIS — F321 Major depressive disorder, single episode, moderate: Secondary | ICD-10-CM | POA: Diagnosis not present

## 2022-03-27 DIAGNOSIS — G4733 Obstructive sleep apnea (adult) (pediatric): Secondary | ICD-10-CM | POA: Diagnosis not present

## 2022-03-27 DIAGNOSIS — I10 Essential (primary) hypertension: Secondary | ICD-10-CM | POA: Diagnosis not present

## 2022-03-27 MED ORDER — CYCLOBENZAPRINE HCL 10 MG PO TABS
10.0000 mg | ORAL_TABLET | Freq: Every day | ORAL | 3 refills | Status: DC
  Filled 2022-03-27: qty 30, 30d supply, fill #0
  Filled 2022-04-30: qty 30, 30d supply, fill #1
  Filled 2022-05-24: qty 30, 30d supply, fill #2
  Filled 2022-06-29: qty 30, 30d supply, fill #3

## 2022-03-28 ENCOUNTER — Other Ambulatory Visit (HOSPITAL_COMMUNITY): Payer: Self-pay

## 2022-04-02 DIAGNOSIS — Z1589 Genetic susceptibility to other disease: Secondary | ICD-10-CM | POA: Diagnosis not present

## 2022-04-02 DIAGNOSIS — M1991 Primary osteoarthritis, unspecified site: Secondary | ICD-10-CM | POA: Diagnosis not present

## 2022-04-02 DIAGNOSIS — Z6824 Body mass index (BMI) 24.0-24.9, adult: Secondary | ICD-10-CM | POA: Diagnosis not present

## 2022-04-02 DIAGNOSIS — M459 Ankylosing spondylitis of unspecified sites in spine: Secondary | ICD-10-CM | POA: Diagnosis not present

## 2022-04-02 DIAGNOSIS — Z79899 Other long term (current) drug therapy: Secondary | ICD-10-CM | POA: Diagnosis not present

## 2022-04-05 ENCOUNTER — Other Ambulatory Visit (HOSPITAL_COMMUNITY): Payer: Self-pay

## 2022-04-05 ENCOUNTER — Ambulatory Visit: Payer: 59 | Attending: Family Medicine | Admitting: Pharmacist

## 2022-04-05 DIAGNOSIS — G4733 Obstructive sleep apnea (adult) (pediatric): Secondary | ICD-10-CM | POA: Diagnosis not present

## 2022-04-05 DIAGNOSIS — Z7189 Other specified counseling: Secondary | ICD-10-CM

## 2022-04-05 MED ORDER — HUMIRA 40 MG/0.8ML ~~LOC~~ PSKT
PREFILLED_SYRINGE | SUBCUTANEOUS | 5 refills | Status: DC
  Filled 2022-04-05: qty 4, 28d supply, fill #0
  Filled 2022-05-02: qty 4, 28d supply, fill #1
  Filled 2022-06-01: qty 4, 28d supply, fill #2
  Filled 2022-06-28 (×2): qty 4, 28d supply, fill #3
  Filled 2022-07-25: qty 4, 28d supply, fill #4
  Filled 2022-08-20 (×2): qty 4, 28d supply, fill #5

## 2022-04-05 MED ORDER — HUMIRA 40 MG/0.8ML ~~LOC~~ PSKT: PREFILLED_SYRINGE | SUBCUTANEOUS | 5 refills | Status: DC

## 2022-04-05 NOTE — Progress Notes (Signed)
  S: Patient presents for review of their specialty medication therapy.  Patient is currently taking Humira for ankylosing spondylitis. Patient is managed by Marella Chimes for this.   Adherence: denies any missed doses  Efficacy: working well for her  Dosing:  Ankylosing spondylitis: SubQ: 40 mg every week  Dose adjustments: Renal: no dose adjustments (has not been studied) Hepatic: no dose adjustments (has not been studied)  Screening: TB test: completed per patient; negative Hepatitis: completed per patient  Monitoring: S/sx of infection: denies CBC: followed by rheum, has been WNL S/sx of hypersensitivity: denies S/sx of malignancy: denies S/sx of heart failure: denies Recently underwent testing for Ab, and this was negative  O:      Lab Results  Component Value Date   WBC 12.2 (H) 12/03/2021   HGB 11.7 (L) 12/03/2021   HCT 34.9 (L) 12/03/2021   MCV 103.3 (H) 12/03/2021   PLT 326 12/03/2021      Chemistry      Component Value Date/Time   NA 136 12/03/2021 1405   K 4.5 12/03/2021 1405   CL 105 12/03/2021 1405   CO2 25 12/03/2021 1405   BUN 10 12/03/2021 1405   CREATININE 0.62 12/03/2021 1405      Component Value Date/Time   CALCIUM 8.6 (L) 12/03/2021 1405   ALKPHOS 70 11/07/2018 1329   AST 21 11/07/2018 1329   ALT 15 11/07/2018 1329   BILITOT 0.5 11/07/2018 1329     A/P: 1. Medication review: Patient currently on Humira for ankylosing spondylitis. Reviewed the medication with the patient, including the following: Humira is a TNF blocking agent indicated for ankylosing spondylitis, Crohn's disease, Hidradenitis suppurativa, psoriatic arthritis, plaque psoriasis, ulcerative colitis, and uveitis. Patient educated on purpose, proper use and potential adverse effects of Humira. The most common adverse effects are infections, headache, and injection site reactions. There is the possibility of an increased risk of malignancy but it is not well understood if this  increased risk is due to there medication or the disease state. There are rare cases of pancytopenia and aplastic anemia. No recommendations for any changes at this time.  Benard Halsted, PharmD, [place], [place] [phone]

## 2022-04-06 ENCOUNTER — Other Ambulatory Visit (HOSPITAL_COMMUNITY): Payer: Self-pay

## 2022-04-11 ENCOUNTER — Other Ambulatory Visit (HOSPITAL_COMMUNITY): Payer: Self-pay

## 2022-04-13 ENCOUNTER — Other Ambulatory Visit (HOSPITAL_COMMUNITY): Payer: Self-pay

## 2022-04-13 MED ORDER — PEG 3350-KCL-NA BICARB-NACL 420 G PO SOLR
ORAL | 0 refills | Status: DC
  Filled 2022-04-13: qty 4000, 1d supply, fill #0

## 2022-04-13 MED ORDER — CELECOXIB 200 MG PO CAPS
200.0000 mg | ORAL_CAPSULE | Freq: Two times a day (BID) | ORAL | 1 refills | Status: DC
  Filled 2022-04-13: qty 180, 90d supply, fill #0
  Filled 2022-07-30: qty 180, 90d supply, fill #1

## 2022-04-14 ENCOUNTER — Other Ambulatory Visit (HOSPITAL_COMMUNITY): Payer: Self-pay

## 2022-04-19 DIAGNOSIS — D122 Benign neoplasm of ascending colon: Secondary | ICD-10-CM | POA: Diagnosis not present

## 2022-04-19 DIAGNOSIS — Z1211 Encounter for screening for malignant neoplasm of colon: Secondary | ICD-10-CM | POA: Diagnosis not present

## 2022-04-19 DIAGNOSIS — K6389 Other specified diseases of intestine: Secondary | ICD-10-CM | POA: Diagnosis not present

## 2022-04-30 ENCOUNTER — Other Ambulatory Visit (HOSPITAL_COMMUNITY): Payer: Self-pay

## 2022-05-02 ENCOUNTER — Other Ambulatory Visit (HOSPITAL_COMMUNITY): Payer: Self-pay

## 2022-05-03 ENCOUNTER — Other Ambulatory Visit (HOSPITAL_COMMUNITY): Payer: Self-pay

## 2022-05-08 ENCOUNTER — Other Ambulatory Visit (HOSPITAL_COMMUNITY): Payer: Self-pay

## 2022-05-14 ENCOUNTER — Other Ambulatory Visit (HOSPITAL_BASED_OUTPATIENT_CLINIC_OR_DEPARTMENT_OTHER): Payer: Self-pay | Admitting: Internal Medicine

## 2022-05-14 ENCOUNTER — Other Ambulatory Visit (HOSPITAL_COMMUNITY): Payer: Self-pay

## 2022-05-14 DIAGNOSIS — E7800 Pure hypercholesterolemia, unspecified: Secondary | ICD-10-CM

## 2022-05-14 DIAGNOSIS — E7849 Other hyperlipidemia: Secondary | ICD-10-CM

## 2022-05-15 ENCOUNTER — Other Ambulatory Visit (HOSPITAL_COMMUNITY): Payer: Self-pay

## 2022-05-18 ENCOUNTER — Other Ambulatory Visit (HOSPITAL_COMMUNITY): Payer: Self-pay

## 2022-05-18 ENCOUNTER — Encounter: Payer: Self-pay | Admitting: Internal Medicine

## 2022-05-18 DIAGNOSIS — E7800 Pure hypercholesterolemia, unspecified: Secondary | ICD-10-CM

## 2022-05-18 DIAGNOSIS — E7849 Other hyperlipidemia: Secondary | ICD-10-CM

## 2022-05-19 ENCOUNTER — Other Ambulatory Visit (HOSPITAL_BASED_OUTPATIENT_CLINIC_OR_DEPARTMENT_OTHER): Payer: Self-pay | Admitting: Internal Medicine

## 2022-05-19 ENCOUNTER — Other Ambulatory Visit (HOSPITAL_COMMUNITY): Payer: Self-pay

## 2022-05-19 DIAGNOSIS — E7800 Pure hypercholesterolemia, unspecified: Secondary | ICD-10-CM

## 2022-05-19 DIAGNOSIS — E7849 Other hyperlipidemia: Secondary | ICD-10-CM

## 2022-05-22 ENCOUNTER — Other Ambulatory Visit (HOSPITAL_COMMUNITY): Payer: Self-pay

## 2022-05-22 MED ORDER — REPATHA SURECLICK 140 MG/ML ~~LOC~~ SOAJ
140.0000 mg | SUBCUTANEOUS | 3 refills | Status: DC
  Filled 2022-05-22: qty 6, 84d supply, fill #0
  Filled 2022-08-09: qty 6, 84d supply, fill #1
  Filled 2022-10-24 – 2022-11-14 (×2): qty 6, 84d supply, fill #2
  Filled 2023-02-08 – 2023-02-11 (×2): qty 6, 84d supply, fill #3

## 2022-05-24 ENCOUNTER — Other Ambulatory Visit (HOSPITAL_COMMUNITY): Payer: Self-pay

## 2022-05-31 ENCOUNTER — Other Ambulatory Visit (HOSPITAL_COMMUNITY): Payer: Self-pay

## 2022-06-01 ENCOUNTER — Other Ambulatory Visit (HOSPITAL_COMMUNITY): Payer: Self-pay

## 2022-06-03 IMAGING — DX DG HIP (WITH OR WITHOUT PELVIS) 2-3V*L*
3 series · 3 of 3 positions shown · non-contrast
Comparison: 12/14/2009.

CLINICAL DATA: Hip dislocation.

EXAM:
DG HIP (WITH OR WITHOUT PELVIS) 2-3V LEFT

[pelvis ap]
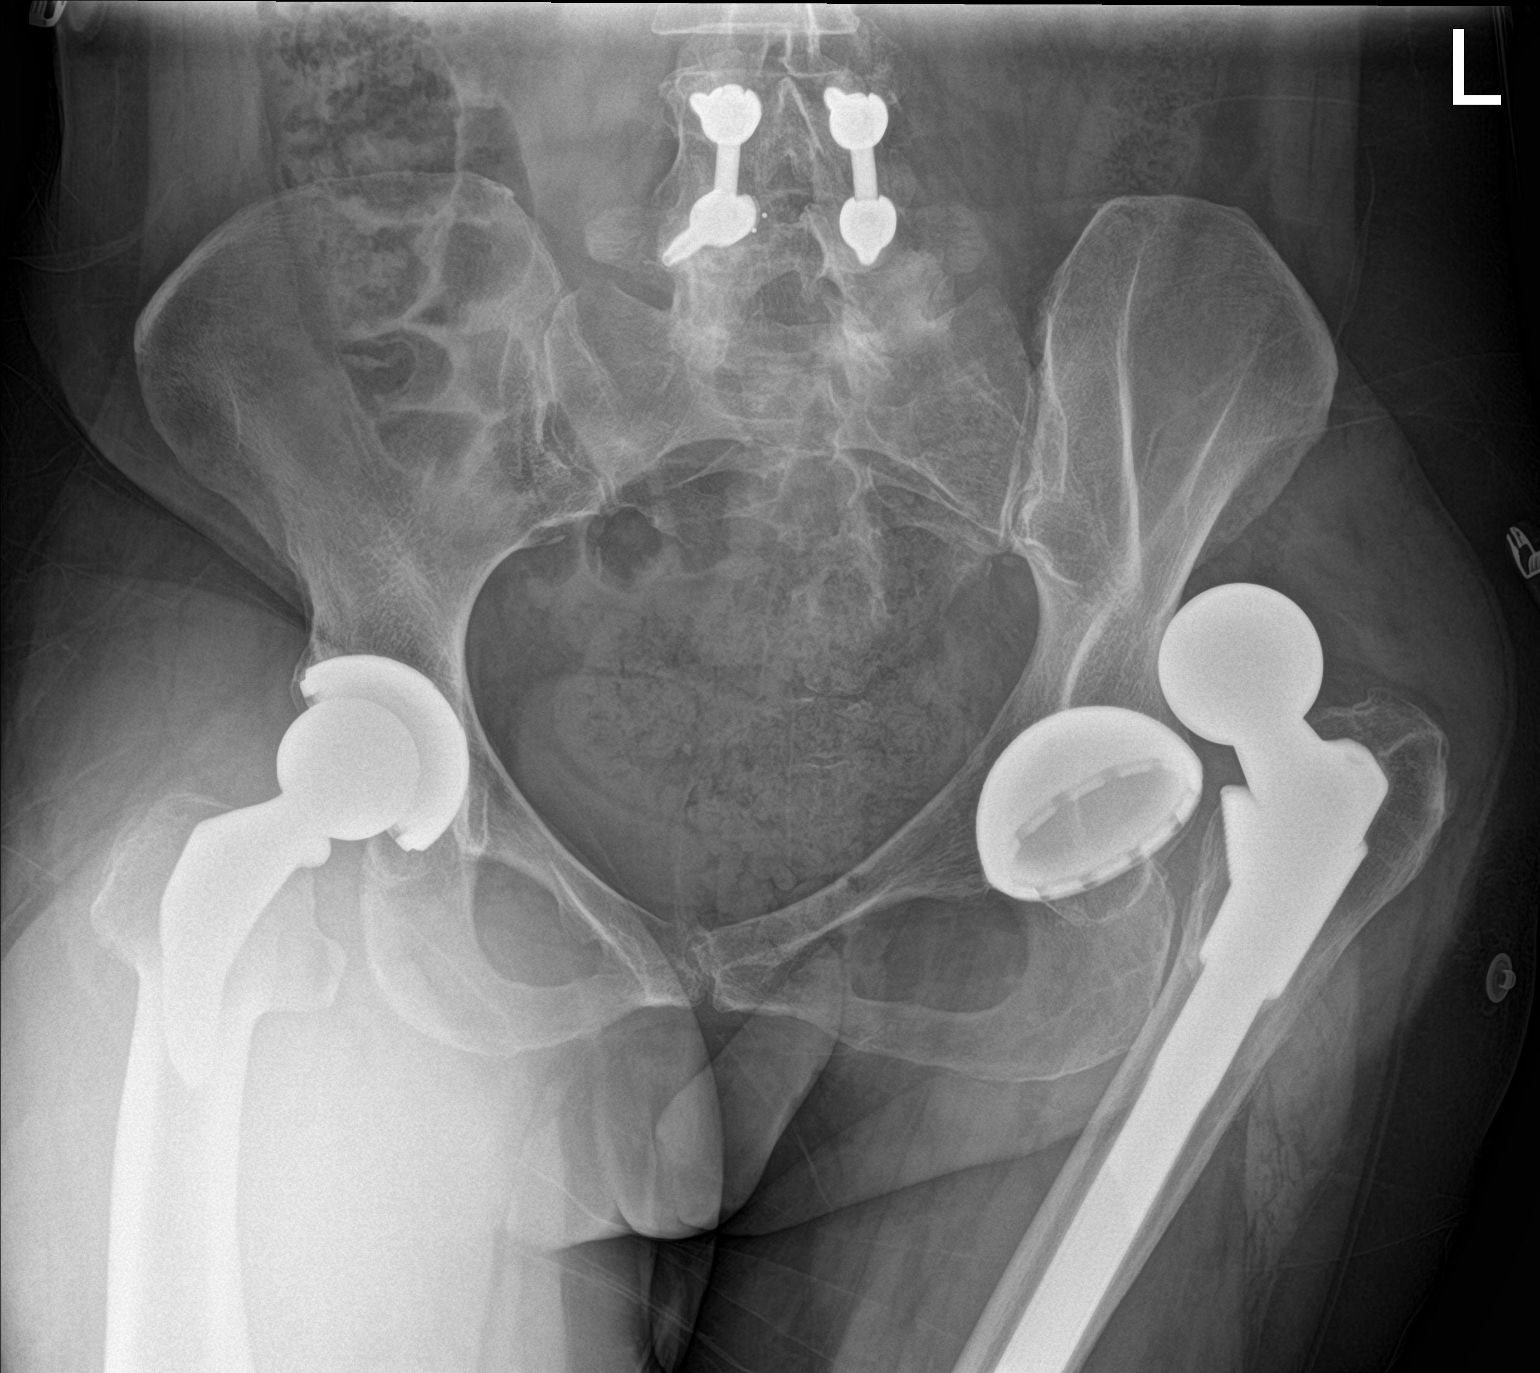

[hip ap]
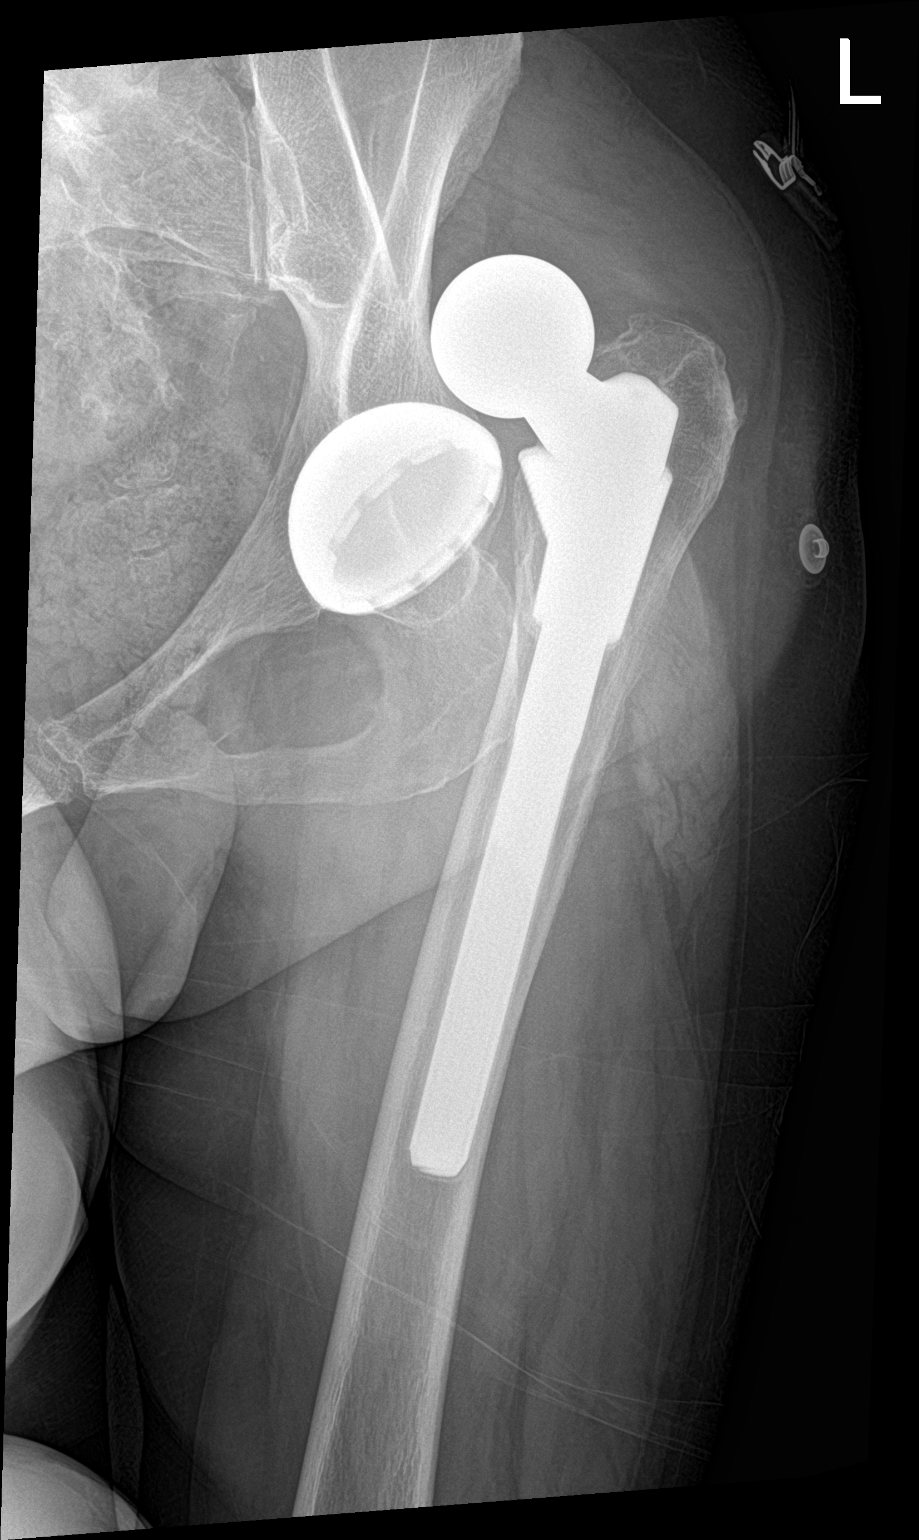

[hip x-table]
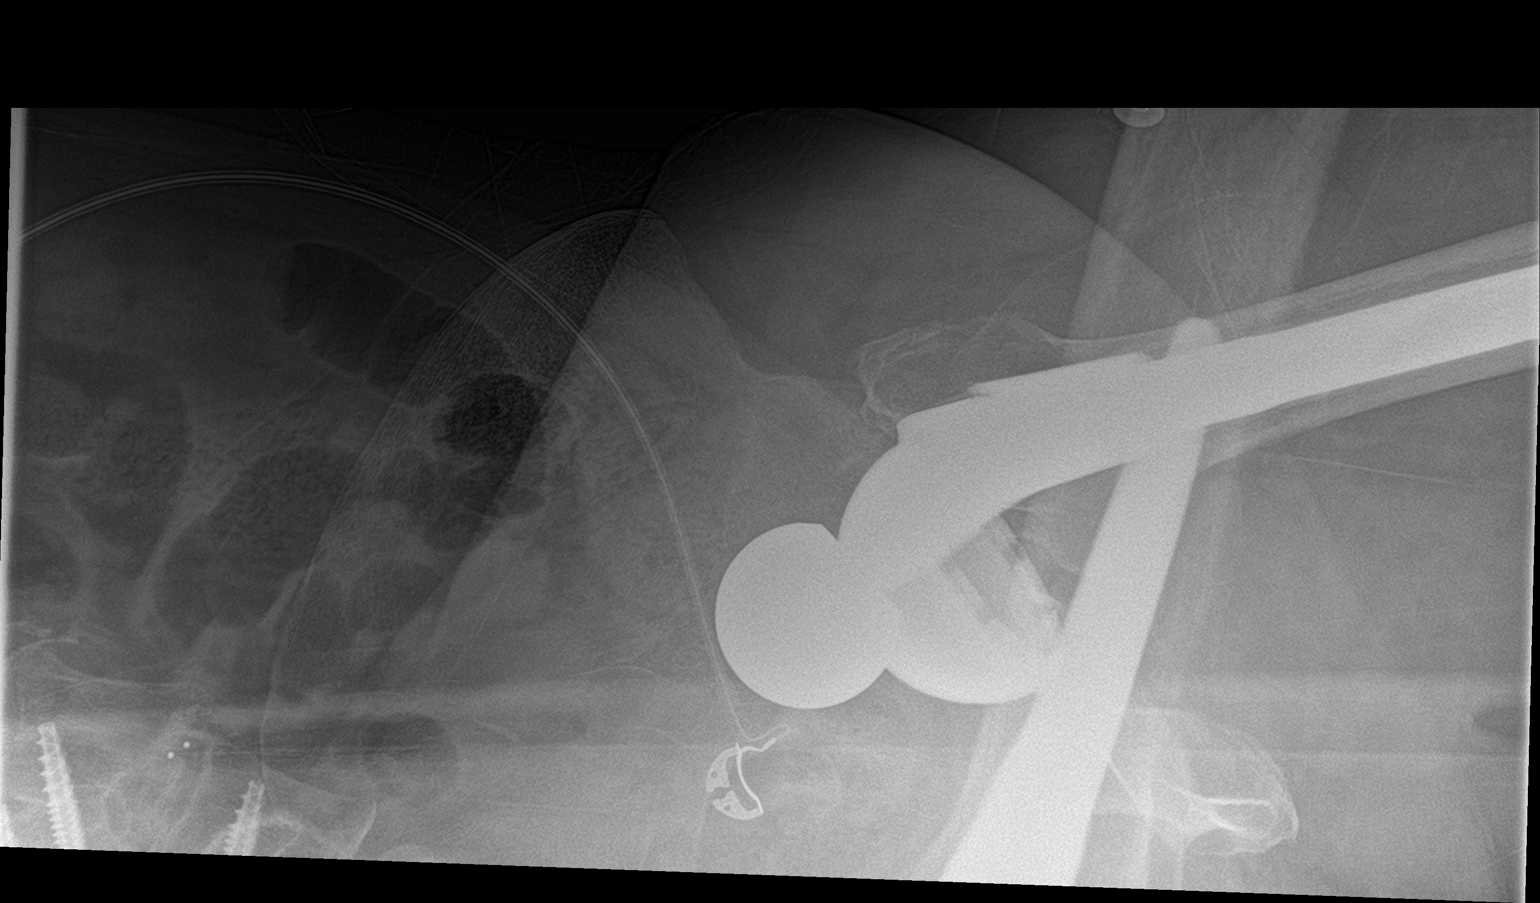

[3 of 3 positions shown; findings below may reference images not displayed]

FINDINGS: Three view study. Status post bilateral hip replacement. Femoral
component of the left hip arthroplasty is dislocated superiorly. No
evidence for periprosthetic fracture. Bones are diffusely
demineralized.
IMPRESSION: Superior dislocation of the femoral component of the left hip
arthroplasty.

## 2022-06-06 ENCOUNTER — Other Ambulatory Visit (HOSPITAL_COMMUNITY): Payer: Self-pay

## 2022-06-26 ENCOUNTER — Other Ambulatory Visit (HOSPITAL_COMMUNITY): Payer: Self-pay

## 2022-06-26 DIAGNOSIS — M5451 Vertebrogenic low back pain: Secondary | ICD-10-CM | POA: Diagnosis not present

## 2022-06-26 MED ORDER — METHYLPREDNISOLONE 4 MG PO TBPK
ORAL_TABLET | ORAL | 0 refills | Status: DC
  Filled 2022-06-26: qty 21, 6d supply, fill #0

## 2022-06-28 ENCOUNTER — Other Ambulatory Visit (HOSPITAL_COMMUNITY): Payer: Self-pay

## 2022-06-29 ENCOUNTER — Other Ambulatory Visit (HOSPITAL_COMMUNITY): Payer: Self-pay

## 2022-07-13 ENCOUNTER — Telehealth: Payer: Self-pay | Admitting: Internal Medicine

## 2022-07-13 NOTE — Telephone Encounter (Signed)
Patient wanted to know if she should keep her appt and havent had her labs done yet. Or can she do it the morning of her appt. Please advise

## 2022-07-13 NOTE — Telephone Encounter (Signed)
Left message for patient that labs need to be completed prior to appointment. She is to call back to reschedule if has not had lab work completed.

## 2022-07-16 ENCOUNTER — Ambulatory Visit: Payer: 59 | Admitting: Internal Medicine

## 2022-07-18 ENCOUNTER — Other Ambulatory Visit (HOSPITAL_COMMUNITY): Payer: Self-pay

## 2022-07-20 ENCOUNTER — Other Ambulatory Visit (HOSPITAL_COMMUNITY): Payer: Self-pay

## 2022-07-23 ENCOUNTER — Other Ambulatory Visit (HOSPITAL_COMMUNITY): Payer: Self-pay

## 2022-07-25 ENCOUNTER — Other Ambulatory Visit (HOSPITAL_COMMUNITY): Payer: Self-pay

## 2022-07-26 ENCOUNTER — Other Ambulatory Visit (HOSPITAL_COMMUNITY): Payer: Self-pay

## 2022-07-27 ENCOUNTER — Other Ambulatory Visit (HOSPITAL_COMMUNITY): Payer: Self-pay

## 2022-07-30 ENCOUNTER — Other Ambulatory Visit (HOSPITAL_COMMUNITY): Payer: Self-pay

## 2022-07-31 ENCOUNTER — Other Ambulatory Visit (HOSPITAL_COMMUNITY): Payer: Self-pay

## 2022-08-08 ENCOUNTER — Other Ambulatory Visit (HOSPITAL_COMMUNITY): Payer: Self-pay

## 2022-08-09 ENCOUNTER — Other Ambulatory Visit (HOSPITAL_COMMUNITY): Payer: Self-pay

## 2022-08-09 MED ORDER — CYCLOBENZAPRINE HCL 10 MG PO TABS
10.0000 mg | ORAL_TABLET | Freq: Every day | ORAL | 1 refills | Status: DC
  Filled 2022-08-09: qty 30, 30d supply, fill #0
  Filled 2022-09-13: qty 30, 30d supply, fill #1

## 2022-08-11 ENCOUNTER — Other Ambulatory Visit (HOSPITAL_COMMUNITY): Payer: Self-pay

## 2022-08-14 ENCOUNTER — Other Ambulatory Visit (HOSPITAL_COMMUNITY): Payer: Self-pay

## 2022-08-16 ENCOUNTER — Other Ambulatory Visit (HOSPITAL_COMMUNITY): Payer: Self-pay

## 2022-08-16 MED ORDER — LISINOPRIL-HYDROCHLOROTHIAZIDE 10-12.5 MG PO TABS
1.0000 | ORAL_TABLET | Freq: Every day | ORAL | 3 refills | Status: DC
  Filled 2022-08-16: qty 90, 90d supply, fill #0
  Filled 2022-11-05: qty 90, 90d supply, fill #1
  Filled 2023-02-11: qty 90, 90d supply, fill #2
  Filled 2023-05-18 (×2): qty 90, 90d supply, fill #3

## 2022-08-20 ENCOUNTER — Other Ambulatory Visit: Payer: Self-pay

## 2022-08-20 ENCOUNTER — Other Ambulatory Visit (HOSPITAL_COMMUNITY): Payer: Self-pay

## 2022-09-04 ENCOUNTER — Other Ambulatory Visit (HOSPITAL_COMMUNITY): Payer: Self-pay

## 2022-09-13 ENCOUNTER — Other Ambulatory Visit (HOSPITAL_COMMUNITY): Payer: Self-pay

## 2022-09-13 ENCOUNTER — Other Ambulatory Visit: Payer: Self-pay | Admitting: Pharmacist

## 2022-09-13 MED ORDER — HUMIRA (2 SYRINGE) 40 MG/0.8ML ~~LOC~~ PSKT: PREFILLED_SYRINGE | SUBCUTANEOUS | 0 refills | Status: DC

## 2022-09-13 MED ORDER — HUMIRA (2 SYRINGE) 40 MG/0.8ML ~~LOC~~ PSKT
PREFILLED_SYRINGE | SUBCUTANEOUS | 0 refills | Status: DC
  Filled 2022-09-13: qty 4, 28d supply, fill #0

## 2022-09-18 ENCOUNTER — Other Ambulatory Visit (HOSPITAL_COMMUNITY): Payer: Self-pay

## 2022-09-20 ENCOUNTER — Other Ambulatory Visit (HOSPITAL_COMMUNITY): Payer: Self-pay

## 2022-09-21 DIAGNOSIS — X32XXXD Exposure to sunlight, subsequent encounter: Secondary | ICD-10-CM | POA: Diagnosis not present

## 2022-09-21 DIAGNOSIS — B078 Other viral warts: Secondary | ICD-10-CM | POA: Diagnosis not present

## 2022-09-21 DIAGNOSIS — L570 Actinic keratosis: Secondary | ICD-10-CM | POA: Diagnosis not present

## 2022-09-21 DIAGNOSIS — L821 Other seborrheic keratosis: Secondary | ICD-10-CM | POA: Diagnosis not present

## 2022-09-25 ENCOUNTER — Other Ambulatory Visit (HOSPITAL_COMMUNITY): Payer: Self-pay

## 2022-09-25 DIAGNOSIS — M5441 Lumbago with sciatica, right side: Secondary | ICD-10-CM | POA: Diagnosis not present

## 2022-09-25 MED ORDER — OMEPRAZOLE 40 MG PO CPDR
DELAYED_RELEASE_CAPSULE | ORAL | 1 refills | Status: DC
  Filled 2022-09-25: qty 90, 90d supply, fill #0
  Filled 2023-01-03: qty 90, 90d supply, fill #1

## 2022-10-01 DIAGNOSIS — E7800 Pure hypercholesterolemia, unspecified: Secondary | ICD-10-CM | POA: Diagnosis not present

## 2022-10-01 DIAGNOSIS — F321 Major depressive disorder, single episode, moderate: Secondary | ICD-10-CM | POA: Diagnosis not present

## 2022-10-01 DIAGNOSIS — Z131 Encounter for screening for diabetes mellitus: Secondary | ICD-10-CM | POA: Diagnosis not present

## 2022-10-01 DIAGNOSIS — I10 Essential (primary) hypertension: Secondary | ICD-10-CM | POA: Diagnosis not present

## 2022-10-01 DIAGNOSIS — M479 Spondylosis, unspecified: Secondary | ICD-10-CM | POA: Diagnosis not present

## 2022-10-01 DIAGNOSIS — Z0000 Encounter for general adult medical examination without abnormal findings: Secondary | ICD-10-CM | POA: Diagnosis not present

## 2022-10-01 DIAGNOSIS — B009 Herpesviral infection, unspecified: Secondary | ICD-10-CM | POA: Diagnosis not present

## 2022-10-02 ENCOUNTER — Other Ambulatory Visit: Payer: Self-pay | Admitting: Orthopedic Surgery

## 2022-10-02 ENCOUNTER — Other Ambulatory Visit (HOSPITAL_COMMUNITY): Payer: Self-pay

## 2022-10-02 DIAGNOSIS — M545 Low back pain, unspecified: Secondary | ICD-10-CM

## 2022-10-03 ENCOUNTER — Other Ambulatory Visit (HOSPITAL_COMMUNITY): Payer: Self-pay

## 2022-10-03 DIAGNOSIS — Z79899 Other long term (current) drug therapy: Secondary | ICD-10-CM | POA: Diagnosis not present

## 2022-10-03 DIAGNOSIS — Z1589 Genetic susceptibility to other disease: Secondary | ICD-10-CM | POA: Diagnosis not present

## 2022-10-03 DIAGNOSIS — M1991 Primary osteoarthritis, unspecified site: Secondary | ICD-10-CM | POA: Diagnosis not present

## 2022-10-03 DIAGNOSIS — E663 Overweight: Secondary | ICD-10-CM | POA: Diagnosis not present

## 2022-10-03 DIAGNOSIS — M459 Ankylosing spondylitis of unspecified sites in spine: Secondary | ICD-10-CM | POA: Diagnosis not present

## 2022-10-03 DIAGNOSIS — R5383 Other fatigue: Secondary | ICD-10-CM | POA: Diagnosis not present

## 2022-10-03 DIAGNOSIS — Z6825 Body mass index (BMI) 25.0-25.9, adult: Secondary | ICD-10-CM | POA: Diagnosis not present

## 2022-10-04 ENCOUNTER — Other Ambulatory Visit: Payer: Self-pay

## 2022-10-04 ENCOUNTER — Other Ambulatory Visit (HOSPITAL_COMMUNITY): Payer: Self-pay

## 2022-10-04 MED ORDER — CYCLOBENZAPRINE HCL 10 MG PO TABS
10.0000 mg | ORAL_TABLET | Freq: Every day | ORAL | 5 refills | Status: DC
  Filled 2022-10-04 – 2022-10-10 (×2): qty 30, 30d supply, fill #0
  Filled 2022-11-05: qty 30, 30d supply, fill #1
  Filled 2022-12-05: qty 30, 30d supply, fill #2
  Filled 2023-01-03: qty 30, 30d supply, fill #3
  Filled 2023-02-11: qty 30, 30d supply, fill #4
  Filled 2023-03-11: qty 30, 30d supply, fill #5

## 2022-10-05 ENCOUNTER — Other Ambulatory Visit: Payer: Self-pay

## 2022-10-05 ENCOUNTER — Other Ambulatory Visit (HOSPITAL_BASED_OUTPATIENT_CLINIC_OR_DEPARTMENT_OTHER): Payer: Self-pay

## 2022-10-06 ENCOUNTER — Other Ambulatory Visit (HOSPITAL_COMMUNITY): Payer: Self-pay

## 2022-10-08 ENCOUNTER — Other Ambulatory Visit (HOSPITAL_COMMUNITY): Payer: Self-pay

## 2022-10-10 ENCOUNTER — Other Ambulatory Visit: Payer: Self-pay

## 2022-10-10 ENCOUNTER — Other Ambulatory Visit (HOSPITAL_COMMUNITY): Payer: Self-pay

## 2022-10-10 ENCOUNTER — Other Ambulatory Visit: Payer: Self-pay | Admitting: Pharmacist

## 2022-10-10 MED ORDER — HUMIRA (2 SYRINGE) 40 MG/0.8ML ~~LOC~~ PSKT: PREFILLED_SYRINGE | SUBCUTANEOUS | 5 refills | Status: DC

## 2022-10-10 MED ORDER — HUMIRA (2 SYRINGE) 40 MG/0.8ML ~~LOC~~ PSKT
PREFILLED_SYRINGE | SUBCUTANEOUS | 5 refills | Status: DC
  Filled 2022-10-10: qty 4, 28d supply, fill #0
  Filled 2022-11-13: qty 4, 28d supply, fill #1
  Filled 2022-12-11: qty 4, 28d supply, fill #2
  Filled 2023-01-09: qty 4, 28d supply, fill #3
  Filled 2023-02-11: qty 4, 28d supply, fill #4
  Filled 2023-03-13: qty 4, 28d supply, fill #5

## 2022-10-17 ENCOUNTER — Other Ambulatory Visit: Payer: Self-pay

## 2022-10-24 ENCOUNTER — Other Ambulatory Visit (HOSPITAL_COMMUNITY): Payer: Self-pay

## 2022-10-24 MED ORDER — CELECOXIB 200 MG PO CAPS
200.0000 mg | ORAL_CAPSULE | Freq: Two times a day (BID) | ORAL | 1 refills | Status: DC
  Filled 2022-10-24: qty 180, 90d supply, fill #0
  Filled 2023-01-12: qty 180, 90d supply, fill #1

## 2022-10-25 ENCOUNTER — Other Ambulatory Visit (HOSPITAL_COMMUNITY): Payer: Self-pay

## 2022-10-26 ENCOUNTER — Ambulatory Visit: Payer: Commercial Managed Care - PPO | Attending: Internal Medicine | Admitting: Internal Medicine

## 2022-10-26 ENCOUNTER — Other Ambulatory Visit (HOSPITAL_COMMUNITY): Payer: Self-pay

## 2022-10-31 ENCOUNTER — Other Ambulatory Visit (HOSPITAL_COMMUNITY): Payer: Self-pay

## 2022-11-06 ENCOUNTER — Other Ambulatory Visit (HOSPITAL_COMMUNITY): Payer: Self-pay

## 2022-11-07 ENCOUNTER — Other Ambulatory Visit (HOSPITAL_COMMUNITY): Payer: Self-pay

## 2022-11-08 ENCOUNTER — Other Ambulatory Visit (HOSPITAL_COMMUNITY): Payer: Self-pay

## 2022-11-13 ENCOUNTER — Other Ambulatory Visit (HOSPITAL_COMMUNITY): Payer: Self-pay

## 2022-11-14 ENCOUNTER — Other Ambulatory Visit (HOSPITAL_COMMUNITY): Payer: Self-pay

## 2022-11-15 ENCOUNTER — Other Ambulatory Visit (HOSPITAL_COMMUNITY): Payer: Self-pay

## 2022-11-16 ENCOUNTER — Other Ambulatory Visit (HOSPITAL_COMMUNITY): Payer: Self-pay

## 2022-12-11 ENCOUNTER — Other Ambulatory Visit (HOSPITAL_COMMUNITY): Payer: Self-pay

## 2022-12-12 ENCOUNTER — Other Ambulatory Visit (HOSPITAL_COMMUNITY): Payer: Self-pay

## 2022-12-18 ENCOUNTER — Other Ambulatory Visit (HOSPITAL_COMMUNITY): Payer: Self-pay

## 2022-12-19 ENCOUNTER — Other Ambulatory Visit (HOSPITAL_COMMUNITY): Payer: Self-pay

## 2022-12-19 MED ORDER — VENLAFAXINE HCL ER 150 MG PO CP24
150.0000 mg | ORAL_CAPSULE | Freq: Every day | ORAL | 3 refills | Status: DC
  Filled 2022-12-19 (×2): qty 90, 90d supply, fill #0
  Filled 2023-03-12: qty 90, 90d supply, fill #1
  Filled 2023-06-18: qty 90, 90d supply, fill #2
  Filled 2023-09-12: qty 90, 90d supply, fill #3

## 2022-12-19 MED ORDER — ACYCLOVIR 200 MG PO CAPS
200.0000 mg | ORAL_CAPSULE | Freq: Three times a day (TID) | ORAL | 0 refills | Status: DC
  Filled 2022-12-19: qty 270, 90d supply, fill #0

## 2022-12-20 ENCOUNTER — Other Ambulatory Visit (HOSPITAL_COMMUNITY): Payer: Self-pay

## 2022-12-20 ENCOUNTER — Other Ambulatory Visit: Payer: Self-pay

## 2022-12-21 ENCOUNTER — Other Ambulatory Visit (HOSPITAL_COMMUNITY): Payer: Self-pay

## 2023-01-09 ENCOUNTER — Other Ambulatory Visit (HOSPITAL_COMMUNITY): Payer: Self-pay

## 2023-01-11 ENCOUNTER — Other Ambulatory Visit: Payer: Self-pay

## 2023-01-11 ENCOUNTER — Other Ambulatory Visit (HOSPITAL_COMMUNITY): Payer: Self-pay

## 2023-02-08 ENCOUNTER — Other Ambulatory Visit (HOSPITAL_COMMUNITY): Payer: Self-pay

## 2023-02-11 ENCOUNTER — Other Ambulatory Visit: Payer: Self-pay | Admitting: Internal Medicine

## 2023-02-11 ENCOUNTER — Other Ambulatory Visit: Payer: Self-pay

## 2023-02-11 ENCOUNTER — Other Ambulatory Visit (HOSPITAL_COMMUNITY): Payer: Self-pay

## 2023-02-11 MED ORDER — EZETIMIBE 10 MG PO TABS
10.0000 mg | ORAL_TABLET | Freq: Every day | ORAL | 3 refills | Status: DC
  Filled 2023-02-11: qty 90, 90d supply, fill #0
  Filled 2023-05-18 (×2): qty 90, 90d supply, fill #1
  Filled 2023-08-14: qty 90, 90d supply, fill #2
  Filled 2023-11-21: qty 90, 90d supply, fill #3

## 2023-02-12 ENCOUNTER — Other Ambulatory Visit: Payer: Self-pay

## 2023-02-15 ENCOUNTER — Other Ambulatory Visit (HOSPITAL_COMMUNITY): Payer: Self-pay

## 2023-03-06 LAB — NMR, LIPOPROFILE
Cholesterol, Total: 159 mg/dL (ref 100–199)
HDL Particle Number: 37.8 umol/L (ref >=30.5)
HDL-C: 46 mg/dL (ref >39)
LDL Particle Number: 891 nmol/L (ref <1000)
LDL Size: 20 nm — ABNORMAL LOW (ref >20.5)
LDL-C (NIH Calc): 74 mg/dL (ref 0–99)
LP-IR Score: 74 — ABNORMAL HIGH (ref <=45)
Small LDL Particle Number: 608 nmol/L — ABNORMAL HIGH (ref <=527)
Triglycerides: 236 mg/dL — ABNORMAL HIGH (ref 0–149)

## 2023-03-08 ENCOUNTER — Ambulatory Visit: Payer: 59 | Attending: Internal Medicine | Admitting: Internal Medicine

## 2023-03-08 ENCOUNTER — Encounter: Payer: Self-pay | Admitting: Internal Medicine

## 2023-03-08 VITALS — BP 118/76 | HR 89 | Ht 64.0 in | Wt 145.2 lb

## 2023-03-08 DIAGNOSIS — T466X5D Adverse effect of antihyperlipidemic and antiarteriosclerotic drugs, subsequent encounter: Secondary | ICD-10-CM | POA: Diagnosis not present

## 2023-03-08 DIAGNOSIS — E7849 Other hyperlipidemia: Secondary | ICD-10-CM

## 2023-03-08 DIAGNOSIS — T466X5A Adverse effect of antihyperlipidemic and antiarteriosclerotic drugs, initial encounter: Secondary | ICD-10-CM

## 2023-03-08 DIAGNOSIS — R931 Abnormal findings on diagnostic imaging of heart and coronary circulation: Secondary | ICD-10-CM

## 2023-03-08 DIAGNOSIS — M791 Myalgia, unspecified site: Secondary | ICD-10-CM | POA: Diagnosis not present

## 2023-03-08 NOTE — Progress Notes (Signed)
LIPID CLINIC CONSULT NOTE  Chief Complaint:  Follow-up dyslipidemia  Primary Care Physician: Darrow Bussing, MD  Primary Cardiologist:  None  HPI:  Adrienne Campbell is a 62 y.o. female who is being seen today for the evaluation of dyslipidemia at the request of Koirala, Dibas, MD. 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  She has past medical history significant for dyslipidemia and has been intolerant to statins.  Most recent lipids show significantly elevated cholesterol with total 361, triglycerides 404, HDL 54 and LDL 221.  She also reports family history of coronary disease including her mom who had an MI and a stroke in her father who had two-vessel bypass.  Her paternal grandfather also died of an MI at an earlier age and her brother had an MI believe in his 73s.  Based on this there is a strong suspicion for familial hyperlipidemia.  According to her primary care provider she had previously been on rosuvastatin at various doses and atorvastatin including high intensity doses and was not able to tolerate the medications due to myalgias/severe leg cramps.  She reports a healthy diet that is pescatarian and tried to avoid fats.  03/07/2022  Adrienne Campbell returns today for follow-up.  She has had a marked improvement in her LDL particle numbers.  LDL-P is now 1223, LDL-C of 95 (down from 221), HDL is 50 and triglycerides are 303.  Her LP(a) was elevated at 139.  Her calcium score did come back abnormal with a calcium score 78.2, 86th percentile for age and sex matched controls.  I discussed the meaning of this with her today in the office.  Overall I am pleased with her lipid lowering however I think we have reached the maximal benefit of that medication.  She still remains above ideal targets.  03/08/2023  Adrienne Campbell is seen today for follow-up.  She seems to be doing well with the addition of ezetimibe.  She  remains on Repatha.  She has had additional lipid lowering with this combination.  Her LDL particle number is now 891 down from 1223, LDL is 74, down from 95, HDL 46 and triglycerides 236 again improved from earlier measurements.  Overall I think this is a good combination of medications for her although her lipids are still slightly elevated.  PMHx:  Past Medical History:  Diagnosis Date   Anemia    Anxiety    Arthritis    Depression    Environmental and seasonal allergies    Heart murmur    during pregnancy   Hyperlipidemia    Hypertension    Insomnia    Pneumonia    hx of   PONV (postoperative nausea and vomiting)    Nausea, per pt. "felt loopy after hip surgery"   and made hallucinate    Past Surgical History:  Procedure Laterality Date   ABDOMINAL HYSTERECTOMY     COLONOSCOPY     HIP CLOSED REDUCTION Left 12/03/2021   Procedure: CLOSED REDUCTION HIP;  Surgeon: Jones Broom, MD;  [facility];  Service: Orthopedics;  Laterality: Left;   TOTAL HIP ARTHROPLASTY Left 06/28/2014   Procedure: LEFT TOTAL HIP ARTHROPLASTY;  Surgeon: Nestor Lewandowsky, MD;  Location: [facility];  Service: Orthopedics;  Laterality: Left;   TOTAL HIP ARTHROPLASTY Right 04/27/2019   Procedure: Right Anterior Hip Arthroplasty;  Surgeon: Gean Birchwood, MD;  Location: [facility];  Service: Orthopedics;  Laterality: Right;   TOTAL KNEE  ARTHROPLASTY Right 08/11/2014   Procedure: RIGHT TOTAL KNEE ARTHROPLASTY;  Surgeon: Nestor Lewandowsky, MD;  [facility];  Service: Orthopedics;  Laterality: Right;   TRANSFORAMINAL LUMBAR INTERBODY FUSION (TLIF) WITH PEDICLE SCREW FIXATION 1 LEVEL Right 11/12/2018   Procedure: RIGHT-SIDED LUMBAR 4-5 TRANSFORAMINAL LUMBAR INTERBODY FUSION WITH INSTRUMENTATION AND ALLOGRAFT;  Surgeon: Estill Bamberg, MD;  Location[facility];  Service: Orthopedics;  Laterality: Right;    FAMHx:  Family History  Problem Relation Age of Onset   Stroke Mother    Heart attack Mother    Coronary artery disease  Father    Heart attack Brother    Heart attack Maternal Grandfather     SOCHx:   reports that she has never smoked. She has never used smokeless tobacco. She reports current alcohol use. She reports that she does not use drugs.  ALLERGIES:  Allergies  Allergen Reactions   Nsaids Hives    Specifically meloxicam and ibuprofen reported   Onion Swelling    SWELLING REACTION UNSPECIFIED    Sulfa Antibiotics Swelling    Patient reports tongue swells   Fluticasone Other (See Comments)    ulcers   Oxycodone Nausea Only    Severe nausea; pt has to take with Zofran   Tramadol Hives   Hydrocodone-Acetaminophen Other (See Comments)    Hallucinations   Ibuprofen     Other reaction(s): hives/itching   Meloxicam     Other reaction(s): pruritus   Rosuvastatin Calcium     Other reaction(s): cramps   Tizanidine Hcl     Other reaction(s): Hallucinations   Codeine Nausea And Vomiting    ROS: Pertinent items noted in HPI and remainder of comprehensive ROS otherwise negative.  HOME MEDS: Current Outpatient Medications on File Prior to Visit  Medication Sig Dispense Refill   acetaminophen (TYLENOL) 650 MG CR tablet 1,300 mg daily as needed for pain.     acyclovir (ZOVIRAX) 200 MG capsule Take 1 capsule (200 mg total) by mouth 3 (three) times daily for 5 days for recurrence. 360 capsule 0   Adalimumab (HUMIRA, 2 SYRINGE,) 40 MG/0.8ML PSKT Inject 0.8 mL Subcutaneous once a week. 4 each 5   alclomethasone (ACLOVATE) 0.05 % cream Apply topically to the affected areas up to 2 times a day as needed 60 g 3   celecoxib (CELEBREX) 200 MG capsule Take 1 capsule by mouth 2 times a day 180 capsule 1   cholecalciferol (VITAMIN D) 25 MCG (1000 UT) tablet Take 1,000 Units by mouth daily.     CITRUS BERGAMOT PO Take 1 tablet by mouth every morning.      Cyanocobalamin (VITAMIN B-12) 5000 MCG TBDP Take 5,000 mcg by mouth daily.     cyclobenzaprine (FLEXERIL) 10 MG tablet Take 1 tablet (10 mg total) by mouth  at bedtime. 30 tablet 5   Dapsone 5 % topical gel Apply 1 application topically daily as needed.     estradiol (ESTRACE) 0.1 MG/GM vaginal cream Apply  1/2 to 1 gram at the vaginal introitus at bedtime twice a week as directed. 42.5 g 3   Estradiol (VAGIFEM) 10 MCG TABS vaginal tablet Insert 1 tablet vaginally 2 times a week 24 tablet 3   Evolocumab (REPATHA SURECLICK) 140 MG/ML SOAJ Inject 140 mg into the skin every 14 (fourteen) days. 6 mL 3   ezetimibe (ZETIA) 10 MG tablet Take 1 tablet by mouth daily. 90 tablet 3   hydrOXYzine (VISTARIL) 25 MG capsule Take 1 - 2 capsules by mouth at  bedtime 135 capsule 3   lisinopril-hydrochlorothiazide (ZESTORETIC) 10-12.5 MG tablet Take 1 tablet by daily 90 tablet 3   methylPREDNISolone (MEDROL) 4 MG TBPK tablet Take as directed per package instructions 21 tablet 0   Omega-3 1000 MG CAPS Take 1,000 mg by mouth at bedtime.     omeprazole (PRILOSEC) 40 MG capsule Take 1 capsule by mouth 30 minutes before morning meal once daily 90 capsule 1   Polyethyl Glycol-Propyl Glycol 0.4-0.3 % SOLN Place 1 drop into both eyes 4 (four) times daily as needed (dry/irritated eyes).     venlafaxine XR (EFFEXOR-XR) 150 MG 24 hr capsule Take 1 capsule (150 mg total) by mouth daily. 90 capsule 3   vitamin E 400 UNIT capsule Take 400 Units by mouth at bedtime.      Cetirizine HCl (ZYRTEC ALLERGY) 10 MG CAPS as needed. (Patient not taking: Reported on 03/08/2023)     lisinopril-hydrochlorothiazide (ZESTORETIC) 10-12.5 MG tablet Take 1 tablet by mouth daily. (Patient not taking: Reported on 03/08/2023) 90 tablet 3   polyethylene glycol-electrolytes (NULYTELY) 420 g solution Use as directed 4000 mL 0   No current facility-administered medications on file prior to visit.    LABS/IMAGING: No results found for this or any previous visit (from the past 48 hour(s)). No results found.  LIPID PANEL: No results found for: "CHOL", "TRIG", "HDL", "CHOLHDL", "VLDL", "LDLCALC",  "LDLDIRECT"  WEIGHTS: Wt Readings from Last 3 Encounters:  03/08/23 145 lb 3.2 oz (65.9 kg)  03/07/22 149 lb 3.2 oz (67.7 kg)  12/03/21 150 lb (68 kg)    VITALS: BP 118/76 (BP Location: Left Arm, Patient Position: Sitting, Cuff Size: Normal)   Pulse 89   Ht 5\' 4"  (1.626 m)   Wt 145 lb 3.2 oz (65.9 kg)   SpO2 98%   BMI 24.92 kg/m   EXAM: Deferred  EKG: N/A  ASSESSMENT: Probable familial hyperlipidemia by Simon-Broome criteria, LDL greater than 190 Family history of multiple family members with premature coronary disease and high cholesterol Statin intolerance-myalgias/cramps CAC score of 78.2, 86 percentile for age and sex matched controls disease (11/2021)  PLAN: 1.   Adrienne Campbell has derive additional benefit from the addition of Zetia to her Repatha.  Her LDL started much greater than 190 and currently is down to 74.  This is a marked reduction I think should make a significant benefit as far as lowering her risk.  Will plan to continue this combination therapy and follow-up with me annually or sooner as necessary.  Chrystie Nose, MD, [facility], FACP  [place]  [facility]  Medical Director of the Advanced Lipid Disorders &  Cardiovascular Risk Reduction Clinic Diplomate of the American Board of Clinical Lipidology Attending Cardiologist  Direct Dial: [phone]  Fax: [phone]  Website:  [email or web address]  Lisette Abu Jevan Gaunt 03/08/2023, 8:18 AM

## 2023-03-08 NOTE — Patient Instructions (Signed)
Medication Instructions:  No changes   If you need a refill on your cardiac medications before your next appointment, please call your pharmacy.   Lab work: None needed  Testing/Procedures: None needed  Follow-Up: At [facility], you and your health needs are our priority.  As part of our continuing mission to provide you with exceptional heart care, we have created designated Provider Care Teams.  These Care Teams include your primary Cardiologist (physician) and Advanced Practice Providers (APPs -  Physician Assistants and Nurse Practitioners) who all work together to provide you with the care you need, when you need it. You will need a follow up appointment in   12  months.  Please call our office 2 months in advance to schedule this appointment.

## 2023-03-12 ENCOUNTER — Other Ambulatory Visit (HOSPITAL_COMMUNITY): Payer: Self-pay

## 2023-03-13 ENCOUNTER — Other Ambulatory Visit (HOSPITAL_COMMUNITY): Payer: Self-pay

## 2023-03-13 ENCOUNTER — Other Ambulatory Visit: Payer: Self-pay

## 2023-03-15 ENCOUNTER — Other Ambulatory Visit (HOSPITAL_COMMUNITY): Payer: Self-pay

## 2023-03-18 ENCOUNTER — Other Ambulatory Visit (HOSPITAL_COMMUNITY): Payer: Self-pay

## 2023-04-02 ENCOUNTER — Other Ambulatory Visit (HOSPITAL_COMMUNITY): Payer: Self-pay

## 2023-04-02 MED ORDER — HYDROXYZINE PAMOATE 25 MG PO CAPS
25.0000 mg | ORAL_CAPSULE | Freq: Every day | ORAL | 3 refills | Status: DC
  Filled 2023-04-02: qty 90, 90d supply, fill #0
  Filled 2023-06-18: qty 90, 90d supply, fill #1

## 2023-04-03 DIAGNOSIS — Z1589 Genetic susceptibility to other disease: Secondary | ICD-10-CM | POA: Diagnosis not present

## 2023-04-03 DIAGNOSIS — M1991 Primary osteoarthritis, unspecified site: Secondary | ICD-10-CM | POA: Diagnosis not present

## 2023-04-03 DIAGNOSIS — M459 Ankylosing spondylitis of unspecified sites in spine: Secondary | ICD-10-CM | POA: Diagnosis not present

## 2023-04-03 DIAGNOSIS — Z6824 Body mass index (BMI) 24.0-24.9, adult: Secondary | ICD-10-CM | POA: Diagnosis not present

## 2023-04-03 DIAGNOSIS — Z79899 Other long term (current) drug therapy: Secondary | ICD-10-CM | POA: Diagnosis not present

## 2023-04-04 ENCOUNTER — Other Ambulatory Visit (HOSPITAL_COMMUNITY): Payer: Self-pay

## 2023-04-05 ENCOUNTER — Telehealth: Payer: Self-pay | Admitting: Pharmacist

## 2023-04-05 NOTE — Telephone Encounter (Signed)
Called patient to schedule an appointment for the [employer] [facility]. I was unable to reach the patient so I left a HIPAA-compliant message requesting that the patient return my call.   Luke Van Ausdall, PharmD, BCACP, CPP Clinical Pharmacist [facility] [phone]  

## 2023-04-06 ENCOUNTER — Other Ambulatory Visit (HOSPITAL_COMMUNITY): Payer: Self-pay

## 2023-04-08 ENCOUNTER — Other Ambulatory Visit (HOSPITAL_COMMUNITY): Payer: Self-pay

## 2023-04-08 MED ORDER — CYCLOBENZAPRINE HCL 10 MG PO TABS
10.0000 mg | ORAL_TABLET | Freq: Every day | ORAL | 5 refills | Status: DC
  Filled 2023-04-08: qty 30, 30d supply, fill #0
  Filled 2023-05-18 (×2): qty 30, 30d supply, fill #1
  Filled 2023-06-12: qty 30, 30d supply, fill #2
  Filled 2023-07-03 – 2023-07-06 (×2): qty 30, 30d supply, fill #3
  Filled 2023-08-14: qty 30, 30d supply, fill #4
  Filled 2023-09-12: qty 30, 30d supply, fill #5

## 2023-04-10 ENCOUNTER — Other Ambulatory Visit (HOSPITAL_COMMUNITY): Payer: Self-pay

## 2023-04-11 ENCOUNTER — Ambulatory Visit: Payer: 59 | Attending: Family Medicine | Admitting: Pharmacist

## 2023-04-11 ENCOUNTER — Other Ambulatory Visit (HOSPITAL_COMMUNITY): Payer: Self-pay

## 2023-04-11 ENCOUNTER — Other Ambulatory Visit: Payer: Self-pay

## 2023-04-11 DIAGNOSIS — Z7189 Other specified counseling: Secondary | ICD-10-CM

## 2023-04-11 MED ORDER — HUMIRA (2 SYRINGE) 40 MG/0.8ML ~~LOC~~ PSKT
PREFILLED_SYRINGE | SUBCUTANEOUS | 4 refills | Status: DC
  Filled 2023-04-11 (×2): qty 4, 28d supply, fill #0
  Filled 2023-05-07: qty 4, 28d supply, fill #1
  Filled 2023-06-06: qty 4, 28d supply, fill #2
  Filled 2023-07-05: qty 4, 28d supply, fill #3
  Filled 2023-07-26: qty 4, 28d supply, fill #4

## 2023-04-11 MED ORDER — HUMIRA (2 SYRINGE) 40 MG/0.8ML ~~LOC~~ PSKT: PREFILLED_SYRINGE | SUBCUTANEOUS | 4 refills | Status: DC

## 2023-04-11 NOTE — Progress Notes (Signed)
  S: Patient presents for review of their specialty medication therapy.  Patient is currently taking Humira for ankylosing spondylitis. Patient is managed by Zenovia Jordan for this.   Adherence: denies any missed doses  Efficacy: working well for her  Dosing:  Ankylosing spondylitis: SubQ: 40 mg every week  Dose adjustments: Renal: no dose adjustments (has not been studied) Hepatic: no dose adjustments (has not been studied)  Screening: TB test: completed per patient; negative Hepatitis: completed per patient  Monitoring: S/sx of infection: denies CBC: followed by rheum, has been WNL S/sx of hypersensitivity: denies S/sx of malignancy: denies S/sx of heart failure: denies Recently underwent testing for Ab, and this was negative  O:      Lab Results  Component Value Date   WBC 12.2 (H) 12/03/2021   HGB 11.7 (L) 12/03/2021   HCT 34.9 (L) 12/03/2021   MCV 103.3 (H) 12/03/2021   PLT 326 12/03/2021      Chemistry      Component Value Date/Time   NA 136 12/03/2021 1405   K 4.5 12/03/2021 1405   CL 105 12/03/2021 1405   CO2 25 12/03/2021 1405   BUN 10 12/03/2021 1405   CREATININE 0.62 12/03/2021 1405      Component Value Date/Time   CALCIUM 8.6 (L) 12/03/2021 1405   ALKPHOS 70 11/07/2018 1329   AST 21 11/07/2018 1329   ALT 15 11/07/2018 1329   BILITOT 0.5 11/07/2018 1329     A/P: 1. Medication review: Patient currently on Humira for ankylosing spondylitis. Reviewed the medication with the patient, including the following: Humira is a TNF blocking agent indicated for ankylosing spondylitis, Crohn's disease, Hidradenitis suppurativa, psoriatic arthritis, plaque psoriasis, ulcerative colitis, and uveitis. Patient educated on purpose, proper use and potential adverse effects of Humira. The most common adverse effects are infections, headache, and injection site reactions. There is the possibility of an increased risk of malignancy but it is not well understood if this  increased risk is due to there medication or the disease state. There are rare cases of pancytopenia and aplastic anemia. No recommendations for any changes at this time.  Butch Penny, PharmD, [place], CPP Clinical Pharmacist [facility] [phone]

## 2023-04-12 ENCOUNTER — Other Ambulatory Visit (HOSPITAL_COMMUNITY): Payer: Self-pay

## 2023-04-15 ENCOUNTER — Other Ambulatory Visit (HOSPITAL_COMMUNITY): Payer: Self-pay

## 2023-04-16 ENCOUNTER — Other Ambulatory Visit (HOSPITAL_COMMUNITY): Payer: Self-pay

## 2023-04-16 MED ORDER — OMEPRAZOLE 40 MG PO CPDR
40.0000 mg | DELAYED_RELEASE_CAPSULE | Freq: Every day | ORAL | 1 refills | Status: DC
  Filled 2023-04-16: qty 90, 90d supply, fill #0
  Filled 2023-07-18: qty 90, 90d supply, fill #1

## 2023-04-17 ENCOUNTER — Other Ambulatory Visit: Payer: Self-pay | Admitting: Internal Medicine

## 2023-04-17 ENCOUNTER — Other Ambulatory Visit (HOSPITAL_COMMUNITY): Payer: Self-pay

## 2023-04-17 DIAGNOSIS — E7800 Pure hypercholesterolemia, unspecified: Secondary | ICD-10-CM

## 2023-04-17 DIAGNOSIS — E7849 Other hyperlipidemia: Secondary | ICD-10-CM

## 2023-04-18 ENCOUNTER — Other Ambulatory Visit (HOSPITAL_COMMUNITY): Payer: Self-pay

## 2023-04-18 MED ORDER — REPATHA SURECLICK 140 MG/ML ~~LOC~~ SOAJ
140.0000 mg | SUBCUTANEOUS | 3 refills | Status: DC
  Filled 2023-04-18: qty 6, 84d supply, fill #0
  Filled 2023-07-11: qty 6, 84d supply, fill #1
  Filled 2023-10-04: qty 2, 28d supply, fill #2
  Filled 2023-10-31: qty 2, 28d supply, fill #3
  Filled 2023-11-25: qty 2, 28d supply, fill #4
  Filled 2023-12-16 – 2023-12-25 (×2): qty 2, 28d supply, fill #5
  Filled 2024-02-03: qty 4, 56d supply, fill #6

## 2023-04-19 ENCOUNTER — Other Ambulatory Visit (HOSPITAL_COMMUNITY): Payer: Self-pay

## 2023-04-19 MED ORDER — ESTRADIOL 10 MCG VA TABS
ORAL_TABLET | VAGINAL | 3 refills | Status: DC
  Filled 2023-04-19: qty 24, 84d supply, fill #0
  Filled 2023-07-19: qty 24, 84d supply, fill #1
  Filled 2023-11-22: qty 24, 84d supply, fill #2
  Filled 2024-02-18: qty 24, 84d supply, fill #3

## 2023-04-19 MED ORDER — ESTRADIOL 0.1 MG/GM VA CREA
TOPICAL_CREAM | VAGINAL | 3 refills | Status: DC
  Filled 2023-04-19: qty 42.5, 30d supply, fill #0

## 2023-04-20 ENCOUNTER — Other Ambulatory Visit (HOSPITAL_COMMUNITY): Payer: Self-pay

## 2023-04-22 ENCOUNTER — Other Ambulatory Visit (HOSPITAL_COMMUNITY): Payer: Self-pay

## 2023-04-22 MED ORDER — CELECOXIB 200 MG PO CAPS
200.0000 mg | ORAL_CAPSULE | Freq: Two times a day (BID) | ORAL | 1 refills | Status: DC
  Filled 2023-04-22: qty 180, 90d supply, fill #0
  Filled 2023-07-13: qty 180, 90d supply, fill #1

## 2023-05-07 ENCOUNTER — Other Ambulatory Visit (HOSPITAL_COMMUNITY): Payer: Self-pay

## 2023-05-13 ENCOUNTER — Other Ambulatory Visit (HOSPITAL_COMMUNITY): Payer: Self-pay

## 2023-05-18 ENCOUNTER — Other Ambulatory Visit (HOSPITAL_COMMUNITY): Payer: Self-pay

## 2023-05-20 ENCOUNTER — Other Ambulatory Visit (HOSPITAL_COMMUNITY): Payer: Self-pay

## 2023-05-20 DIAGNOSIS — R3914 Feeling of incomplete bladder emptying: Secondary | ICD-10-CM | POA: Diagnosis not present

## 2023-05-20 DIAGNOSIS — N393 Stress incontinence (female) (male): Secondary | ICD-10-CM | POA: Diagnosis not present

## 2023-05-20 DIAGNOSIS — N952 Postmenopausal atrophic vaginitis: Secondary | ICD-10-CM | POA: Diagnosis not present

## 2023-05-20 DIAGNOSIS — N3020 Other chronic cystitis without hematuria: Secondary | ICD-10-CM | POA: Diagnosis not present

## 2023-05-20 DIAGNOSIS — R3982 Chronic bladder pain: Secondary | ICD-10-CM | POA: Diagnosis not present

## 2023-05-25 ENCOUNTER — Encounter (HOSPITAL_COMMUNITY): Payer: Self-pay

## 2023-06-06 ENCOUNTER — Other Ambulatory Visit: Payer: Self-pay

## 2023-06-06 ENCOUNTER — Other Ambulatory Visit (HOSPITAL_COMMUNITY): Payer: Self-pay

## 2023-06-06 NOTE — Progress Notes (Signed)
Specialty Pharmacy Refill Coordination Note  Adrienne Campbell is a 62 y.o. female contacted today regarding refills of specialty medication(s) Adalimumab   Patient requested Adrienne Campbell at [facility] at [place] date: 06/13/23   Medication will be filled on 06/12/23.

## 2023-06-12 ENCOUNTER — Other Ambulatory Visit (HOSPITAL_COMMUNITY): Payer: Self-pay

## 2023-06-18 ENCOUNTER — Other Ambulatory Visit (HOSPITAL_COMMUNITY): Payer: Self-pay

## 2023-06-19 ENCOUNTER — Other Ambulatory Visit (HOSPITAL_COMMUNITY): Payer: Self-pay

## 2023-06-25 ENCOUNTER — Other Ambulatory Visit (HOSPITAL_COMMUNITY): Payer: Self-pay

## 2023-06-25 DIAGNOSIS — J385 Laryngeal spasm: Secondary | ICD-10-CM | POA: Diagnosis not present

## 2023-06-25 DIAGNOSIS — F424 Excoriation (skin-picking) disorder: Secondary | ICD-10-CM | POA: Diagnosis not present

## 2023-06-25 DIAGNOSIS — J3489 Other specified disorders of nose and nasal sinuses: Secondary | ICD-10-CM | POA: Diagnosis not present

## 2023-06-25 MED ORDER — MUPIROCIN 2 % EX OINT
TOPICAL_OINTMENT | CUTANEOUS | 0 refills | Status: DC
  Filled 2023-06-25: qty 22, 5d supply, fill #0

## 2023-06-26 ENCOUNTER — Other Ambulatory Visit (HOSPITAL_COMMUNITY): Payer: Self-pay

## 2023-07-01 ENCOUNTER — Ambulatory Visit (INDEPENDENT_AMBULATORY_CARE_PROVIDER_SITE_OTHER): Payer: 59 | Admitting: Otolaryngology

## 2023-07-01 ENCOUNTER — Encounter (INDEPENDENT_AMBULATORY_CARE_PROVIDER_SITE_OTHER): Payer: Self-pay | Admitting: Otolaryngology

## 2023-07-01 ENCOUNTER — Other Ambulatory Visit: Payer: Self-pay

## 2023-07-01 ENCOUNTER — Other Ambulatory Visit (HOSPITAL_COMMUNITY): Payer: Self-pay

## 2023-07-01 VITALS — BP 136/81 | HR 63 | Ht 64.0 in | Wt 145.0 lb

## 2023-07-01 DIAGNOSIS — R0689 Other abnormalities of breathing: Secondary | ICD-10-CM

## 2023-07-01 DIAGNOSIS — J383 Other diseases of vocal cords: Secondary | ICD-10-CM

## 2023-07-01 DIAGNOSIS — R061 Stridor: Secondary | ICD-10-CM | POA: Diagnosis not present

## 2023-07-01 DIAGNOSIS — K219 Gastro-esophageal reflux disease without esophagitis: Secondary | ICD-10-CM | POA: Diagnosis not present

## 2023-07-01 DIAGNOSIS — R0609 Other forms of dyspnea: Secondary | ICD-10-CM

## 2023-07-01 DIAGNOSIS — R0981 Nasal congestion: Secondary | ICD-10-CM

## 2023-07-01 DIAGNOSIS — J3089 Other allergic rhinitis: Secondary | ICD-10-CM | POA: Diagnosis not present

## 2023-07-01 DIAGNOSIS — R0982 Postnasal drip: Secondary | ICD-10-CM | POA: Diagnosis not present

## 2023-07-01 DIAGNOSIS — J385 Laryngeal spasm: Secondary | ICD-10-CM

## 2023-07-01 MED ORDER — AZELASTINE HCL 0.1 % NA SOLN
2.0000 | Freq: Two times a day (BID) | NASAL | 12 refills | Status: DC
  Filled 2023-07-01: qty 30, 30d supply, fill #0

## 2023-07-01 MED ORDER — CETIRIZINE HCL 10 MG PO TABS
10.0000 mg | ORAL_TABLET | Freq: Every day | ORAL | 11 refills | Status: DC
  Filled 2023-07-01: qty 30, 30d supply, fill #0

## 2023-07-01 NOTE — Progress Notes (Unsigned)
ENT CONSULT:  Reason for Consult: hx of noisy breathing and episodes of dyspnea concern for laryngospasm   HPI: Adrienne Campbell is an 62 y.o. female with hx of laryngospasm diagnosed approximately 4-5 yrs ago who is here for evaluation of her sx. she reports episodes of significant shortness of breath and noisy breathing, sensation of being unable to pass air through her airway, which are typically triggered by things like drinking water or eating, but symptoms are not always triggered by food. She had speech therapy, and it was not helpful. She describes that when something hits her throat, even water, it sets off her sx. Sometimes it is food. She describes that her vocal cords slam shut and she cannot breath through her nose and her mouth. She has stridor when it occurs. She never passed out, but feels as if she will. She went to the ED for it, but never had intubation or other interventions. The attacks occurred every 2 weeks initially, but then became less frequent. She has heartburn occasionally. She is on Prilosec and takes it every night. She has ankylosing spondylitis. No trouble swallowing. No dyspnea unless she has an attack. She did not feel that voice therapy helps, initially had voice therapy 2/2 raspy voice at baseline. She is not bothered by her voice. Her main concern is her episodes of her throat closing off and trouble breathing. She was shown rescue breathing techniques in therapy sessions, and she is not sure if they help. Hx of OSA but she does not wear CPAP currently.   Records Reviewed:  Note by Rheumatology 04/03/23 Body mass index [BMI] 24.0-24.9, adult Z68.24  HLA B27 positive Z15.89  Encounter for long-term (current) use of high-risk medication Z79.899  Primary localized osteoarthrosis of multiple sites M19.91  Ankylosing spondylitis M45.9  Seeing [facility] for OSA hx of encounter 03/27/22 - advised to wear CPAP     Past Medical History:  Diagnosis Date    Anemia    Anxiety    Arthritis    Depression    Environmental and seasonal allergies    Heart murmur    during pregnancy   Hyperlipidemia    Hypertension    Insomnia    Pneumonia    hx of   PONV (postoperative nausea and vomiting)    Nausea, per pt. "felt loopy after hip surgery"   and made hallucinate    Past Surgical History:  Procedure Laterality Date   ABDOMINAL HYSTERECTOMY     COLONOSCOPY     HIP CLOSED REDUCTION Left 12/03/2021   Procedure: CLOSED REDUCTION HIP;  Surgeon: Jones Broom, MD;  Location: [facility];  Service: Orthopedics;  Laterality: Left;   TOTAL HIP ARTHROPLASTY Left 06/28/2014   Procedure: LEFT TOTAL HIP ARTHROPLASTY;  Surgeon: Nestor Lewandowsky, MD;  Location: [facility];  Service: Orthopedics;  Laterality: Left;   TOTAL HIP ARTHROPLASTY Right 04/27/2019   Procedure: Right Anterior Hip Arthroplasty;  Surgeon: Gean Birchwood, MD;  Location: [facility];  Service: Orthopedics;  Laterality: Right;   TOTAL KNEE ARTHROPLASTY Right 08/11/2014   Procedure: RIGHT TOTAL KNEE ARTHROPLASTY;  Surgeon: Nestor Lewandowsky, MD;  [facility]: [facility];  Service: Orthopedics;  Laterality: Right;   TRANSFORAMINAL LUMBAR INTERBODY FUSION (TLIF) WITH PEDICLE SCREW FIXATION 1 LEVEL Right 11/12/2018   Procedure: RIGHT-SIDED LUMBAR 4-5 TRANSFORAMINAL LUMBAR INTERBODY FUSION WITH INSTRUMENTATION AND ALLOGRAFT;  Surgeon: Estill Bamberg, MD;  [facility]: [facility];  Service: Orthopedics;  Laterality: Right;    Family History  Problem Relation Age  of Onset   Stroke Mother    Heart attack Mother    Coronary artery disease Father    Heart attack Brother    Heart attack Maternal Grandfather     Social History:  reports that she has never smoked. She has never used smokeless tobacco. She reports current alcohol use. She reports that she does not use drugs.  Allergies:  Allergies  Allergen Reactions   Nsaids Hives    Specifically meloxicam and ibuprofen reported   Onion Swelling    SWELLING REACTION UNSPECIFIED     Sulfa Antibiotics Swelling and Anaphylaxis    Patient reports tongue swells   Fluticasone Other (See Comments)    ulcers   Oxycodone Nausea Only    Severe nausea; pt has to take with Zofran   Tramadol Hives   Hydrocodone-Acetaminophen Other (See Comments)    Hallucinations   Ibuprofen     Other reaction(s): hives/itching   Meloxicam     Other reaction(s): pruritus   Rosuvastatin Calcium     Other reaction(s): cramps   Tizanidine Hcl     Other reaction(s): Hallucinations   Codeine Nausea And Vomiting    Medications: I have reviewed the patient's current medications.  The PMH, PSH, Medications, Allergies, and SH were reviewed and updated.  ROS: Constitutional: Negative for fever, weight loss and weight gain. Cardiovascular: Negative for chest pain and dyspnea on exertion. Respiratory: Is not experiencing shortness of breath at rest. Gastrointestinal: Negative for nausea and vomiting. Neurological: Negative for headaches. Psychiatric: The patient is not nervous/anxious  Blood pressure 136/81, pulse 63, height 5\' 4"  (1.626 m), weight 145 lb (65.8 kg), SpO2 98%.  PHYSICAL EXAM:  Exam: General: Well-developed, well-nourished Communication and Voice: Clear pitch and clarity Respiratory Respiratory effort: Equal inspiration and expiration without stridor Cardiovascular Peripheral Vascular: Warm extremities with equal color/perfusion Eyes: No nystagmus with equal extraocular motion bilaterally Neuro/Psych/Balance: Patient oriented to person, place, and time; Appropriate mood and affect; Gait is intact with no imbalance; Cranial nerves I-XII are intact Head and Face Inspection: Normocephalic and atraumatic without mass or lesion Palpation: Facial skeleton intact without bony stepoffs Salivary Glands: No mass or tenderness Facial Strength: Facial motility symmetric and full bilaterally ENT Pinna: External ear intact and fully developed External canal: Canal is patent with  intact skin Tympanic Membrane: Clear and mobile External Nose: No scar or anatomic deformity Internal Nose: Septum is deviated to the left. No polyp, or purulence. Mucosal edema and erythema present. Bilateral inferior turbinate hypertrophy.  Lips, Teeth, and gums: Mucosa and teeth intact and viable TMJ: No pain to palpation with full mobility Oral cavity/oropharynx: No erythema or exudate, no lesions present Nasopharynx: No mass or lesion with intact mucosa Hypopharynx: Intact mucosa without pooling of secretions Larynx Glottic: Full true vocal cord mobility without lesion or mass, mild VF edema Supraglottic: Normal appearing epiglottis and AE folds Interarytenoid Space: Moderate pachydermia edema Subglottic Space: Patent without lesion or edema Neck Neck and Trachea: Midline trachea without mass or lesion Thyroid: No mass or nodularity Lymphatics: No lymphadenopathy  Procedure: Findings: no consistent adduction with inspiration even with provoking maneuvers, b/l VF are mobile, no lesions   Preoperative diagnosis: upper airway evaluation hx of stridor and dyspnea with throat tightness   Postoperative diagnosis:   Same + GERD LPR  Procedure: Flexible fiberoptic laryngoscopy  Surgeon: Ashok Croon, MD  Anesthesia: Topical lidocaine and Afrin Complications: None Condition is stable throughout exam  Indications and consent:  The patient presents to the clinic with  Indirect laryngoscopy view was incomplete. Thus it was recommended that they undergo a flexible fiberoptic laryngoscopy. All of the risks, benefits, and potential complications were reviewed with the patient preoperatively and verbal informed consent was obtained.  Procedure: The patient was seated upright in the clinic. Topical lidocaine and Afrin were applied to the nasal cavity. After adequate anesthesia had occurred, I then proceeded to pass the flexible telescope into the nasal cavity. The nasal cavity was patent  without rhinorrhea or polyp. The nasopharynx was also patent without mass or lesion. The base of tongue was visualized and was normal. There were no signs of pooling of secretions in the piriform sinuses. The true vocal folds were mobile bilaterally. There were no signs of glottic or supraglottic mucosal lesion or mass. There was moderate interarytenoid pachydermia and post cricoid edema. The telescope was then slowly withdrawn and the patient tolerated the procedure throughout.  Studies Reviewed:HLA-B27 positive on Rheumatology testing   Assessment/Plan: Encounter Diagnoses  Name Primary?   Stridor Yes   Noisy breathing    Other form of dyspnea    Laryngospasm    Vocal cord dysfunction    Chronic GERD    Environmental and seasonal allergies    Post-nasal drip    Chronic nasal congestion     Recurrent episodes of throat tightness, difficulty with breathing and stridor lasting 1-2 minutes, near syncopal episodes, in the setting of something (food/liquid) hitting her throat, occur a few times a month, less frequent now. Ddx VCD vs laryngospasm, intermittent but not consistent adduction with inspiration on flexible scope exam today. Also has evidence of GERD/LPR and laryngospasm from GERD could be the cause of her sx.  - on Prilosec 40 mg daily - will continue for now and will add alginates  - diet and lifestyle changes to minimize reflux - she reports family hx of asthma, denies hx of lung disease, but never had PFTs during the workup of her sx - will refer to [facility] for PFTs and evaluation - we discussed benefits of speech therapy for VCD but she would like to hold off at this time (had a course of therapy a few years ago and it was not helpful) - we reviewed rescue breathing techniques   2. Environmental allergies and nasal congestion post-nasal drip  - start Azelastine 2 puffs b/l nares BID and take daily Zyrtec 10 mg  - will consider allergy testing in the future   3. GERD LPR - on  Prilosec 40 mg daily - will continue for now and will add alginates  - diet and lifestyle changes to minimize reflux  Thank you for allowing me to participate in the care of this patient. Please do not hesitate to contact me with any questions or concerns.   Ashok Croon, MD Otolaryngology [facility] Phone: [phone] Fax: [phone]    07/02/2023, 4:28 PM

## 2023-07-03 ENCOUNTER — Other Ambulatory Visit (HOSPITAL_COMMUNITY): Payer: Self-pay

## 2023-07-05 ENCOUNTER — Other Ambulatory Visit (HOSPITAL_COMMUNITY): Payer: Self-pay

## 2023-07-05 ENCOUNTER — Other Ambulatory Visit: Payer: Self-pay

## 2023-07-05 NOTE — Progress Notes (Signed)
Specialty Pharmacy Refill Coordination Note  Adrienne Campbell is a 62 y.o. female contacted today regarding refills of specialty medication(s) Adalimumab   Patient requested [name] at [facility] at [place] date: 07/09/23   Medication will be filled on 07/08/23.

## 2023-07-08 ENCOUNTER — Other Ambulatory Visit: Payer: Self-pay

## 2023-07-08 ENCOUNTER — Other Ambulatory Visit (HOSPITAL_COMMUNITY): Payer: Self-pay

## 2023-07-08 NOTE — Progress Notes (Signed)
Pharmacy Patient Advocate Encounter   Received notification from Patient Pharmacy that prior authorization for Humira is required/requested.   Insurance verification completed.   The patient is insured through [facility] .   Per test claim: PA required; PA submitted to above mentioned insurance via CoverMyMeds Key/confirmation #/EOC [facility] Status is pending

## 2023-07-09 ENCOUNTER — Other Ambulatory Visit: Payer: Self-pay

## 2023-07-09 NOTE — Progress Notes (Signed)
Pharmacy Patient Advocate Encounter  Received notification from [facility] that Prior Authorization for Humira has been APPROVED from 07/08/23 to 07/06/24   PA #/Case ID/Reference #: [id number]

## 2023-07-11 ENCOUNTER — Other Ambulatory Visit (HOSPITAL_COMMUNITY): Payer: Self-pay

## 2023-07-11 ENCOUNTER — Other Ambulatory Visit: Payer: Self-pay

## 2023-07-12 ENCOUNTER — Other Ambulatory Visit (HOSPITAL_COMMUNITY): Payer: Self-pay

## 2023-07-19 ENCOUNTER — Other Ambulatory Visit (HOSPITAL_COMMUNITY): Payer: Self-pay

## 2023-07-26 ENCOUNTER — Other Ambulatory Visit: Payer: Self-pay

## 2023-07-26 NOTE — Progress Notes (Signed)
Specialty Pharmacy Refill Coordination Note  Adrienne Campbell is a 63 y.o. female, Husband [name] was contacted today regarding refills of specialty medication(s) Adalimumab   Patient requested [name] at [facility] at [place] date: 08/06/23   Medication will be filled on 08/05/23.

## 2023-07-31 ENCOUNTER — Other Ambulatory Visit (HOSPITAL_COMMUNITY): Payer: Self-pay

## 2023-07-31 ENCOUNTER — Other Ambulatory Visit: Payer: Self-pay

## 2023-07-31 DIAGNOSIS — R300 Dysuria: Secondary | ICD-10-CM | POA: Diagnosis not present

## 2023-07-31 DIAGNOSIS — R3915 Urgency of urination: Secondary | ICD-10-CM | POA: Diagnosis not present

## 2023-07-31 DIAGNOSIS — N3020 Other chronic cystitis without hematuria: Secondary | ICD-10-CM | POA: Diagnosis not present

## 2023-07-31 MED ORDER — URIBEL 81.6 MG PO TABS
1.0000 | ORAL_TABLET | Freq: Four times a day (QID) | ORAL | 5 refills | Status: AC | PRN
  Filled 2023-07-31: qty 30, 8d supply, fill #0
  Filled 2023-12-16: qty 30, 8d supply, fill #1
  Filled 2024-05-14: qty 20, 5d supply, fill #2
  Filled 2024-05-14: qty 10, 3d supply, fill #2
  Filled 2024-05-29 (×2): qty 30, 8d supply, fill #3

## 2023-07-31 MED ORDER — DIAZEPAM 10 MG PO TABS
10.0000 mg | ORAL_TABLET | Freq: Two times a day (BID) | ORAL | 1 refills | Status: DC | PRN
  Filled 2023-07-31: qty 20, 10d supply, fill #0
  Filled 2023-12-22: qty 20, 10d supply, fill #1

## 2023-08-02 ENCOUNTER — Other Ambulatory Visit (HOSPITAL_COMMUNITY): Payer: Self-pay

## 2023-08-05 ENCOUNTER — Other Ambulatory Visit: Payer: Self-pay

## 2023-08-08 ENCOUNTER — Other Ambulatory Visit (HOSPITAL_COMMUNITY): Payer: Self-pay

## 2023-08-14 ENCOUNTER — Other Ambulatory Visit (HOSPITAL_COMMUNITY): Payer: Self-pay

## 2023-08-14 MED ORDER — LISINOPRIL-HYDROCHLOROTHIAZIDE 10-12.5 MG PO TABS
1.0000 | ORAL_TABLET | Freq: Every day | ORAL | 3 refills | Status: DC
  Filled 2023-08-14: qty 90, 90d supply, fill #0
  Filled 2023-11-12: qty 90, 90d supply, fill #1

## 2023-08-15 ENCOUNTER — Other Ambulatory Visit (HOSPITAL_COMMUNITY): Payer: Self-pay

## 2023-08-22 ENCOUNTER — Other Ambulatory Visit: Payer: Self-pay

## 2023-08-22 ENCOUNTER — Other Ambulatory Visit (HOSPITAL_COMMUNITY): Payer: Self-pay

## 2023-08-22 ENCOUNTER — Encounter (HOSPITAL_COMMUNITY): Payer: Self-pay

## 2023-08-22 ENCOUNTER — Other Ambulatory Visit: Payer: Self-pay | Admitting: Pharmacist

## 2023-08-22 MED ORDER — HUMIRA (2 SYRINGE) 40 MG/0.8ML ~~LOC~~ PSKT
PREFILLED_SYRINGE | SUBCUTANEOUS | 1 refills | Status: DC
  Filled 2023-08-23: qty 4, 28d supply, fill #0
  Filled 2023-09-27: qty 4, 28d supply, fill #1

## 2023-08-22 MED ORDER — HUMIRA (2 SYRINGE) 40 MG/0.8ML ~~LOC~~ PSKT: PREFILLED_SYRINGE | SUBCUTANEOUS | 1 refills | Status: DC

## 2023-08-22 NOTE — Progress Notes (Signed)
Specialty Pharmacy Refill Coordination Note  Adrienne Campbell is a 62 y.o. female contacted today regarding refills of specialty medication(s) Adalimumab (Humira (2 Syringe))   Patient requested Pickup at [facility] at [place] date: 09/05/23   Medication will be filled on 09/03/23. Pending Refill Request.

## 2023-08-22 NOTE — Progress Notes (Signed)
Specialty Pharmacy Ongoing Clinical Assessment Note  Adrienne Campbell is a 62 y.o. female who is being followed by the specialty pharmacy service for RxSp Ankylosing Spondylitis   Patient's specialty medication(s) reviewed today: Adalimumab (Humira (2 Syringe))   Missed doses in the last 4 weeks: 0   Patient/Caregiver did not have any additional questions or concerns.   Therapeutic benefit summary: Patient is achieving benefit   Adverse events/side effects summary: No adverse events/side effects   Patient's therapy is appropriate to: Continue    Goals Addressed             This Visit's Progress    Stabilization of disease       Patient is on track. Patient will maintain adherence         Follow up:  6 months  Bobette Mo Specialty Pharmacist

## 2023-08-23 ENCOUNTER — Other Ambulatory Visit: Payer: Self-pay

## 2023-09-03 ENCOUNTER — Other Ambulatory Visit: Payer: Self-pay

## 2023-09-06 ENCOUNTER — Other Ambulatory Visit (HOSPITAL_COMMUNITY): Payer: Self-pay

## 2023-09-12 ENCOUNTER — Other Ambulatory Visit (HOSPITAL_COMMUNITY): Payer: Self-pay

## 2023-09-12 MED ORDER — HYDROXYZINE PAMOATE 25 MG PO CAPS
25.0000 mg | ORAL_CAPSULE | Freq: Every day | ORAL | 3 refills | Status: DC
  Filled 2023-09-12: qty 90, 90d supply, fill #0
  Filled 2023-12-05: qty 90, 90d supply, fill #1
  Filled 2023-12-22 – 2023-12-25 (×2): qty 90, 90d supply, fill #2

## 2023-09-13 ENCOUNTER — Other Ambulatory Visit (HOSPITAL_COMMUNITY): Payer: Self-pay

## 2023-09-21 ENCOUNTER — Other Ambulatory Visit (HOSPITAL_COMMUNITY): Payer: Self-pay

## 2023-09-21 DIAGNOSIS — M5416 Radiculopathy, lumbar region: Secondary | ICD-10-CM | POA: Diagnosis not present

## 2023-09-21 DIAGNOSIS — M545 Low back pain, unspecified: Secondary | ICD-10-CM | POA: Diagnosis not present

## 2023-09-21 MED ORDER — METHYLPREDNISOLONE 4 MG PO TBPK
ORAL_TABLET | ORAL | 0 refills | Status: DC
  Filled 2023-09-21: qty 21, 6d supply, fill #0

## 2023-09-21 MED ORDER — METHOCARBAMOL 500 MG PO TABS
500.0000 mg | ORAL_TABLET | Freq: Three times a day (TID) | ORAL | 2 refills | Status: DC | PRN
  Filled 2023-09-21: qty 60, 8d supply, fill #0

## 2023-09-23 ENCOUNTER — Other Ambulatory Visit (HOSPITAL_COMMUNITY): Payer: Self-pay

## 2023-09-24 ENCOUNTER — Other Ambulatory Visit: Payer: Self-pay | Admitting: Orthopedic Surgery

## 2023-09-24 DIAGNOSIS — M545 Low back pain, unspecified: Secondary | ICD-10-CM

## 2023-09-25 ENCOUNTER — Other Ambulatory Visit: Payer: Self-pay | Admitting: Orthopedic Surgery

## 2023-09-25 DIAGNOSIS — M545 Low back pain, unspecified: Secondary | ICD-10-CM

## 2023-09-27 ENCOUNTER — Other Ambulatory Visit (HOSPITAL_COMMUNITY): Payer: Self-pay

## 2023-09-27 ENCOUNTER — Other Ambulatory Visit: Payer: Self-pay

## 2023-09-27 NOTE — Progress Notes (Signed)
Specialty Pharmacy Refill Coordination Note  Adrienne Campbell is a 63 y.o. female contacted today regarding refills of specialty medication(s) Adalimumab (Humira (2 Syringe))   Patient requested Pickup at [facility] at [place] date: 10/02/23   Medication will be filled on 01.28.25.

## 2023-10-01 ENCOUNTER — Other Ambulatory Visit: Payer: Self-pay

## 2023-10-02 DIAGNOSIS — L508 Other urticaria: Secondary | ICD-10-CM | POA: Diagnosis not present

## 2023-10-02 DIAGNOSIS — Z1589 Genetic susceptibility to other disease: Secondary | ICD-10-CM | POA: Diagnosis not present

## 2023-10-02 DIAGNOSIS — R220 Localized swelling, mass and lump, head: Secondary | ICD-10-CM | POA: Diagnosis not present

## 2023-10-02 DIAGNOSIS — Z79899 Other long term (current) drug therapy: Secondary | ICD-10-CM | POA: Diagnosis not present

## 2023-10-02 DIAGNOSIS — M459 Ankylosing spondylitis of unspecified sites in spine: Secondary | ICD-10-CM | POA: Diagnosis not present

## 2023-10-02 DIAGNOSIS — R5383 Other fatigue: Secondary | ICD-10-CM | POA: Diagnosis not present

## 2023-10-02 DIAGNOSIS — Z6824 Body mass index (BMI) 24.0-24.9, adult: Secondary | ICD-10-CM | POA: Diagnosis not present

## 2023-10-02 DIAGNOSIS — M1991 Primary osteoarthritis, unspecified site: Secondary | ICD-10-CM | POA: Diagnosis not present

## 2023-10-02 DIAGNOSIS — M545 Low back pain, unspecified: Secondary | ICD-10-CM | POA: Diagnosis not present

## 2023-10-02 DIAGNOSIS — R21 Rash and other nonspecific skin eruption: Secondary | ICD-10-CM | POA: Diagnosis not present

## 2023-10-04 ENCOUNTER — Other Ambulatory Visit (HOSPITAL_COMMUNITY): Payer: Self-pay

## 2023-10-07 ENCOUNTER — Encounter: Payer: Self-pay | Admitting: Orthopedic Surgery

## 2023-10-14 ENCOUNTER — Other Ambulatory Visit (HOSPITAL_COMMUNITY): Payer: Self-pay

## 2023-10-14 MED ORDER — CYCLOBENZAPRINE HCL 10 MG PO TABS
10.0000 mg | ORAL_TABLET | Freq: Every day | ORAL | 3 refills | Status: DC
  Filled 2023-10-14: qty 30, 30d supply, fill #0
  Filled 2023-11-12: qty 30, 30d supply, fill #1
  Filled 2023-12-05: qty 30, 30d supply, fill #2
  Filled 2023-12-22 – 2024-01-06 (×3): qty 30, 30d supply, fill #3

## 2023-10-14 MED ORDER — CELECOXIB 200 MG PO CAPS
200.0000 mg | ORAL_CAPSULE | Freq: Two times a day (BID) | ORAL | 1 refills | Status: DC
  Filled 2023-10-14: qty 180, 90d supply, fill #0
  Filled 2023-12-16: qty 180, 90d supply, fill #1

## 2023-10-15 ENCOUNTER — Ambulatory Visit
Admission: RE | Admit: 2023-10-15 | Discharge: 2023-10-15 | Disposition: A | Discharge status: Home / Self Care | Payer: 59 | Source: Ambulatory Visit | Attending: Orthopedic Surgery | Admitting: Orthopedic Surgery

## 2023-10-15 ENCOUNTER — Ambulatory Visit
Admission: RE | Admit: 2023-10-15 | Discharge: 2023-10-15 | Disposition: A | Discharge status: Home / Self Care | Payer: Commercial Managed Care - PPO | Source: Ambulatory Visit | Attending: Orthopedic Surgery | Admitting: Orthopedic Surgery

## 2023-10-15 DIAGNOSIS — M545 Low back pain, unspecified: Secondary | ICD-10-CM | POA: Diagnosis not present

## 2023-10-15 DIAGNOSIS — Z981 Arthrodesis status: Secondary | ICD-10-CM | POA: Diagnosis not present

## 2023-10-15 DIAGNOSIS — M47816 Spondylosis without myelopathy or radiculopathy, lumbar region: Secondary | ICD-10-CM | POA: Diagnosis not present

## 2023-10-15 DIAGNOSIS — M48061 Spinal stenosis, lumbar region without neurogenic claudication: Secondary | ICD-10-CM | POA: Diagnosis not present

## 2023-10-18 DIAGNOSIS — M545 Low back pain, unspecified: Secondary | ICD-10-CM | POA: Diagnosis not present

## 2023-10-28 DIAGNOSIS — N3020 Other chronic cystitis without hematuria: Secondary | ICD-10-CM | POA: Diagnosis not present

## 2023-10-28 DIAGNOSIS — L258 Unspecified contact dermatitis due to other agents: Secondary | ICD-10-CM | POA: Diagnosis not present

## 2023-10-28 DIAGNOSIS — R300 Dysuria: Secondary | ICD-10-CM | POA: Diagnosis not present

## 2023-10-28 DIAGNOSIS — N952 Postmenopausal atrophic vaginitis: Secondary | ICD-10-CM | POA: Diagnosis not present

## 2023-10-28 DIAGNOSIS — R3982 Chronic bladder pain: Secondary | ICD-10-CM | POA: Diagnosis not present

## 2023-10-31 ENCOUNTER — Other Ambulatory Visit: Payer: Self-pay | Admitting: Pharmacist

## 2023-10-31 ENCOUNTER — Other Ambulatory Visit (HOSPITAL_COMMUNITY): Payer: Self-pay

## 2023-10-31 ENCOUNTER — Other Ambulatory Visit: Payer: Self-pay

## 2023-10-31 MED ORDER — HUMIRA (2 SYRINGE) 40 MG/0.8ML ~~LOC~~ PSKT: PREFILLED_SYRINGE | SUBCUTANEOUS | 5 refills | Status: DC

## 2023-10-31 MED ORDER — HUMIRA (2 SYRINGE) 40 MG/0.8ML ~~LOC~~ PSKT
PREFILLED_SYRINGE | SUBCUTANEOUS | 5 refills | Status: DC
  Filled 2023-10-31: qty 4, 28d supply, fill #0
  Filled 2023-11-25: qty 4, 28d supply, fill #1
  Filled 2023-12-16: qty 4, 28d supply, fill #2
  Filled 2024-01-16: qty 4, 28d supply, fill #3
  Filled 2024-02-26 – 2024-03-19 (×3): qty 4, 28d supply, fill #4
  Filled 2024-04-10 – 2024-04-20 (×2): qty 4, 28d supply, fill #5

## 2023-10-31 NOTE — Progress Notes (Signed)
 Specialty Pharmacy Refill Coordination Note  Adrienne Campbell is a 63 y.o. female contacted today regarding refills of specialty medication(s) Adalimumab (Humira (2 Syringe))   Patient requested Pickup at [facility] at [place] date: 11/05/23   Medication will be filled on 11/04/23. This fill date is pending response to refill request from provider. Patient is aware and if they have not received fill by intended date they must follow up with pharmacy.

## 2023-11-01 DIAGNOSIS — L258 Unspecified contact dermatitis due to other agents: Secondary | ICD-10-CM | POA: Diagnosis not present

## 2023-11-01 DIAGNOSIS — M47816 Spondylosis without myelopathy or radiculopathy, lumbar region: Secondary | ICD-10-CM | POA: Diagnosis not present

## 2023-11-02 ENCOUNTER — Other Ambulatory Visit (HOSPITAL_COMMUNITY): Payer: Self-pay

## 2023-11-04 ENCOUNTER — Other Ambulatory Visit: Payer: Self-pay

## 2023-11-04 ENCOUNTER — Other Ambulatory Visit (HOSPITAL_COMMUNITY): Payer: Self-pay

## 2023-11-04 MED ORDER — OMEPRAZOLE 40 MG PO CPDR
DELAYED_RELEASE_CAPSULE | ORAL | 1 refills | Status: DC
  Filled 2023-11-04: qty 90, 90d supply, fill #0
  Filled 2024-02-18: qty 90, 90d supply, fill #1

## 2023-11-05 DIAGNOSIS — N390 Urinary tract infection, site not specified: Secondary | ICD-10-CM | POA: Diagnosis not present

## 2023-11-05 DIAGNOSIS — B952 Enterococcus as the cause of diseases classified elsewhere: Secondary | ICD-10-CM | POA: Diagnosis not present

## 2023-11-05 DIAGNOSIS — N3582 Other urethral stricture, female: Secondary | ICD-10-CM | POA: Diagnosis not present

## 2023-11-06 ENCOUNTER — Other Ambulatory Visit (HOSPITAL_COMMUNITY): Payer: Self-pay

## 2023-11-11 ENCOUNTER — Other Ambulatory Visit (HOSPITAL_COMMUNITY): Payer: Self-pay

## 2023-11-11 MED ORDER — NITROFURANTOIN MONOHYD MACRO 100 MG PO CAPS
100.0000 mg | ORAL_CAPSULE | Freq: Two times a day (BID) | ORAL | 0 refills | Status: DC
  Filled 2023-11-11: qty 14, 7d supply, fill #0

## 2023-11-13 ENCOUNTER — Other Ambulatory Visit (HOSPITAL_COMMUNITY): Payer: Self-pay | Admitting: Family Medicine

## 2023-11-13 ENCOUNTER — Encounter (HOSPITAL_COMMUNITY): Payer: Self-pay | Admitting: Family Medicine

## 2023-11-13 DIAGNOSIS — M549 Dorsalgia, unspecified: Secondary | ICD-10-CM

## 2023-11-13 DIAGNOSIS — R200 Anesthesia of skin: Secondary | ICD-10-CM | POA: Diagnosis not present

## 2023-11-13 DIAGNOSIS — R52 Pain, unspecified: Secondary | ICD-10-CM

## 2023-11-13 DIAGNOSIS — M5412 Radiculopathy, cervical region: Secondary | ICD-10-CM

## 2023-11-13 DIAGNOSIS — M792 Neuralgia and neuritis, unspecified: Secondary | ICD-10-CM

## 2023-11-13 DIAGNOSIS — M546 Pain in thoracic spine: Secondary | ICD-10-CM | POA: Diagnosis not present

## 2023-11-13 DIAGNOSIS — R202 Paresthesia of skin: Secondary | ICD-10-CM | POA: Diagnosis not present

## 2023-11-16 ENCOUNTER — Ambulatory Visit (HOSPITAL_COMMUNITY)
Admission: RE | Admit: 2023-11-16 | Discharge: 2023-11-16 | Disposition: A | Discharge status: Home / Self Care | Source: Ambulatory Visit | Attending: Family Medicine | Admitting: Family Medicine

## 2023-11-16 DIAGNOSIS — M4804 Spinal stenosis, thoracic region: Secondary | ICD-10-CM | POA: Diagnosis not present

## 2023-11-16 DIAGNOSIS — R200 Anesthesia of skin: Secondary | ICD-10-CM | POA: Diagnosis not present

## 2023-11-16 DIAGNOSIS — M5114 Intervertebral disc disorders with radiculopathy, thoracic region: Secondary | ICD-10-CM | POA: Diagnosis not present

## 2023-11-16 DIAGNOSIS — M792 Neuralgia and neuritis, unspecified: Secondary | ICD-10-CM | POA: Diagnosis not present

## 2023-11-16 DIAGNOSIS — M549 Dorsalgia, unspecified: Secondary | ICD-10-CM | POA: Diagnosis not present

## 2023-11-16 DIAGNOSIS — M4802 Spinal stenosis, cervical region: Secondary | ICD-10-CM | POA: Diagnosis not present

## 2023-11-16 DIAGNOSIS — M5412 Radiculopathy, cervical region: Secondary | ICD-10-CM | POA: Insufficient documentation

## 2023-11-16 DIAGNOSIS — M50122 Cervical disc disorder at C5-C6 level with radiculopathy: Secondary | ICD-10-CM | POA: Diagnosis not present

## 2023-11-19 ENCOUNTER — Other Ambulatory Visit (HOSPITAL_COMMUNITY): Payer: Self-pay

## 2023-11-20 ENCOUNTER — Other Ambulatory Visit (HOSPITAL_COMMUNITY)

## 2023-11-20 DIAGNOSIS — M47816 Spondylosis without myelopathy or radiculopathy, lumbar region: Secondary | ICD-10-CM | POA: Diagnosis not present

## 2023-11-21 ENCOUNTER — Other Ambulatory Visit: Payer: Self-pay

## 2023-11-22 ENCOUNTER — Other Ambulatory Visit (HOSPITAL_COMMUNITY): Payer: Self-pay

## 2023-11-25 ENCOUNTER — Other Ambulatory Visit (HOSPITAL_COMMUNITY): Payer: Self-pay

## 2023-11-25 ENCOUNTER — Other Ambulatory Visit: Payer: Self-pay

## 2023-11-25 NOTE — Progress Notes (Signed)
 Specialty Pharmacy Refill Coordination Note  Adrienne Campbell is a 63 y.o. female contacted today regarding refills of specialty medication(s) Adalimumab (Humira (2 Syringe))   Patient requested Pickup at [facility] at [place] date: 11/29/23   Medication will be filled on 11/28/23.

## 2023-11-27 ENCOUNTER — Other Ambulatory Visit (HOSPITAL_COMMUNITY): Payer: Self-pay

## 2023-11-28 ENCOUNTER — Other Ambulatory Visit: Payer: Self-pay

## 2023-12-02 DIAGNOSIS — M5412 Radiculopathy, cervical region: Secondary | ICD-10-CM | POA: Diagnosis not present

## 2023-12-03 DIAGNOSIS — N3582 Other urethral stricture, female: Secondary | ICD-10-CM | POA: Diagnosis not present

## 2023-12-03 DIAGNOSIS — N3020 Other chronic cystitis without hematuria: Secondary | ICD-10-CM | POA: Diagnosis not present

## 2023-12-06 ENCOUNTER — Other Ambulatory Visit: Payer: Self-pay

## 2023-12-06 ENCOUNTER — Other Ambulatory Visit (HOSPITAL_COMMUNITY): Payer: Self-pay

## 2023-12-12 ENCOUNTER — Other Ambulatory Visit: Payer: Self-pay | Admitting: Orthopedic Surgery

## 2023-12-13 DIAGNOSIS — M47816 Spondylosis without myelopathy or radiculopathy, lumbar region: Secondary | ICD-10-CM | POA: Diagnosis not present

## 2023-12-16 ENCOUNTER — Other Ambulatory Visit: Payer: Self-pay

## 2023-12-16 ENCOUNTER — Other Ambulatory Visit (HOSPITAL_COMMUNITY): Payer: Self-pay

## 2023-12-16 NOTE — Progress Notes (Signed)
 Specialty Pharmacy Refill Coordination Note  Adrienne Campbell is a 63 y.o. female contacted today regarding refills of specialty medication(s) Adalimumab (Humira (2 Syringe))   Patient requested Pickup at [facility] at [place] date: 12/20/23   Medication will be filled on 04.17.25.

## 2023-12-17 ENCOUNTER — Other Ambulatory Visit (HOSPITAL_COMMUNITY): Payer: Self-pay

## 2023-12-17 MED ORDER — VENLAFAXINE HCL ER 150 MG PO CP24
150.0000 mg | ORAL_CAPSULE | Freq: Every day | ORAL | 3 refills | Status: DC
  Filled 2023-12-17: qty 90, 90d supply, fill #0

## 2023-12-18 ENCOUNTER — Other Ambulatory Visit (HOSPITAL_COMMUNITY): Payer: Self-pay

## 2023-12-18 NOTE — Progress Notes (Signed)
 Patient's husband called back and reports to have 2 pens remaining. Patient would prefer to pick up next supply on Friday 12/27/23.

## 2023-12-23 ENCOUNTER — Other Ambulatory Visit: Payer: Self-pay

## 2023-12-25 ENCOUNTER — Other Ambulatory Visit (HOSPITAL_COMMUNITY): Payer: Self-pay

## 2023-12-26 ENCOUNTER — Other Ambulatory Visit: Payer: Self-pay

## 2023-12-26 ENCOUNTER — Other Ambulatory Visit (HOSPITAL_COMMUNITY): Payer: Self-pay

## 2023-12-26 DIAGNOSIS — I10 Essential (primary) hypertension: Secondary | ICD-10-CM | POA: Diagnosis not present

## 2023-12-26 DIAGNOSIS — E7800 Pure hypercholesterolemia, unspecified: Secondary | ICD-10-CM | POA: Diagnosis not present

## 2023-12-26 DIAGNOSIS — Z0000 Encounter for general adult medical examination without abnormal findings: Secondary | ICD-10-CM | POA: Diagnosis not present

## 2023-12-26 DIAGNOSIS — F321 Major depressive disorder, single episode, moderate: Secondary | ICD-10-CM | POA: Diagnosis not present

## 2023-12-26 DIAGNOSIS — E0590 Thyrotoxicosis, unspecified without thyrotoxic crisis or storm: Secondary | ICD-10-CM | POA: Diagnosis not present

## 2023-12-26 MED ORDER — VENLAFAXINE HCL ER 150 MG PO CP24
150.0000 mg | ORAL_CAPSULE | Freq: Every day | ORAL | 3 refills | Status: AC
  Filled 2024-03-19: qty 90, 90d supply, fill #0
  Filled 2024-06-15: qty 90, 90d supply, fill #1
  Filled 2024-09-15: qty 90, 90d supply, fill #2

## 2023-12-26 MED ORDER — ACYCLOVIR 200 MG PO CAPS
400.0000 mg | ORAL_CAPSULE | Freq: Three times a day (TID) | ORAL | 1 refills | Status: AC
  Filled 2023-12-26: qty 360, 60d supply, fill #0
  Filled 2024-03-19: qty 360, 60d supply, fill #1

## 2023-12-26 MED ORDER — HYDROXYZINE PAMOATE 25 MG PO CAPS
25.0000 mg | ORAL_CAPSULE | Freq: Every day | ORAL | 3 refills | Status: AC
  Filled 2024-02-08 – 2024-02-18 (×2): qty 90, 90d supply, fill #0
  Filled 2024-04-21 – 2024-05-18 (×2): qty 90, 90d supply, fill #1
  Filled 2024-08-03: qty 90, 90d supply, fill #2

## 2023-12-26 MED ORDER — LISINOPRIL-HYDROCHLOROTHIAZIDE 10-12.5 MG PO TABS
1.0000 | ORAL_TABLET | Freq: Every day | ORAL | 3 refills | Status: AC
  Filled 2024-02-08: qty 90, 90d supply, fill #0
  Filled 2024-05-20: qty 90, 90d supply, fill #1
  Filled 2024-08-17: qty 90, 90d supply, fill #2

## 2023-12-27 ENCOUNTER — Other Ambulatory Visit: Payer: Self-pay

## 2023-12-27 ENCOUNTER — Other Ambulatory Visit (HOSPITAL_COMMUNITY): Payer: Self-pay

## 2024-01-03 ENCOUNTER — Other Ambulatory Visit (HOSPITAL_COMMUNITY): Payer: Self-pay

## 2024-01-06 ENCOUNTER — Other Ambulatory Visit (HOSPITAL_COMMUNITY): Payer: Self-pay

## 2024-01-06 NOTE — Pre-Procedure Instructions (Signed)
 Surgical Instructions   Your procedure is scheduled on Jan 16, 2024. Report to [facility] Main Entrance "A" at 12:50 P.M., then check in with the Admitting office. Any questions or running late day of surgery: call [phone]  Questions prior to your surgery date: call [phone], Monday-Friday, 8am-4pm. If you experience any cold or flu symptoms such as cough, fever, chills, shortness of breath, etc. between now and your scheduled surgery, please notify us  at the above number.     Remember:  Do not eat after midnight the night before your surgery  You may drink clear liquids until 12:50 PM the afternoon of your surgery.   Clear liquids allowed are: Water , Non-Citrus Juices (without pulp), Carbonated Beverages, Clear Tea (no milk, honey, etc.), Black Coffee Only (NO MILK, CREAM OR POWDERED CREAMER of any kind), and Gatorade.  Patient Instructions  The night before surgery:  No food after midnight. ONLY clear liquids after midnight  The day of surgery (if you do NOT have diabetes):  Drink ONE (1) Pre-Surgery Clear Ensure by 12:50 PM the afternoon of surgery. Drink in one sitting. Do not sip.  This drink was given to you during your hospital  pre-op appointment visit.  Nothing else to drink after completing the  Pre-Surgery Clear Ensure.         If you have questions, please contact your surgeon's office.   Take these medicines the morning of surgery with A SIP OF WATER : azelastine  (ASTELIN ) nasal spray  cyclobenzaprine  (FLEXERIL )  ezetimibe  (ZETIA )  omeprazole  (PRILOSEC)  venlafaxine  XR (EFFEXOR -XR)    May take these medicines IF NEEDED: acetaminophen  (TYLENOL )  acyclovir  (ZOVIRAX )  diazepam  (VALIUM ) Polyethyl Glycol-Propyl Glycol eye drops   Please follow your prescribing physician's instructions on if/when to stop taking your adalimumab  (HUMIRA ).   One week prior to surgery, STOP taking any Aspirin  (unless otherwise instructed by your surgeon) Aleve, Naproxen,  Ibuprofen, Motrin, Advil, Goody's, BC's, all herbal medications, fish oil, and non-prescription vitamins. This includes your medication: celecoxib  (CELEBREX )                      Do NOT Smoke (Tobacco/Vaping) for 24 hours prior to your procedure.  If you use a CPAP at night, you may bring your mask/headgear for your overnight stay.   You will be asked to remove any contacts, glasses, piercing's, hearing aid's, dentures/partials prior to surgery. Please bring cases for these items if needed.    Patients discharged the day of surgery will not be allowed to drive home, and someone needs to stay with them for 24 hours.  SURGICAL WAITING ROOM VISITATION Patients may have no more than 2 support people in the waiting area - these visitors may rotate.   Pre-op nurse will coordinate an appropriate time for 1 ADULT support person, who may not rotate, to accompany patient in pre-op.  Children under the age of 30 must have an adult with them who is not the patient and must remain in the main waiting area with an adult.  If the patient needs to stay at the hospital during part of their recovery, the visitor guidelines for inpatient rooms apply.  Please refer to the [facility] website for the visitor guidelines for any additional information.   If you received a COVID test during your pre-op visit  it is requested that you wear a mask when out in public, stay away from anyone that may not be feeling well and notify your surgeon if you develop  symptoms. If you have been in contact with anyone that has tested positive in the last 10 days please notify you surgeon.      Pre-operative 5 CHG Bathing Instructions   You can play a key role in reducing the risk of infection after surgery. Your skin needs to be as free of germs as possible. You can reduce the number of germs on your skin by washing with CHG (chlorhexidine  gluconate) soap before surgery. CHG is an antiseptic soap that kills germs and continues  to kill germs even after washing.   DO NOT use if you have an allergy to chlorhexidine /CHG or antibacterial soaps. If your skin becomes reddened or irritated, stop using the CHG and notify one of our RNs at [phone].   Please shower with the CHG soap starting 4 days before surgery using the following schedule:     Please keep in mind the following:  DO NOT shave, including legs and underarms, starting the day of your first shower.   You may shave your face at any point before/day of surgery.  Place clean sheets on your bed the day you start using CHG soap. Use a clean washcloth (not used since being washed) for each shower. DO NOT sleep with pets once you start using the CHG.   CHG Shower Instructions:  Wash your face and private area with normal soap. If you choose to wash your hair, wash first with your normal shampoo.  After you use shampoo/soap, rinse your hair and body thoroughly to remove shampoo/soap residue.  Turn the water  OFF and apply about 3 tablespoons (45 ml) of CHG soap to a CLEAN washcloth.  Apply CHG soap ONLY FROM YOUR NECK DOWN TO YOUR TOES (washing for 3-5 minutes)  DO NOT use CHG soap on face, private areas, open wounds, or sores.  Pay special attention to the area where your surgery is being performed.  If you are having back surgery, having someone wash your back for you may be helpful. Wait 2 minutes after CHG soap is applied, then you may rinse off the CHG soap.  Pat dry with a clean towel  Put on clean clothes/pajamas   If you choose to wear lotion, please use ONLY the CHG-compatible lotions that are listed below.  Additional instructions for the day of surgery: DO NOT APPLY any lotions, deodorants, cologne, or perfumes.   Do not bring valuables to the hospital. [facility] is not responsible for any belongings/valuables. Do not wear nail polish, gel polish, artificial nails, or any other type of covering on natural nails (fingers and toes) Do not wear  jewelry or makeup Put on clean/comfortable clothes.  Please brush your teeth.  Ask your nurse before applying any prescription medications to the skin.     CHG Compatible Lotions   Aveeno Moisturizing lotion  Cetaphil Moisturizing Cream  Cetaphil Moisturizing Lotion  Clairol Herbal Essence Moisturizing Lotion, Dry Skin  Clairol Herbal Essence Moisturizing Lotion, Extra Dry Skin  Clairol Herbal Essence Moisturizing Lotion, Normal Skin  Curel Age Defying Therapeutic Moisturizing Lotion with Alpha Hydroxy  Curel Extreme Care Body Lotion  Curel Soothing Hands Moisturizing Hand Lotion  Curel Therapeutic Moisturizing Cream, Fragrance-Free  Curel Therapeutic Moisturizing Lotion, Fragrance-Free  Curel Therapeutic Moisturizing Lotion, Original Formula  Eucerin Daily Replenishing Lotion  Eucerin Dry Skin Therapy Plus Alpha Hydroxy Crme  Eucerin Dry Skin Therapy Plus Alpha Hydroxy Lotion  Eucerin Original Crme  Eucerin Original Lotion  Eucerin Plus Crme Eucerin Plus Lotion  Eucerin TriLipid  Replenishing Lotion  Keri Anti-Bacterial Hand Lotion  Keri Deep Conditioning Original Lotion Dry Skin Formula Softly Scented  Keri Deep Conditioning Original Lotion, Fragrance Free Sensitive Skin Formula  Keri Lotion Fast Absorbing Fragrance Free Sensitive Skin Formula  Keri Lotion Fast Absorbing Softly Scented Dry Skin Formula  Keri Original Lotion  Keri Skin Renewal Lotion Keri Silky Smooth Lotion  Keri Silky Smooth Sensitive Skin Lotion  Nivea Body Creamy Conditioning Oil  Nivea Body Extra Enriched Lotion  Nivea Body Original Lotion  Nivea Body Sheer Moisturizing Lotion Nivea Crme  Nivea Skin Firming Lotion  NutraDerm 30 Skin Lotion  NutraDerm Skin Lotion  NutraDerm Therapeutic Skin Cream  NutraDerm Therapeutic Skin Lotion  ProShield Protective Hand Cream  Provon moisturizing lotion  Please read over the following fact sheets that you were given.

## 2024-01-07 ENCOUNTER — Encounter (HOSPITAL_COMMUNITY)
Admission: RE | Admit: 2024-01-07 | Discharge: 2024-01-07 | Disposition: A | Discharge status: Home / Self Care | Source: Ambulatory Visit | Attending: Orthopedic Surgery | Admitting: Orthopedic Surgery

## 2024-01-07 ENCOUNTER — Encounter (HOSPITAL_COMMUNITY): Payer: Self-pay

## 2024-01-07 ENCOUNTER — Other Ambulatory Visit: Payer: Self-pay | Admitting: *Deleted

## 2024-01-07 ENCOUNTER — Other Ambulatory Visit: Payer: Self-pay

## 2024-01-07 VITALS — BP 126/75 | HR 87 | Temp 98.3°F | Resp 17 | Ht 63.0 in | Wt 146.8 lb

## 2024-01-07 DIAGNOSIS — Z01818 Encounter for other preprocedural examination: Secondary | ICD-10-CM | POA: Insufficient documentation

## 2024-01-07 DIAGNOSIS — I2510 Atherosclerotic heart disease of native coronary artery without angina pectoris: Secondary | ICD-10-CM | POA: Diagnosis not present

## 2024-01-07 DIAGNOSIS — E7849 Other hyperlipidemia: Secondary | ICD-10-CM

## 2024-01-07 HISTORY — DX: Ankylosing spondylitis of unspecified sites in spine: M45.9

## 2024-01-07 HISTORY — DX: Sleep apnea, unspecified: G47.30

## 2024-01-07 HISTORY — DX: Gastro-esophageal reflux disease without esophagitis: K21.9

## 2024-01-07 LAB — BASIC METABOLIC PANEL WITH GFR
Anion gap: 9 (ref 5–15)
BUN: 17 mg/dL (ref 8–23)
CO2: 25 mmol/L (ref 22–32)
Calcium: 9.2 mg/dL (ref 8.9–10.3)
Chloride: 99 mmol/L (ref 98–111)
Creatinine, Ser: 0.63 mg/dL (ref 0.44–1.00)
GFR, Estimated: >60 mL/min (ref >60)
Glucose, Bld: 88 mg/dL (ref 70–99)
Potassium: 4.4 mmol/L (ref 3.5–5.1)
Sodium: 133 mmol/L — ABNORMAL LOW (ref 135–145)

## 2024-01-07 LAB — SURGICAL PCR SCREEN
MRSA, PCR: NEGATIVE
Staphylococcus aureus: NEGATIVE

## 2024-01-07 LAB — CBC
HCT: 37.7 % (ref 36.0–46.0)
Hemoglobin: 12.6 g/dL (ref 12.0–15.0)
MCH: 33.5 pg (ref 26.0–34.0)
MCHC: 33.4 g/dL (ref 30.0–36.0)
MCV: 100.3 fL — ABNORMAL HIGH (ref 80.0–100.0)
Platelets: 390 10*3/uL (ref 150–400)
RBC: 3.76 MIL/uL — ABNORMAL LOW (ref 3.87–5.11)
RDW: 11.8 % (ref 11.5–15.5)
WBC: 10.8 10*3/uL — ABNORMAL HIGH (ref 4.0–10.5)
nRBC: 0 % (ref 0.0–0.2)

## 2024-01-07 NOTE — Progress Notes (Signed)
 PCP - Dr. Maybell Spates [place] Cardiologist - Dr. Dinah Franco - last office visit 03/08/2023  PPM/ICD - Denies Device Orders - n/a Rep Notified - n/a  Chest x-ray - n/a EKG - 01/07/2024 Stress Test - Denies ECHO - Denies Cardiac Cath - Denies CT Coronary - 11/29/2021  Sleep Study - +OSA but pt unable to tolerate CPAP CPAP - n/a  No DM  Last dose of GLP1 agonist- n/a GLP1 instructions: n/a  Blood Thinner Instructions: n/a Aspirin  Instructions: n/a  ERAS Protcol - Clear liquids until 0750 morning of surgery PRE-SURGERY Ensure or G2- Ensure given to pt with instructions  COVID TEST- n/a   Anesthesia review: Yes. Borderline EKG review. Per pt, Dr. Maximo Spar is supposed to be sending Cardiac Clearance to surgeon's office. She was instructed to hold Celebrex  for one week. She did not receive any instructions in regards to her Humira  injections, which she takes every Sunday. Pt instructed to reach out to prescribing physician for these instructions.   Patient denies shortness of breath, fever, cough and chest pain at PAT appointment. Pt denies any respiratory illness/infection in the last two months.    All instructions explained to the patient, with a verbal understanding of the material. Patient agrees to go over the instructions while at home for a better understanding. Patient also instructed to self quarantine after being tested for COVID-19. The opportunity to ask questions was provided.

## 2024-01-08 ENCOUNTER — Encounter (HOSPITAL_COMMUNITY): Payer: Self-pay

## 2024-01-08 ENCOUNTER — Telehealth: Payer: Self-pay

## 2024-01-08 NOTE — Anesthesia Preprocedure Evaluation (Addendum)
 Anesthesia Evaluation  Patient identified by MRN, date of birth, ID band Patient awake    Reviewed: Allergy & Precautions, NPO status , Patient's Chart, lab work & pertinent test results  History of Anesthesia Complications (+) PONV and history of anesthetic complications  Airway Mallampati: IV  TM Distance: >3 FB Neck ROM: Full  Mouth opening: Limited Mouth Opening  Dental no notable dental hx. (+) Teeth Intact, Dental Advisory Given   Pulmonary sleep apnea    Pulmonary exam normal breath sounds clear to auscultation       Cardiovascular hypertension, Pt. on medications Normal cardiovascular exam Rhythm:Regular Rate:Normal     Neuro/Psych  PSYCHIATRIC DISORDERS  Depression    negative neurological ROS     GI/Hepatic Neg liver ROS,GERD  ,,  Endo/Other  negative endocrine ROS    Renal/GU negative Renal ROS  negative genitourinary   Musculoskeletal  (+) Arthritis ,    Abdominal   Peds  Hematology negative hematology ROS (+)   Anesthesia Other Findings Anklyosing spondylitis  History includes never smoker, post-operative N/V, HLD, HTN, elevated coronary calcium  score (78, 86th percentile 11/2021), OSA (intolerant to CPAP), murmur in pregnancy, GERD, hysterectomy (09/11/05), arthritis (left THA 06/28/14; s/p closed reduction for dislocation 12/03/21; right TKA 08/11/14; right THA 04/27/19), spinal surgery (L4-5 fusion 11/12/18), ankylosing spondylitis w/ HLA-B27 positive.     Reproductive/Obstetrics                             Anesthesia Physical Anesthesia Plan  ASA: 3  Anesthesia Plan: General   Post-op Pain Management: Tylenol  PO (pre-op)*   Induction: Intravenous  PONV Risk Score and Plan: 4 or greater and Midazolam , Dexamethasone , Ondansetron , Treatment may vary due to age or medical condition and TIVA  Airway Management Planned: Oral ETT and Video Laryngoscope Planned  Additional  Equipment:   Intra-op Plan:   Post-operative Plan: Extubation in OR  Informed Consent: I have reviewed the patients History and Physical, chart, labs and discussed the procedure including the risks, benefits and alternatives for the proposed anesthesia with the patient or authorized representative who has indicated his/her understanding and acceptance.     Dental advisory given  Plan Discussed with: CRNA  Anesthesia Plan Comments: (PAT note written by Mancil Pfenning, PA-C. Saw ENT 06/2023 for history of laryngospasm, possibly due to GERD/LPR.   )       Anesthesia Quick Evaluation

## 2024-01-08 NOTE — Telephone Encounter (Signed)
   Pre-operative Risk Assessment    Patient Name: Adrienne Campbell  DOB: 14-Feb-1961 MRN: [id number]   Date of last office visit: 03/08/23 Dinah Franco, MD Date of next office visit: 03/17/24 Dinah Franco, MD   Request for Surgical Clearance    Procedure:   ANTERIOR CERVICAL DECOMPRESSION FUSION CERVICAL 5-6 CERVICAL 6-7 WITH INSTRUMENTATION AND ALLOGRAFT  Date of Surgery:  Clearance 01/16/24                                Surgeon:  Virl Grimes, MD Surgeon's Group or Practice Name:  [facility] Phone number:  [phone] Fax number:  [phone]   ATTN: DONNA MCCAIN SURGICAL COORDINATOR   Type of Clearance Requested:   - Medical    Type of Anesthesia:  Not Indicated   Additional requests/questions:    Signed, Collin Deal   01/08/2024, 3:14 PM

## 2024-01-08 NOTE — Progress Notes (Addendum)
 Anesthesia Chart Review:  Case: [id number] Date/Time: 01/16/24 1038   Procedure: ANTERIOR CERVICAL DECOMPRESSION/DISCECTOMY FUSION 2 LEVELS - ANTERIOR CERVICAL DECOMPRESSION FUSION CERVICAL 5- CERVICAL 6, CERVICAL 6- CERVICAL 7 WITH INSTRUMENTATION AND ALLOGRAFT   Anesthesia type: General   Diagnosis: Cervical radiculopathy [M54.12]   Pre-op diagnosis: CERVICAL RADICULOPATHY   [facility]   Surgeons: Virl Grimes, MD       DISCUSSION: Patient is a 63 year old female scheduled for the above procedure.  History includes never smoker, post-operative N/V, HLD, HTN, elevated coronary calcium  score (78, 86th percentile 11/2021), OSA (intolerant to CPAP), murmur in pregnancy, GERD, hysterectomy (09/11/05), arthritis (left THA 06/28/14; s/p closed reduction for dislocation 12/03/21; right TKA 08/11/14; right THA 04/27/19), spinal surgery (L4-5 fusion 11/12/18), ankylosing spondylitis w/ HLA-B27 positive.  She is followed by cardiologist Dr. Maximo Spar for dyslipidemia with statin intolerance. She has a family history of CAD and CVA. There is a strong suspicion for familial HLD. Her CAC was 78.2, 86th percentile in 2023. She has been managed with Zetia  and Repatha . Last visit 03/08/23 with 12 month follow-up planned. No murmur per his exam on 10/10/21 initial evaluation.  ENT evaluation with Dr. Soldatova on 06/2823 for concern of laryngospasm, previously diagnosed 4-5 years prior. She did not think ST was helpful. Fiberoptic laryngoscopy showed, "The nasal cavity was patent without rhinorrhea or polyp. The nasopharynx was also patent without mass or lesion. The base of tongue was visualized and was normal. There were no signs of pooling of secretions in the piriform sinuses. The true vocal folds were mobile bilaterally. There were no signs of glottic or supraglottic mucosal lesion or mass. There was moderate interarytenoid pachydermia and post cricoid edema." Evidence of GERD/LPR, and possibly source of  laryngospasm. Continue Prilosec and diet and lifestyle changes to minimize reflux. Addition of Azelastine  puffs and Zyrte also recommended. Consider allergy testing. She declined ST for VCD.   She indicated that Dr. Jackee Marus had requested cardiac clearance. Abe Abed at his office to follow-up. Patient instructed to follow-up with prescriber regarding perioperative Humira  instructions. She is holding Celebrex  for one week prior to surgery. She denied chest pain and SOB.   Will leave chart for follow-up.  ADDENDUM 01/13/24 2:15 PM: She had preoperative telephonic cardiology evaluation today by Morey Ar, NP. She denied CV symptoms. No regular exercise program, but able to do light to moderate household tasks and yard work. She wrote, "According to the RCRI, patient has a 0.4% risk of MACE. Patient reports activity equivalent to 4.0 METS (performs light to moderate household tasks and yard work).    Given past medical history and time since last visit, based on ACC/AHA guidelines, Adrienne Campbell would be at acceptable risk for the planned procedure without further cardiovascular testing."   VS: BP 126/75   Pulse 87   Temp 36.8 C   Resp 17   Ht 5\' 3"  (1.6 m)   Wt 66.6 kg   SpO2 98%   BMI 26.00 kg/m   PROVIDERS: Koirala, Dibas, MD is PCP Dinah Franco, MD is cardiologist Stefan Edge, MD is rheumatologist Artice Last, MD is ENT   LABS: Labs reviewed: Acceptable for surgery. (all labs ordered are listed, but only abnormal results are displayed)  Labs Reviewed  BASIC METABOLIC PANEL WITH GFR - Abnormal; Notable for the following components:      Result Value   Sodium 133 (*)    All other components within normal limits  CBC -  Abnormal; Notable for the following components:   WBC 10.8 (*)    RBC 3.76 (*)    MCV 100.3 (*)    All other components within normal limits  SURGICAL PCR SCREEN    IMAGES: MRI C/T Spine 11/16/23: IMPRESSION: 1. Mild spinal canal  stenosis and severe left neural foraminal stenosis at C5-6. 2. Mild spinal canal stenosis and severe bilateral neural foraminal stenosis at C6-7. 3. Moderate right C2-3 neural foraminal stenosis. 4. Mild thoracic degenerative disc disease without spinal canal or neural foraminal stenosis.   MRI L-spine 10/15/23: IMPRESSION: 1. Posterior and interbody fusion at L4-L5 with solid arthrodesis. No evidence of hardware complication. 2. Advanced degenerative disc disease at L1-L2. 3. Advanced degenerative facet arthropathy bilaterally at L5-S1. 4. Aortic atherosclerosis (ICD10-I70.0).    EKG: 01/07/24: Normal sinus rhythm Possible Left atrial enlargement When compared with ECG of 03-Dec-2021 14:42, No significant change since last tracing Confirmed by Eilleen Grates ([id number]) on 01/07/2024 5:08:38 PM   CV: CT Coronary Calcium  Score 11/29/21: IMPRESSION: Coronary calcium  score of 78.2. This was 4 percentile for age-, race-, and sex-matched controls. Aortic atherosclerosis. RECOMMENDATIONS:...  If CAC is 1 to 99, it is reasonable to initiate statin therapy for patients >=28 years of age.   If CAC is >=100 or >=75th percentile, it is reasonable to initiate statin therapy at any age.   Cardiology referral should be considered for patients with CAC scores >=400 or >=75th percentile...   Past Medical History:  Diagnosis Date   Arthritis    Depression    Environmental and seasonal allergies    GERD (gastroesophageal reflux disease)    Heart murmur    during pregnancy   Hyperlipidemia    Hypertension    Insomnia    Pneumonia    hx of   PONV (postoperative nausea and vomiting)    Nausea, per pt. "felt loopy after hip surgery"   and made hallucinate   Sleep apnea    Pt unable to wear CPAP mask    Past Surgical History:  Procedure Laterality Date   ABDOMINAL HYSTERECTOMY  2007   COLONOSCOPY     HIP CLOSED REDUCTION Left 12/03/2021   Procedure: CLOSED REDUCTION HIP;   Surgeon: Sammye Cristal, MD;  [facility];  Service: Orthopedics;  Laterality: Left;   TOTAL HIP ARTHROPLASTY Left 06/28/2014   Procedure: LEFT TOTAL HIP ARTHROPLASTY;  Surgeon: Ilean Mall, MD;  [facility];  Service: Orthopedics;  Laterality: Left;   TOTAL HIP ARTHROPLASTY Right 04/27/2019   Procedure: Right Anterior Hip Arthroplasty;  Surgeon: Wendolyn Hamburger, MD;  Location: [facility];  Service: Orthopedics;  Laterality: Right;   TOTAL KNEE ARTHROPLASTY Right 08/11/2014   Procedure: RIGHT TOTAL KNEE ARTHROPLASTY;  Surgeon: Ilean Mall, MD;  [facility];  Service: Orthopedics;  Laterality: Right;   TRANSFORAMINAL LUMBAR INTERBODY FUSION (TLIF) WITH PEDICLE SCREW FIXATION 1 LEVEL Right 11/12/2018   Procedure: RIGHT-SIDED LUMBAR 4-5 TRANSFORAMINAL LUMBAR INTERBODY FUSION WITH INSTRUMENTATION AND ALLOGRAFT;  Surgeon: Virl Grimes, MD;  [facility];  Service: Orthopedics;  Laterality: Right;    MEDICATIONS:  acetaminophen  (TYLENOL ) 650 MG CR tablet   acyclovir  (ZOVIRAX ) 200 MG capsule   adalimumab  (HUMIRA , 2 SYRINGE,) 40 MG/0.8ML prefilled syringe   alclomethasone (ACLOVATE ) 0.05 % cream   celecoxib  (CELEBREX ) 200 MG capsule   cetirizine  (ZYRTEC ) 10 MG tablet   cholecalciferol  (VITAMIN D ) 25 MCG (1000 UT) tablet   CITRUS BERGAMOT PO   Cyanocobalamin  (VITAMIN B-12) 5000 MCG TBDP  cyclobenzaprine  (FLEXERIL ) 10 MG tablet   Dapsone  5 % topical gel   diazepam  (VALIUM ) 10 MG tablet   estradiol  (ESTRACE ) 0.1 MG/GM vaginal cream   Estradiol  (VAGIFEM ) 10 MCG TABS vaginal tablet   Evolocumab  (REPATHA  SURECLICK) 140 MG/ML SOAJ   ezetimibe  (ZETIA ) 10 MG tablet   hydrOXYzine  (VISTARIL ) 25 MG capsule   lisinopril -hydrochlorothiazide  (ZESTORETIC ) 10-12.5 MG tablet   Meth-Hyo-M Bl-Benz Acd-Ph Sal (URIBEL ) 81.6 MG TABS   mupirocin  ointment (BACTROBAN ) 2 %   Omega-3 1000 MG CAPS   omeprazole  (PRILOSEC) 40 MG capsule   Polyethyl Glycol-Propyl Glycol 0.4-0.3 % SOLN   venlafaxine  XR  (EFFEXOR -XR) 150 MG 24 hr capsule   vitamin E  400 UNIT capsule   No current facility-administered medications for this encounter.    Ella Gun, PA-C Surgical Short Stay/Anesthesiology [facility] Phone [phone] [facility] Phone [phone] 01/08/2024 2:39 PM

## 2024-01-08 NOTE — Telephone Encounter (Signed)
 Left Message to call our office and ask for the Preop team to schedule Preop TELE appt for Cardic Clearance.

## 2024-01-08 NOTE — Telephone Encounter (Signed)
   Name: Adrienne Campbell  DOB: 1961-07-19  MRN: [id number]  Primary Cardiologist: None  Preoperative team, please contact this patient and set up a phone call appointment for further preoperative risk assessment. Please obtain consent and complete medication review. Thank you for your help.  I confirm that guidance regarding antiplatelet and oral anticoagulation therapy has been completed and, if necessary, noted below. None requested.   I also confirmed the patient resides in the state of [place] . As per [facility] Medical Board telemedicine laws, the patient must reside in the state in which the provider is licensed.  Carie Kapuscinski D Rorik Vespa, NP 01/08/2024, 3:33 PM  [facility]

## 2024-01-09 ENCOUNTER — Telehealth: Payer: Self-pay | Admitting: *Deleted

## 2024-01-09 NOTE — Telephone Encounter (Signed)
 Pt called back and has been scheduled tele preop appt 01/13/24.  Med rec and consent are done.      Patient Consent for Virtual Visit        Adrienne Campbell has provided verbal consent on 01/09/2024 for a virtual visit (video or telephone).   CONSENT FOR VIRTUAL VISIT FOR:  Adrienne Campbell  By participating in this virtual visit I agree to the following:  I hereby voluntarily request, consent and authorize [facility] and its employed or contracted physicians, physician assistants, nurse practitioners or other licensed health care professionals (the Practitioner), to provide me with telemedicine health care services (the "Services") as deemed necessary by the treating Practitioner. I acknowledge and consent to receive the Services by the Practitioner via telemedicine. I understand that the telemedicine visit will involve communicating with the Practitioner through live audiovisual communication technology and the disclosure of certain medical information by electronic transmission. I acknowledge that I have been given the opportunity to request an in-person assessment or other available alternative prior to the telemedicine visit and am voluntarily participating in the telemedicine visit.  I understand that I have the right to withhold or withdraw my consent to the use of telemedicine in the course of my care at any time, without affecting my right to future care or treatment, and that the Practitioner or I may terminate the telemedicine visit at any time. I understand that I have the right to inspect all information obtained and/or recorded in the course of the telemedicine visit and may receive copies of available information for a reasonable fee.  I understand that some of the potential risks of receiving the Services via telemedicine include:  Delay or interruption in medical evaluation due to technological equipment failure or disruption; Information transmitted may not  be sufficient (e.g. poor resolution of images) to allow for appropriate medical decision making by the Practitioner; and/or  In rare instances, security protocols could fail, causing a breach of personal health information.  Furthermore, I acknowledge that it is my responsibility to provide information about my medical history, conditions and care that is complete and accurate to the best of my ability. I acknowledge that Practitioner's advice, recommendations, and/or decision may be based on factors not within their control, such as incomplete or inaccurate data provided by me or distortions of diagnostic images or specimens that may result from electronic transmissions. I understand that the practice of medicine is not an exact science and that Practitioner makes no warranties or guarantees regarding treatment outcomes. I acknowledge that a copy of this consent can be made available to me via my patient portal [facility] MyChart), or I can request a printed copy by calling the office of  [facility].    I understand that my insurance will be billed for this visit.   I have read or had this consent read to me. I understand the contents of this consent, which adequately explains the benefits and risks of the Services being provided via telemedicine.  I have been provided ample opportunity to ask questions regarding this consent and the Services and have had my questions answered to my satisfaction. I give my informed consent for the services to be provided through the use of telemedicine in my medical care

## 2024-01-09 NOTE — Telephone Encounter (Signed)
 Pt called back and has been scheduled tele preop appt 01/13/24.  Med rec and consent are done.

## 2024-01-09 NOTE — Telephone Encounter (Signed)
Patient returned Pre-op call. 

## 2024-01-09 NOTE — Telephone Encounter (Signed)
 Returned pt's call to schedule tele preop appt. Left message to call back to schedule tele preop appt.

## 2024-01-13 ENCOUNTER — Ambulatory Visit: Attending: Internal Medicine | Admitting: Student

## 2024-01-13 DIAGNOSIS — Z01810 Encounter for preprocedural cardiovascular examination: Secondary | ICD-10-CM | POA: Diagnosis not present

## 2024-01-13 NOTE — Progress Notes (Signed)
 Virtual Visit via Telephone Note   Because of Adrienne Campbell co-morbid illnesses, she is at least at moderate risk for complications without adequate follow up.  This format is felt to be most appropriate for this patient at this time.  The patient did not have access to video technology/had technical difficulties with video requiring transitioning to audio format only (telephone).  All issues noted in this document were discussed and addressed.  No physical exam could be performed with this format.  Please refer to the patient's chart for her consent to telehealth for [facility].  Evaluation Performed:  Preoperative cardiovascular risk assessment _____________   Date:  01/13/2024   Patient ID:  Adrienne Campbell, DOB 02/08/1961, MRN [id number] Patient Location:  Home Provider location:   Office  Primary Care Provider:  Lanae Pinal, MD Primary Cardiologist:  Hazle Lites, MD  Chief Complaint / Patient Profile   63 y.o. y/o female with a h/o hyperlipidemia who is pending anterior cervical decompression and fusion C5-6, C6-7 with instrumentation and allograft by Dr. Jackee Marus and presents today for telephonic preoperative cardiovascular risk assessment.  History of Present Illness    Adrienne Campbell is a 63 y.o. female who presents via audio/video conferencing for a telehealth visit today.  Pt was last seen in cardiology clinic on 03/08/2023 by Dr. Maximo Spar.  At that time Adrienne Campbell was stable from a cardiac standpoint.  The patient is now pending procedure as outlined above. Since her last visit, she is doing well. Patient denies shortness of breath, dyspnea on exertion, lower extremity edema, orthopnea or PND. No chest pain, pressure, or tightness. No palpitations. She does not participate in regular exercise. She is able to perform light to moderate household tasks and does yard work.   Past Medical History    Past Medical History:   Diagnosis Date   Ankylosing spondylitis (HCC)    HLAl-B27 positive (Dr. Stefan Edge)   Arthritis    Depression    Environmental and seasonal allergies    GERD (gastroesophageal reflux disease)    with LPR, laryngospasm   Heart murmur    during pregnancy   Hyperlipidemia    Hypertension    Insomnia    Pneumonia    hx of   PONV (postoperative nausea and vomiting)    Nausea, per pt. "felt loopy after hip surgery"   and made hallucinate   Sleep apnea    Pt unable to wear CPAP mask   Past Surgical History:  Procedure Laterality Date   ABDOMINAL HYSTERECTOMY  2007   COLONOSCOPY     HIP CLOSED REDUCTION Left 12/03/2021   Procedure: CLOSED REDUCTION HIP;  Surgeon: Sammye Cristal, MD;  [facility];  Service: Orthopedics;  Laterality: Left;   TOTAL HIP ARTHROPLASTY Left 06/28/2014   Procedure: LEFT TOTAL HIP ARTHROPLASTY;  Surgeon: Ilean Mall, MD;  [facility];  Service: Orthopedics;  Laterality: Left;   TOTAL HIP ARTHROPLASTY Right 04/27/2019   Procedure: Right Anterior Hip Arthroplasty;  Surgeon: Wendolyn Hamburger, MD;  [facility];  Service: Orthopedics;  Laterality: Right;   TOTAL KNEE ARTHROPLASTY Right 08/11/2014   Procedure: RIGHT TOTAL KNEE ARTHROPLASTY;  Surgeon: Ilean Mall, MD;  [facility];  Service: Orthopedics;  Laterality: Right;   TRANSFORAMINAL LUMBAR INTERBODY FUSION (TLIF) WITH PEDICLE SCREW FIXATION 1 LEVEL Right 11/12/2018   Procedure: RIGHT-SIDED LUMBAR 4-5 TRANSFORAMINAL LUMBAR INTERBODY FUSION WITH INSTRUMENTATION AND ALLOGRAFT;  Surgeon: Virl Grimes, MD;  Location: [facility]  OR;  Service: Orthopedics;  Laterality: Right;    Allergies  Allergies  Allergen Reactions   Nsaids Hives    Specifically meloxicam and ibuprofen reported   Onion Swelling    SWELLING REACTION UNSPECIFIED    Sulfa Antibiotics Swelling and Anaphylaxis    Patient reports tongue swells   Fluticasone Other (See Comments)    ulcers   Oxycodone  Nausea Only    Severe  nausea; pt has to take with Zofran    Tramadol Hives   Hydrocodone-Acetaminophen  Other (See Comments)    Hallucinations   Ibuprofen     Other reaction(s): hives/itching   Meloxicam     Other reaction(s): pruritus   Rosuvastatin  Calcium      Other reaction(s): cramps   Tizanidine  Hcl     Other reaction(s): Hallucinations   Codeine Nausea And Vomiting    Home Medications    Prior to Admission medications   Medication Sig Start Date End Date Taking? Authorizing Provider  acetaminophen  (TYLENOL ) 650 MG CR tablet 1,300 mg daily as needed for pain.    [provider]  acyclovir  (ZOVIRAX ) 200 MG capsule Take 2 capsules (400 mg total) by mouth 3 (three) times daily for 5 days for recurrence. Patient not taking: Reported on 01/09/2024 12/26/23     adalimumab  (HUMIRA , 2 SYRINGE,) 40 MG/0.8ML prefilled syringe Inject 0.8 mL Subcutaneous Once a week 30 days 10/31/23   Jegede, Olugbemiga E, MD  alclomethasone (ACLOVATE ) 0.05 % cream Apply topically to the affected areas up to 2 times a day as needed 08/03/21     celecoxib  (CELEBREX ) 200 MG capsule Take 1 capsule (200 mg total) by mouth 2 (two) times daily. 10/14/23     cetirizine  (ZYRTEC ) 10 MG tablet Take 1 tablet (10 mg total) by mouth daily. Patient not taking: Reported on 01/07/2024 07/01/23   Soldatova, Liuba, MD  cholecalciferol  (VITAMIN D ) 25 MCG (1000 UT) tablet Take 1,000 Units by mouth daily.    [provider]  CITRUS BERGAMOT PO Take 1 tablet by mouth every morning.     [provider]  Cyanocobalamin  (VITAMIN B-12) 5000 MCG TBDP Take 5,000 mcg by mouth daily.    [provider]  cyclobenzaprine  (FLEXERIL ) 10 MG tablet Take 1 tablet (10 mg total) by mouth daily. 10/14/23     Dapsone  5 % topical gel Apply 1 application topically daily as needed. 02/26/19   [provider]  diazepam  (VALIUM ) 10 MG tablet Insert 1 tablet (10 mg total)  vaginally every 12 (twelve) hours as needed. 07/31/23     estradiol   (ESTRACE ) 0.1 MG/GM vaginal cream Apply  1/2 to 1 gram at the vaginal introitus at bedtime twice a week as directed. 04/19/23     Estradiol  (VAGIFEM ) 10 MCG TABS vaginal tablet Insert 1 tablet vaginally 2 times a week 04/19/23     Evolocumab  (REPATHA  SURECLICK) 140 MG/ML SOAJ Inject 140 mg into the skin every 14 (fourteen) days. 04/18/23   Hilty, Aviva Lemmings, MD  ezetimibe  (ZETIA ) 10 MG tablet Take 1 tablet by mouth daily. 02/11/23 02/20/24  Hazle Lites, MD  hydrOXYzine  (VISTARIL ) 25 MG capsule Take 1 capsule (25 mg total) by mouth at bedtime. 12/26/23     lisinopril -hydrochlorothiazide  (ZESTORETIC ) 10-12.5 MG tablet Take 1 tablet by mouth daily. 12/26/23     Meth-Hyo-M Bl-Benz Acd-Ph Sal (URIBEL ) 81.6 MG TABS Take 1 tablet (81.6 mg total) by mouth every 6 (six) hours as needed. 07/31/23     mupirocin  ointment (BACTROBAN ) 2 % Apply to affected  area twice daily for 5 days 06/25/23     Omega-3 1000 MG CAPS Take 1,000 mg by mouth at bedtime.    [provider]  omeprazole  (PRILOSEC) 40 MG capsule Take 1 capsule (40 mg) by mouth once daily 30 minutes before morning meal 11/04/23     Polyethyl Glycol-Propyl Glycol 0.4-0.3 % SOLN Place 1 drop into both eyes 4 (four) times daily as needed (dry/irritated eyes).    [provider]  venlafaxine  XR (EFFEXOR -XR) 150 MG 24 hr capsule Take 1 capsule (150 mg total) by mouth daily with food. 12/26/23     vitamin E  400 UNIT capsule Take 400 Units by mouth at bedtime.     [provider]    Physical Exam    Vital Signs:  Adrienne Campbell does not have vital signs available for review today.  Given telephonic nature of communication, physical exam is limited. AAOx3. NAD. Normal affect.  Speech and respirations are unlabored.   Assessment & Plan    Primary Cardiologist: Hazle Lites, MD  Preoperative cardiovascular risk assessment.  Anterior cervical decompression fusion C5-6, C6-7 with instrumentation and allograft by Dr.  Jackee Marus on 01/16/2024.  Chart reviewed as part of pre-operative protocol coverage. According to the RCRI, patient has a 0.4% risk of MACE. Patient reports activity equivalent to 4.0 METS (performs light to moderate household tasks and yard work).   Given past medical history and time since last visit, based on ACC/AHA guidelines, Adrienne Campbell would be at acceptable risk for the planned procedure without further cardiovascular testing.   Patient was advised that if she develops new symptoms prior to surgery to contact our office to arrange a follow-up appointment.  she verbalized understanding.  I will route this recommendation to the requesting party via Epic fax function.  Please call with questions.  Time:   Today, I have spent 5 minutes with the patient with telehealth technology discussing medical history, symptoms, and management plan.     Morey Ar, NP  01/13/2024, 8:14 AM

## 2024-01-14 DIAGNOSIS — M47816 Spondylosis without myelopathy or radiculopathy, lumbar region: Secondary | ICD-10-CM | POA: Diagnosis not present

## 2024-01-15 NOTE — Progress Notes (Signed)
 left voicemail with new arrival time of 0800 and finish ERAS drink by 0800

## 2024-01-16 ENCOUNTER — Ambulatory Visit (HOSPITAL_COMMUNITY)

## 2024-01-16 ENCOUNTER — Other Ambulatory Visit: Payer: Self-pay

## 2024-01-16 ENCOUNTER — Ambulatory Visit (HOSPITAL_COMMUNITY)
Admission: RE | Admit: 2024-01-16 | Discharge: 2024-01-16 | Disposition: A | Discharge status: Home / Self Care | Attending: Orthopedic Surgery | Admitting: Orthopedic Surgery

## 2024-01-16 ENCOUNTER — Ambulatory Visit (HOSPITAL_COMMUNITY): Payer: Self-pay | Admitting: Vascular Surgery

## 2024-01-16 ENCOUNTER — Other Ambulatory Visit: Payer: Self-pay | Admitting: Pharmacy Technician

## 2024-01-16 ENCOUNTER — Encounter (HOSPITAL_COMMUNITY)
Admission: RE | Disposition: A | Discharge status: Home / Self Care | Payer: Self-pay | Source: Home / Self Care | Attending: Orthopedic Surgery

## 2024-01-16 ENCOUNTER — Ambulatory Visit (HOSPITAL_BASED_OUTPATIENT_CLINIC_OR_DEPARTMENT_OTHER): Payer: Self-pay | Admitting: Anesthesiology

## 2024-01-16 ENCOUNTER — Other Ambulatory Visit (HOSPITAL_COMMUNITY): Payer: Self-pay

## 2024-01-16 ENCOUNTER — Encounter (HOSPITAL_COMMUNITY): Payer: Self-pay | Admitting: Orthopedic Surgery

## 2024-01-16 DIAGNOSIS — E785 Hyperlipidemia, unspecified: Secondary | ICD-10-CM | POA: Diagnosis not present

## 2024-01-16 DIAGNOSIS — Z981 Arthrodesis status: Secondary | ICD-10-CM | POA: Diagnosis not present

## 2024-01-16 DIAGNOSIS — G4730 Sleep apnea, unspecified: Secondary | ICD-10-CM

## 2024-01-16 DIAGNOSIS — F32A Depression, unspecified: Secondary | ICD-10-CM

## 2024-01-16 DIAGNOSIS — K219 Gastro-esophageal reflux disease without esophagitis: Secondary | ICD-10-CM | POA: Diagnosis not present

## 2024-01-16 DIAGNOSIS — I10 Essential (primary) hypertension: Secondary | ICD-10-CM | POA: Insufficient documentation

## 2024-01-16 DIAGNOSIS — M4802 Spinal stenosis, cervical region: Secondary | ICD-10-CM | POA: Diagnosis not present

## 2024-01-16 DIAGNOSIS — M5412 Radiculopathy, cervical region: Secondary | ICD-10-CM | POA: Diagnosis not present

## 2024-01-16 DIAGNOSIS — I2510 Atherosclerotic heart disease of native coronary artery without angina pectoris: Secondary | ICD-10-CM

## 2024-01-16 SURGERY — ANTERIOR CERVICAL DECOMPRESSION/DISCECTOMY FUSION 2 LEVELS
Anesthesia: General | Site: Neck

## 2024-01-16 MED ORDER — LIDOCAINE 2% (20 MG/ML) 5 ML SYRINGE
INTRAMUSCULAR | Status: AC
  Filled 2024-01-16: qty 5

## 2024-01-16 MED ORDER — AMISULPRIDE (ANTIEMETIC) 5 MG/2ML IV SOLN: 10.0000 mg | Freq: Once | INTRAVENOUS | Status: DC | PRN

## 2024-01-16 MED ORDER — SODIUM CHLORIDE (PF) 0.9 % IJ SOLN
INTRAMUSCULAR | Status: AC
  Filled 2024-01-16: qty 10

## 2024-01-16 MED ORDER — PROPOFOL 10 MG/ML IV BOLUS
INTRAVENOUS | Status: AC
  Filled 2024-01-16: qty 20

## 2024-01-16 MED ORDER — OXYCODONE HCL 5 MG/5ML PO SOLN: 5.0000 mg | Freq: Once | ORAL | Status: AC | PRN

## 2024-01-16 MED ORDER — BUPIVACAINE LIPOSOME 1.3 % IJ SUSP
INTRAMUSCULAR | Status: AC
  Filled 2024-01-16: qty 20

## 2024-01-16 MED ORDER — CHLORHEXIDINE GLUCONATE 0.12 % MT SOLN
15.0000 mL | Freq: Once | OROMUCOSAL | Status: AC
  Administered 2024-01-16: 15 mL via OROMUCOSAL
  Filled 2024-01-16: qty 15

## 2024-01-16 MED ORDER — OXYCODONE HCL 5 MG PO TABS
5.0000 mg | ORAL_TABLET | Freq: Once | ORAL | Status: AC | PRN
  Administered 2024-01-16: 5 mg via ORAL

## 2024-01-16 MED ORDER — DEXMEDETOMIDINE HCL IN NACL 80 MCG/20ML IV SOLN
INTRAVENOUS | Status: AC
Start: 2024-01-16 — End: ?
  Filled 2024-01-16: qty 20

## 2024-01-16 MED ORDER — ORAL CARE MOUTH RINSE: 15.0000 mL | Freq: Once | OROMUCOSAL | Status: AC

## 2024-01-16 MED ORDER — ONDANSETRON HCL 4 MG/2ML IJ SOLN
INTRAMUSCULAR | Status: DC | PRN
  Administered 2024-01-16: 4 mg via INTRAVENOUS

## 2024-01-16 MED ORDER — ROCURONIUM BROMIDE 10 MG/ML (PF) SYRINGE
PREFILLED_SYRINGE | INTRAVENOUS | Status: AC
  Filled 2024-01-16: qty 10

## 2024-01-16 MED ORDER — BUPIVACAINE-EPINEPHRINE (PF) 0.25% -1:200000 IJ SOLN
INTRAMUSCULAR | Status: AC
Start: 2024-01-16 — End: ?
  Filled 2024-01-16: qty 30

## 2024-01-16 MED ORDER — FENTANYL CITRATE (PF) 100 MCG/2ML IJ SOLN
INTRAMUSCULAR | Status: AC
  Filled 2024-01-16: qty 2

## 2024-01-16 MED ORDER — THROMBIN (RECOMBINANT) 20000 UNITS EX SOLR
CUTANEOUS | Status: AC
Start: 2024-01-16 — End: ?
  Filled 2024-01-16: qty 20000

## 2024-01-16 MED ORDER — HYDROMORPHONE HCL 1 MG/ML IJ SOLN
INTRAMUSCULAR | Status: AC
  Filled 2024-01-16: qty 0.5

## 2024-01-16 MED ORDER — PROPOFOL 10 MG/ML IV BOLUS
INTRAVENOUS | Status: DC | PRN
  Administered 2024-01-16: 50 mg via INTRAVENOUS
  Administered 2024-01-16: 125 ug/kg/min via INTRAVENOUS
  Administered 2024-01-16: 150 mg via INTRAVENOUS

## 2024-01-16 MED ORDER — LABETALOL HCL 5 MG/ML IV SOLN
INTRAVENOUS | Status: DC | PRN
  Administered 2024-01-16: 5 mg via INTRAVENOUS

## 2024-01-16 MED ORDER — MIDAZOLAM HCL 2 MG/2ML IJ SOLN
INTRAMUSCULAR | Status: DC | PRN
Start: 2024-01-16 — End: 2024-01-16
  Administered 2024-01-16: 2 mg via INTRAVENOUS

## 2024-01-16 MED ORDER — HYDROMORPHONE HCL 1 MG/ML IJ SOLN
INTRAMUSCULAR | Status: DC | PRN
  Administered 2024-01-16: .5 mg via INTRAVENOUS

## 2024-01-16 MED ORDER — POVIDONE-IODINE 7.5 % EX SOLN
Freq: Once | CUTANEOUS | Status: DC
Start: 2024-01-16 — End: 2024-01-16
  Filled 2024-01-16: qty 118

## 2024-01-16 MED ORDER — BUPIVACAINE-EPINEPHRINE 0.25% -1:200000 IJ SOLN
INTRAMUSCULAR | Status: DC | PRN
  Administered 2024-01-16: 6 mL

## 2024-01-16 MED ORDER — FENTANYL CITRATE (PF) 100 MCG/2ML IJ SOLN
INTRAMUSCULAR | Status: DC | PRN
  Administered 2024-01-16: 100 ug via INTRAVENOUS

## 2024-01-16 MED ORDER — CEFAZOLIN SODIUM-DEXTROSE 2-4 GM/100ML-% IV SOLN
2.0000 g | INTRAVENOUS | Status: AC
  Administered 2024-01-16: 2 g via INTRAVENOUS
  Filled 2024-01-16: qty 100

## 2024-01-16 MED ORDER — ONDANSETRON HCL 4 MG/2ML IJ SOLN
INTRAMUSCULAR | Status: AC
  Filled 2024-01-16: qty 2

## 2024-01-16 MED ORDER — METHOCARBAMOL 500 MG PO TABS
500.0000 mg | ORAL_TABLET | Freq: Four times a day (QID) | ORAL | 0 refills | Status: DC
  Filled 2024-01-16: qty 60, 8d supply, fill #0

## 2024-01-16 MED ORDER — HYDROCODONE-ACETAMINOPHEN 5-325 MG PO TABS
1.0000 | ORAL_TABLET | Freq: Four times a day (QID) | ORAL | 0 refills | Status: DC | PRN
  Filled 2024-01-16: qty 20, 5d supply, fill #0

## 2024-01-16 MED ORDER — LACTATED RINGERS IV SOLN: INTRAVENOUS | Status: DC

## 2024-01-16 MED ORDER — THROMBIN 20000 UNITS EX SOLR
CUTANEOUS | Status: DC | PRN
  Administered 2024-01-16: 20000 [IU] via TOPICAL

## 2024-01-16 MED ORDER — PROPOFOL 1000 MG/100ML IV EMUL
INTRAVENOUS | Status: AC
  Filled 2024-01-16: qty 100

## 2024-01-16 MED ORDER — LIDOCAINE 2% (20 MG/ML) 5 ML SYRINGE
INTRAMUSCULAR | Status: AC
Start: 2024-01-16 — End: ?
  Filled 2024-01-16: qty 5

## 2024-01-16 MED ORDER — ROCURONIUM BROMIDE 10 MG/ML (PF) SYRINGE
PREFILLED_SYRINGE | INTRAVENOUS | Status: DC | PRN
  Administered 2024-01-16 (×3): 10 mg via INTRAVENOUS
  Administered 2024-01-16: 50 mg via INTRAVENOUS

## 2024-01-16 MED ORDER — MIDAZOLAM HCL 2 MG/2ML IJ SOLN
INTRAMUSCULAR | Status: AC
  Filled 2024-01-16: qty 2

## 2024-01-16 MED ORDER — DEXAMETHASONE SODIUM PHOSPHATE 10 MG/ML IJ SOLN
INTRAMUSCULAR | Status: AC
  Filled 2024-01-16: qty 1

## 2024-01-16 MED ORDER — ACETAMINOPHEN 500 MG PO TABS
1000.0000 mg | ORAL_TABLET | Freq: Once | ORAL | Status: AC
  Administered 2024-01-16: 1000 mg via ORAL
  Filled 2024-01-16: qty 2

## 2024-01-16 MED ORDER — DEXMEDETOMIDINE HCL IN NACL 80 MCG/20ML IV SOLN
INTRAVENOUS | Status: DC | PRN
  Administered 2024-01-16: 8 ug via INTRAVENOUS

## 2024-01-16 MED ORDER — LIDOCAINE 2% (20 MG/ML) 5 ML SYRINGE
INTRAMUSCULAR | Status: DC | PRN
  Administered 2024-01-16: 100 mg via INTRAVENOUS
  Administered 2024-01-16: 50 mg via INTRAVENOUS

## 2024-01-16 MED ORDER — OXYCODONE HCL 5 MG PO TABS
ORAL_TABLET | ORAL | Status: AC
  Filled 2024-01-16: qty 1

## 2024-01-16 MED ORDER — FENTANYL CITRATE (PF) 100 MCG/2ML IJ SOLN
25.0000 ug | INTRAMUSCULAR | Status: DC | PRN
  Administered 2024-01-16: 50 ug via INTRAVENOUS

## 2024-01-16 MED ORDER — PHENYLEPHRINE 80 MCG/ML (10ML) SYRINGE FOR IV PUSH (FOR BLOOD PRESSURE SUPPORT)
PREFILLED_SYRINGE | INTRAVENOUS | Status: AC
  Filled 2024-01-16: qty 10

## 2024-01-16 MED ORDER — SUGAMMADEX SODIUM 200 MG/2ML IV SOLN
INTRAVENOUS | Status: DC | PRN
  Administered 2024-01-16: 200 mg via INTRAVENOUS

## 2024-01-16 SURGICAL SUPPLY — 61 items
BAG COUNTER SPONGE SURGICOUNT (BAG) ×1 IMPLANT
BENZOIN TINCTURE PRP APPL 2/3 (GAUZE/BANDAGES/DRESSINGS) ×1 IMPLANT
BIT DRILL NEURO 2X3.1 SFT TUCH (MISCELLANEOUS) ×1 IMPLANT
BIT DRILL SRG 14X2.2XFLT CHK (BIT) IMPLANT
BLADE CLIPPER SURG (BLADE) ×1 IMPLANT
BLADE SURG 15 STRL LF DISP TIS (BLADE) ×1 IMPLANT
BONE CERV LORDOTIC 14.5X12X6 (Bone Implant) ×2 IMPLANT
COLLAR CERV LO CONTOUR FIRM DE (SOFTGOODS) IMPLANT
CORD BIPOLAR FORCEPS 12FT (ELECTRODE) ×1 IMPLANT
COVER SURGICAL LIGHT HANDLE (MISCELLANEOUS) ×1 IMPLANT
DRAPE C-ARM 42X72 X-RAY (DRAPES) ×1 IMPLANT
DRAPE POUCH INSTRU U-SHP 10X18 (DRAPES) ×1 IMPLANT
DRAPE SURG 17X23 STRL (DRAPES) ×4 IMPLANT
DURAPREP 26ML APPLICATOR (WOUND CARE) ×1 IMPLANT
ELECT COATED BLADE 2.86 ST (ELECTRODE) ×1 IMPLANT
ELECTRODE REM PT RTRN 9FT ADLT (ELECTROSURGICAL) ×1 IMPLANT
EVACUATOR SILICONE 100CC (DRAIN) IMPLANT
GAUZE 4X4 16PLY ~~LOC~~+RFID DBL (SPONGE) ×1 IMPLANT
GAUZE SPONGE 4X4 12PLY STRL (GAUZE/BANDAGES/DRESSINGS) ×1 IMPLANT
GLOVE BIO SURGEON STRL SZ 6.5 (GLOVE) ×1 IMPLANT
GLOVE BIO SURGEON STRL SZ8 (GLOVE) ×1 IMPLANT
GLOVE BIOGEL PI IND STRL 7.0 (GLOVE) ×2 IMPLANT
GLOVE BIOGEL PI IND STRL 8 (GLOVE) ×1 IMPLANT
GLOVE SURG ENC MOIS LTX SZ6.5 (GLOVE) ×1 IMPLANT
GOWN STRL REUS W/ TWL LRG LVL3 (GOWN DISPOSABLE) ×1 IMPLANT
GOWN STRL REUS W/ TWL XL LVL3 (GOWN DISPOSABLE) ×1 IMPLANT
GRAFT BNE SPCR VG2 14.5X12X6 (Bone Implant) IMPLANT
IV CATH 14GX2 1/4 (CATHETERS) ×1 IMPLANT
KIT BASIN OR (CUSTOM PROCEDURE TRAY) ×1 IMPLANT
KIT TURNOVER KIT B (KITS) ×1 IMPLANT
MANIFOLD NEPTUNE II (INSTRUMENTS) ×1 IMPLANT
NDL PRECISIONGLIDE 27X1.5 (NEEDLE) ×1 IMPLANT
NDL SPNL 20GX3.5 QUINCKE YW (NEEDLE) ×1 IMPLANT
NEEDLE PRECISIONGLIDE 27X1.5 (NEEDLE) ×1 IMPLANT
NEEDLE SPNL 20GX3.5 QUINCKE YW (NEEDLE) ×1 IMPLANT
NS IRRIG 1000ML POUR BTL (IV SOLUTION) ×1 IMPLANT
PACK ORTHO CERVICAL (CUSTOM PROCEDURE TRAY) ×1 IMPLANT
PAD ARMBOARD POSITIONER FOAM (MISCELLANEOUS) ×2 IMPLANT
PATTIES SURGICAL .5 X.5 (GAUZE/BANDAGES/DRESSINGS) IMPLANT
PATTIES SURGICAL .5 X1 (DISPOSABLE) ×1 IMPLANT
PENCIL BUTTON HOLSTER BLD 10FT (ELECTRODE) IMPLANT
PIN DISTRACTION 14 (PIN) IMPLANT
PLATE SKYLINE TWO LEVEL 28MM (Plate) IMPLANT
POSITIONER HEAD DONUT 9IN (MISCELLANEOUS) ×1 IMPLANT
SCREW SKYLINE VAR OS 14MM (Screw) IMPLANT
SPIKE FLUID TRANSFER (MISCELLANEOUS) ×1 IMPLANT
SPONGE INTESTINAL PEANUT (DISPOSABLE) ×1 IMPLANT
SPONGE SURGIFOAM ABS GEL 100 (HEMOSTASIS) ×1 IMPLANT
STRIP CLOSURE SKIN 1/2X4 (GAUZE/BANDAGES/DRESSINGS) ×1 IMPLANT
SURGIFLO W/THROMBIN 8M KIT (HEMOSTASIS) IMPLANT
SUT MNCRL AB 4-0 PS2 18 (SUTURE) ×1 IMPLANT
SUT SILK 4-0 18XBRD TIE 12 (SUTURE) IMPLANT
SUT VIC AB 2-0 CT2 18 VCP726D (SUTURE) ×1 IMPLANT
SYR BULB IRRIG 60ML STRL (SYRINGE) ×1 IMPLANT
SYR CONTROL 10ML LL (SYRINGE) ×3 IMPLANT
TAPE CLOTH 4X10 WHT NS (GAUZE/BANDAGES/DRESSINGS) ×1 IMPLANT
TAPE UMBILICAL 1/8X30 (MISCELLANEOUS) ×2 IMPLANT
TOWEL GREEN STERILE (TOWEL DISPOSABLE) ×1 IMPLANT
TOWEL GREEN STERILE FF (TOWEL DISPOSABLE) ×1 IMPLANT
WATER STERILE IRR 1000ML POUR (IV SOLUTION) ×1 IMPLANT
YANKAUER SUCT BULB TIP NO VENT (SUCTIONS) ×1 IMPLANT

## 2024-01-16 NOTE — Op Note (Signed)
 PATIENT NAME: Adrienne Campbell   MEDICAL RECORD NO.:   [id number]    DATE OF BIRTH: Jan 18, 1961   DATE OF PROCEDURE: 01/16/2024                               OPERATIVE REPORT     PREOPERATIVE DIAGNOSES: 1. Bilateral cervical radiculopathy 2. Spinal stenosis spanning C5-C7   POSTOPERATIVE DIAGNOSES: 1. Bilateral cervical radiculopathy 2. Spinal stenosis spanning C5-C7   PROCEDURE: 1. Anterior cervical decompression and fusion 5/6, C6/7. 2. Placement of anterior instrumentation, C5-C7 (Of note, the anterior instrumentation was separate from, and not integral to, the intervertebral spacers) 3. Insertion of structural allograft x 2 (6 mm VG-2 allograft intervertebral spacers). 4. Intraoperative use of fluoroscopy.   SURGEON:  Virl Grimes, MD   ASSISTANT:  Geraline Knapp, PA-C.   ANESTHESIA:  General endotracheal anesthesia.   COMPLICATIONS:  None.   DISPOSITION:  Stable.   ESTIMATED BLOOD LOSS:  Minimal.   INDICATIONS FOR SURGERY:  Briefly, Adrienne Campbell is a pleasant 63 y.o. year- old female, who did present to me with severe pain in the neck and bilateral arms.  The patient's MRI did reveal the findings noted above.  Given the patient's ongoing rather debilitating pain and lack of improvement with appropriate treatment measures, we did discuss proceeding with the procedure noted above.  The patient was fully aware of the risks and limitations of surgery as outlined in my preoperative note.   OPERATIVE DETAILS:  On 01/16/2024, the patient was brought to surgery and general endotracheal anesthesia was administered.  The patient was placed supine on the hospital bed. The neck was gently extended.  All bony prominences were meticulously padded.  The neck was prepped and draped in the usual sterile fashion.  At this point, I did make a left-sided transverse incision.  The platysma was incised.  A Smith-Robinson approach was used and the anterior spine was identified. A  self-retaining retractor was placed.  I then subperiosteally exposed the vertebral bodies from C5-C7.  Caspar pins were then placed into the C6 and C7 vertebral bodies and distraction was applied.  A thorough and complete C6-7 intervertebral diskectomy was performed.  The posterior longitudinal ligament was identified and entered using a nerve hook.  I then used #1 followed by #2 Kerrison to perform a thorough and complete intervertebral diskectomy.  The spinal canal was thoroughly decompressed, as was the right and left neuroforamen.  The endplates were then prepared and the appropriate-sized allograft intervertebral spacer was then tamped into position in the usual fashion.  The lower Caspar pin was then removed and placed into the C5 vertebral body and once again, distraction was applied across the C5-6 intervertebral space.  I then again performed a thorough and complete diskectomy, thoroughly decompressing the spinal canal and bilateral neuroforamena.  After preparing the endplates, the appropriate-sized allograft intervertebral spacer was tamped into position.  The Caspar pins then were removed and bone wax was placed in their place.  The appropriate-sized anterior cervical plate was placed over the anterior spine.  14 mm variable angle screws were placed, 2 in each vertebral body from C5-C7 for a total of 6 vertebral body screws.  The screws were then locked to the plate using the Cam locking mechanism.  I was very pleased with the final fluoroscopic images.  The wound was then irrigated.  The wound was then explored for any undue bleeding and there was no bleeding  noted. The wound was then closed in layers using 2-0 Vicryl, followed by 4-0 Monocryl.  Benzoin and Steri-Strips were applied, followed by sterile dressing.  All instrument counts were correct at the termination of the procedure.   Of note, Geraline Knapp, PA-C, was my assistant throughout surgery, and did aid in  retraction, suctioning, placement of the hardware, and closure from start to finish.      Virl Grimes, MD

## 2024-01-16 NOTE — Progress Notes (Signed)
 Specialty Pharmacy Refill Coordination Note  Adrienne Campbell is a 63 y.o. female contacted today regarding refills of specialty medication(s) Adalimumab  (Humira  (2 Syringe))   Patient requested [name] at [facility] at [place] date: 01/31/24   Medication will be filled on 01/30/24.

## 2024-01-16 NOTE — Anesthesia Procedure Notes (Signed)
 Procedure Name: Intubation Date/Time: 01/16/2024 11:26 AM  Performed by: Trenton Frock, CRNAPre-anesthesia Checklist: Patient identified, Emergency Drugs available, Suction available and Patient being monitored Patient Re-evaluated:Patient Re-evaluated prior to induction Oxygen Delivery Method: Circle system utilized Preoxygenation: Pre-oxygenation with 100% oxygen Induction Type: IV induction Ventilation: Mask ventilation without difficulty Laryngoscope Size: Mac and 3 Grade View: Grade I Tube type: Oral Tube size: 7.0 mm Number of attempts: 1 Airway Equipment and Method: Stylet Placement Confirmation: ETT inserted through vocal cords under direct vision, positive ETCO2 and breath sounds checked- equal and bilateral Secured at: 21 cm Tube secured with: Tape Dental Injury: Teeth and Oropharynx as per pre-operative assessment

## 2024-01-16 NOTE — H&P (Signed)
 PREOPERATIVE H&P  Chief Complaint: Right arm pain and weakness  HPI: Adrienne Campbell is a 63 y.o. female who presents with ongoing pain and weakness in the right arm  MRI reveals severe right-sided neuroforaminal stenosis at C5-6 and C6-7  Patient has failed multiple forms of conservative care and continues to have pain (see office notes for additional details regarding the patient's full course of treatment)  Past Medical History:  Diagnosis Date   Ankylosing spondylitis (HCC)    [id number] positive (Dr. Stefan Edge)   Arthritis    Depression    Environmental and seasonal allergies    GERD (gastroesophageal reflux disease)    with LPR, laryngospasm   Heart murmur    during pregnancy   Hyperlipidemia    Hypertension    Insomnia    Pneumonia    hx of   PONV (postoperative nausea and vomiting)    Nausea, per pt. "felt loopy after hip surgery"   and made hallucinate   Sleep apnea    Pt unable to wear CPAP mask   Past Surgical History:  Procedure Laterality Date   ABDOMINAL HYSTERECTOMY  2007   COLONOSCOPY     HIP CLOSED REDUCTION Left 12/03/2021   Procedure: CLOSED REDUCTION HIP;  Surgeon: Sammye Cristal, MD;  Location: [facility] OR;  Service: Orthopedics;  Laterality: Left;   TOTAL HIP ARTHROPLASTY Left 06/28/2014   Procedure: LEFT TOTAL HIP ARTHROPLASTY;  Surgeon: Ilean Mall, MD;  Location: [facility] OR;  Service: Orthopedics;  Laterality: Left;   TOTAL HIP ARTHROPLASTY Right 04/27/2019   Procedure: Right Anterior Hip Arthroplasty;  Surgeon: Wendolyn Hamburger, MD;  Location: [facility];  Service: Orthopedics;  Laterality: Right;   TOTAL KNEE ARTHROPLASTY Right 08/11/2014   Procedure: RIGHT TOTAL KNEE ARTHROPLASTY;  Surgeon: Ilean Mall, MD;  Location: [facility] OR;  Service: Orthopedics;  Laterality: Right;   TRANSFORAMINAL LUMBAR INTERBODY FUSION (TLIF) WITH PEDICLE SCREW FIXATION 1 LEVEL Right 11/12/2018   Procedure: RIGHT-SIDED LUMBAR 4-5 TRANSFORAMINAL LUMBAR INTERBODY FUSION  WITH INSTRUMENTATION AND ALLOGRAFT;  Surgeon: Virl Grimes, MD;  Location: [facility] OR;  Service: Orthopedics;  Laterality: Right;   Social History   Socioeconomic History   Marital status: Married    Spouse name: Not on file   Number of children: Not on file   Years of education: Not on file   Highest education level: Not on file  Occupational History   Not on file  Tobacco Use   Smoking status: Never   Smokeless tobacco: Never  Vaping Use   Vaping status: Never Used  Substance and Sexual Activity   Alcohol  use: Yes    Comment: rarely   Drug use: No   Sexual activity: Yes  Other Topics Concern   Not on file  Social History Narrative   Not on file   Social Drivers of Health   Financial Resource Strain: Not on file  Food Insecurity: Not on file  Transportation Needs: Not on file  Physical Activity: Not on file  Stress: Not on file  Social Connections: Not on file   Family History  Problem Relation Age of Onset   Stroke Mother    Heart attack Mother    Coronary artery disease Father    Heart attack Brother    Heart attack Maternal Grandfather    Allergies  Allergen Reactions   Nsaids Hives    Specifically meloxicam and ibuprofen reported   Onion Swelling    SWELLING REACTION UNSPECIFIED    Sulfa  Antibiotics Swelling and Anaphylaxis    Patient reports tongue swells   Fluticasone Other (See Comments)    ulcers   Oxycodone  Nausea Only    Severe nausea; pt has to take with Zofran    Tramadol Hives   Hydrocodone-Acetaminophen  Other (See Comments)    Hallucinations   Ibuprofen     Other reaction(s): hives/itching   Meloxicam     Other reaction(s): pruritus   Rosuvastatin  Calcium      Other reaction(s): cramps   Tizanidine  Hcl     Other reaction(s): Hallucinations   Codeine Nausea And Vomiting   Prior to Admission medications   Medication Sig Start Date End Date Taking? Authorizing Provider  acetaminophen  (TYLENOL ) 650 MG CR tablet 1,300 mg daily as needed for  pain.   Yes [provider]  acyclovir  (ZOVIRAX ) 200 MG capsule Take 2 capsules (400 mg total) by mouth 3 (three) times daily for 5 days for recurrence. 12/26/23  Yes   adalimumab  (HUMIRA , 2 SYRINGE,) 40 MG/0.8ML prefilled syringe Inject 0.8 mL Subcutaneous Once a week 30 days 10/31/23  Yes Jegede, Olugbemiga E, MD  alclomethasone (ACLOVATE ) 0.05 % cream Apply topically to the affected areas up to 2 times a day as needed 08/03/21  Yes   celecoxib  (CELEBREX ) 200 MG capsule Take 1 capsule (200 mg total) by mouth 2 (two) times daily. 10/14/23  Yes   cholecalciferol  (VITAMIN D ) 25 MCG (1000 UT) tablet Take 1,000 Units by mouth daily.   Yes [provider]  CITRUS BERGAMOT PO Take 1 tablet by mouth every morning.    Yes [provider]  Cyanocobalamin  (VITAMIN B-12) 5000 MCG TBDP Take 5,000 mcg by mouth daily.   Yes [provider]  cyclobenzaprine  (FLEXERIL ) 10 MG tablet Take 1 tablet (10 mg total) by mouth daily. 10/14/23  Yes   Dapsone  5 % topical gel Apply 1 application topically daily as needed. 02/26/19  Yes [provider]  estradiol  (ESTRACE ) 0.1 MG/GM vaginal cream Apply  1/2 to 1 gram at the vaginal introitus at bedtime twice a week as directed. 04/19/23  Yes   Estradiol  (VAGIFEM ) 10 MCG TABS vaginal tablet Insert 1 tablet vaginally 2 times a week 04/19/23  Yes   Evolocumab  (REPATHA  SURECLICK) 140 MG/ML SOAJ Inject 140 mg into the skin every 14 (fourteen) days. 04/18/23  Yes Hilty, Aviva Lemmings, MD  ezetimibe  (ZETIA ) 10 MG tablet Take 1 tablet by mouth daily. 02/11/23 02/20/24 Yes Hilty, Aviva Lemmings, MD  hydrOXYzine  (VISTARIL ) 25 MG capsule Take 1 capsule (25 mg total) by mouth at bedtime. 12/26/23  Yes   lisinopril -hydrochlorothiazide  (ZESTORETIC ) 10-12.5 MG tablet Take 1 tablet by mouth daily. 12/26/23  Yes   Meth-Hyo-M Bl-Benz Acd-Ph Sal (URIBEL ) 81.6 MG TABS Take 1 tablet (81.6 mg total) by mouth every 6 (six) hours as needed. 07/31/23  Yes   mupirocin  ointment  (BACTROBAN ) 2 % Apply to affected area twice daily for 5 days 06/25/23  Yes   Omega-3 1000 MG CAPS Take 1,000 mg by mouth at bedtime.   Yes [provider]  omeprazole  (PRILOSEC) 40 MG capsule Take 1 capsule (40 mg) by mouth once daily 30 minutes before morning meal 11/04/23  Yes   Polyethyl Glycol-Propyl Glycol 0.4-0.3 % SOLN Place 1 drop into both eyes 4 (four) times daily as needed (dry/irritated eyes).   Yes [provider]  venlafaxine  XR (EFFEXOR -XR) 150 MG 24 hr capsule Take 1 capsule (150 mg total) by mouth daily with food. 12/26/23  Yes   vitamin  E 400 UNIT capsule Take 400 Units by mouth at bedtime.    Yes [provider]  cetirizine  (ZYRTEC ) 10 MG tablet Take 1 tablet (10 mg total) by mouth daily. Patient not taking: Reported on 01/07/2024 07/01/23   Soldatova, [place], MD  diazepam  (VALIUM ) 10 MG tablet Insert 1 tablet (10 mg total)  vaginally every 12 (twelve) hours as needed. 07/31/23        All other systems have been reviewed and were otherwise negative with the exception of those mentioned in the HPI and as above.  Physical Exam: Vitals:   01/16/24 0825  BP: 139/78  Pulse: 92  Resp: 18  Temp: 98.2 F (36.8 C)  SpO2: 94%    Body mass index is 25.15 kg/m.  General: Alert, no acute distress Cardiovascular: No pedal edema Respiratory: No cyanosis, no use of accessory musculature Skin: No lesions in the area of chief complaint Neurologic: Sensation intact distally Psychiatric: Patient is competent for consent with normal mood and affect Lymphatic: No axillary or cervical lymphadenopathy   Assessment/Plan: RIGHT-SIDED CERVICAL RADICULOPATHY Plan for Procedure(s): ANTERIOR CERVICAL DECOMPRESSION/DISCECTOMY FUSION, C5/6 AND C6/7   Adrienne Arlington, MD 01/16/2024 10:16 AM

## 2024-01-16 NOTE — Transfer of Care (Signed)
 Immediate Anesthesia Transfer of Care Note  Patient: Adrienne Campbell  Procedure(s) Performed: ANTERIOR CERVICAL DECOMPRESSION/DISCECTOMY FUSION 2 LEVELS (Neck)  Patient Location: PACU  Anesthesia Type:General  Level of Consciousness: awake, oriented, and patient cooperative  Airway & Oxygen Therapy: Patient Spontanous Breathing and Patient connected to nasal cannula oxygen  Post-op Assessment: Report given to RN, Post -op Vital signs reviewed and stable, Patient moving all extremities X 4, and Patient able to stick tongue midline  Post vital signs: Reviewed and stable  Last Vitals:  Vitals Value Taken Time  BP 139/82 1356  Temp 97.7   Pulse 82   Resp 14   SpO2 94     Last Pain:  Vitals:   01/16/24 0846  TempSrc:   PainSc: 2       Patients Stated Pain Goal: 3 (01/16/24 0846)  Complications: No notable events documented.

## 2024-01-17 MED FILL — Thrombin (Recombinant) For Soln 20000 Unit: CUTANEOUS | Qty: 1 | Status: AC

## 2024-01-17 NOTE — Anesthesia Postprocedure Evaluation (Signed)
 Anesthesia Post Note  Patient: Andelyn Magallanez  Procedure(s) Performed: ANTERIOR CERVICAL DECOMPRESSION/DISCECTOMY FUSION 2 LEVELS (Neck)     Patient location during evaluation: PACU Anesthesia Type: General Level of consciousness: awake and alert Pain management: pain level controlled Vital Signs Assessment: post-procedure vital signs reviewed and stable Respiratory status: spontaneous breathing, nonlabored ventilation, respiratory function stable and patient connected to nasal cannula oxygen Cardiovascular status: blood pressure returned to baseline and stable Postop Assessment: no apparent nausea or vomiting Anesthetic complications: no  No notable events documented.  Last Vitals:  Vitals:   01/16/24 1430 01/16/24 1445  BP: 131/85 137/76  Pulse: 75 84  Resp: 13 14  Temp:  36.6 C  SpO2: 95% 94%    Last Pain:  Vitals:   01/16/24 1436  TempSrc:   PainSc: 4                  Natoya Viscomi L Viha Kriegel

## 2024-01-20 ENCOUNTER — Encounter (HOSPITAL_COMMUNITY): Payer: Self-pay | Admitting: Orthopedic Surgery

## 2024-01-21 DIAGNOSIS — L820 Inflamed seborrheic keratosis: Secondary | ICD-10-CM | POA: Diagnosis not present

## 2024-01-28 DIAGNOSIS — M542 Cervicalgia: Secondary | ICD-10-CM | POA: Diagnosis not present

## 2024-01-30 ENCOUNTER — Other Ambulatory Visit: Payer: Self-pay

## 2024-02-03 ENCOUNTER — Other Ambulatory Visit (HOSPITAL_COMMUNITY): Payer: Self-pay

## 2024-02-08 ENCOUNTER — Other Ambulatory Visit (HOSPITAL_COMMUNITY): Payer: Self-pay

## 2024-02-08 ENCOUNTER — Other Ambulatory Visit: Payer: Self-pay | Admitting: Internal Medicine

## 2024-02-10 ENCOUNTER — Other Ambulatory Visit (HOSPITAL_COMMUNITY): Payer: Self-pay

## 2024-02-10 ENCOUNTER — Other Ambulatory Visit: Payer: Self-pay

## 2024-02-10 MED ORDER — CYCLOBENZAPRINE HCL 10 MG PO TABS
10.0000 mg | ORAL_TABLET | Freq: Every day | ORAL | 1 refills | Status: DC
  Filled 2024-02-10: qty 30, 30d supply, fill #0
  Filled 2024-03-09: qty 30, 30d supply, fill #1

## 2024-02-10 MED ORDER — CELECOXIB 200 MG PO CAPS
200.0000 mg | ORAL_CAPSULE | Freq: Two times a day (BID) | ORAL | 0 refills | Status: DC
  Filled 2024-02-10: qty 180, 90d supply, fill #0

## 2024-02-10 MED ORDER — EZETIMIBE 10 MG PO TABS
10.0000 mg | ORAL_TABLET | Freq: Every day | ORAL | 3 refills | Status: AC
  Filled 2024-02-10: qty 90, 90d supply, fill #0
  Filled 2024-06-01: qty 90, 90d supply, fill #1
  Filled 2024-09-15: qty 90, 90d supply, fill #2

## 2024-02-18 ENCOUNTER — Other Ambulatory Visit: Payer: Self-pay

## 2024-02-18 ENCOUNTER — Other Ambulatory Visit (HOSPITAL_COMMUNITY): Payer: Self-pay

## 2024-02-19 ENCOUNTER — Other Ambulatory Visit: Payer: Self-pay

## 2024-02-19 ENCOUNTER — Other Ambulatory Visit (HOSPITAL_COMMUNITY): Payer: Self-pay

## 2024-02-20 ENCOUNTER — Other Ambulatory Visit (HOSPITAL_COMMUNITY): Payer: Self-pay

## 2024-02-21 ENCOUNTER — Other Ambulatory Visit (HOSPITAL_COMMUNITY): Payer: Self-pay

## 2024-02-21 MED ORDER — METHOCARBAMOL 500 MG PO TABS
500.0000 mg | ORAL_TABLET | ORAL | 2 refills | Status: DC
  Filled 2024-02-21: qty 60, 8d supply, fill #0
  Filled 2024-02-28: qty 60, 8d supply, fill #1
  Filled 2024-03-19: qty 60, 8d supply, fill #2

## 2024-02-22 ENCOUNTER — Other Ambulatory Visit (HOSPITAL_COMMUNITY): Payer: Self-pay

## 2024-02-24 DIAGNOSIS — Z9889 Other specified postprocedural states: Secondary | ICD-10-CM | POA: Diagnosis not present

## 2024-02-24 DIAGNOSIS — M542 Cervicalgia: Secondary | ICD-10-CM | POA: Diagnosis not present

## 2024-02-25 ENCOUNTER — Other Ambulatory Visit: Payer: Self-pay

## 2024-02-25 ENCOUNTER — Other Ambulatory Visit (HOSPITAL_COMMUNITY): Payer: Self-pay

## 2024-02-26 ENCOUNTER — Other Ambulatory Visit (HOSPITAL_COMMUNITY): Payer: Self-pay

## 2024-02-28 ENCOUNTER — Other Ambulatory Visit (HOSPITAL_COMMUNITY): Payer: Self-pay

## 2024-02-28 ENCOUNTER — Other Ambulatory Visit: Payer: Self-pay

## 2024-03-02 DIAGNOSIS — M4322 Fusion of spine, cervical region: Secondary | ICD-10-CM | POA: Diagnosis not present

## 2024-03-02 DIAGNOSIS — M48061 Spinal stenosis, lumbar region without neurogenic claudication: Secondary | ICD-10-CM | POA: Diagnosis not present

## 2024-03-02 DIAGNOSIS — M4326 Fusion of spine, lumbar region: Secondary | ICD-10-CM | POA: Diagnosis not present

## 2024-03-03 ENCOUNTER — Other Ambulatory Visit (HOSPITAL_COMMUNITY): Payer: Self-pay

## 2024-03-09 ENCOUNTER — Other Ambulatory Visit (HOSPITAL_COMMUNITY): Payer: Self-pay

## 2024-03-10 ENCOUNTER — Other Ambulatory Visit (HOSPITAL_COMMUNITY): Payer: Self-pay

## 2024-03-10 DIAGNOSIS — M4322 Fusion of spine, cervical region: Secondary | ICD-10-CM | POA: Diagnosis not present

## 2024-03-10 MED ORDER — ESTRADIOL 10 MCG VA TABS
10.0000 ug | ORAL_TABLET | VAGINAL | 3 refills | Status: AC
  Filled 2024-03-10 – 2024-03-19 (×2): qty 24, 84d supply, fill #0

## 2024-03-13 DIAGNOSIS — M4326 Fusion of spine, lumbar region: Secondary | ICD-10-CM | POA: Diagnosis not present

## 2024-03-13 DIAGNOSIS — M48061 Spinal stenosis, lumbar region without neurogenic claudication: Secondary | ICD-10-CM | POA: Diagnosis not present

## 2024-03-16 DIAGNOSIS — M4322 Fusion of spine, cervical region: Secondary | ICD-10-CM | POA: Diagnosis not present

## 2024-03-17 ENCOUNTER — Ambulatory Visit: Attending: Internal Medicine | Admitting: Internal Medicine

## 2024-03-17 ENCOUNTER — Encounter: Payer: Self-pay | Admitting: Internal Medicine

## 2024-03-17 VITALS — BP 112/74 | HR 96 | Ht 64.0 in | Wt 143.0 lb

## 2024-03-17 DIAGNOSIS — T466X5D Adverse effect of antihyperlipidemic and antiarteriosclerotic drugs, subsequent encounter: Secondary | ICD-10-CM

## 2024-03-17 DIAGNOSIS — E7849 Other hyperlipidemia: Secondary | ICD-10-CM | POA: Diagnosis not present

## 2024-03-17 DIAGNOSIS — R931 Abnormal findings on diagnostic imaging of heart and coronary circulation: Secondary | ICD-10-CM

## 2024-03-17 DIAGNOSIS — E781 Pure hyperglyceridemia: Secondary | ICD-10-CM

## 2024-03-17 DIAGNOSIS — M791 Myalgia, unspecified site: Secondary | ICD-10-CM | POA: Diagnosis not present

## 2024-03-17 NOTE — Progress Notes (Signed)
 LIPID CLINIC CONSULT NOTE  Chief Complaint:  Follow-up dyslipidemia  Primary Care Physician: Regino Slater, MD  Primary Cardiologist:  Vinie JAYSON Maxcy, MD  HPI:  Adrienne Campbell is a 63 y.o. female who is being seen today for the evaluation of dyslipidemia at the request of Koirala, Dibas, MD. 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  She has past medical history significant for dyslipidemia and has been intolerant to statins.  Most recent lipids show significantly elevated cholesterol with total 361, triglycerides 404, HDL 54 and LDL 221.  She also reports family history of coronary disease including her mom who had an MI and a stroke in her father who had two-vessel bypass.  Her paternal grandfather also died of an MI at an earlier age and her brother had an MI believe in his 39s.  Based on this there is a strong suspicion for familial hyperlipidemia.  According to her primary care provider she had previously been on rosuvastatin  at various doses and atorvastatin including high intensity doses and was not able to tolerate the medications due to myalgias/severe leg cramps.  She reports a healthy diet that is pescatarian and tried to avoid fats.  03/07/2022  Adrienne Campbell returns today for follow-up.  She has had a marked improvement in her LDL particle numbers.  LDL-P is now 1223, LDL-C of 95 (down from 221), HDL is 50 and triglycerides are 303.  Her LP(a) was elevated at 139.  Her calcium  score did come back abnormal with a calcium  score 78.2, 86th percentile for age and sex matched controls.  I discussed the meaning of this with her today in the office.  Overall I am pleased with her lipid lowering however I think we have reached the maximal benefit of that medication.  She still remains above ideal targets.  03/08/2023  Adrienne Campbell is seen today for follow-up.  She seems to be doing well with the addition of  ezetimibe .  She remains on Repatha .  She has had additional lipid lowering with this combination.  Her LDL particle number is now 891 down from 1223, LDL is 74, down from 95, HDL 46 and triglycerides 236 again improved from earlier measurements.  Overall I think this is a good combination of medications for her although her lipids are still slightly elevated.  03/17/2024  Adrienne Campbell returns today for follow-up.  She seems to be doing fairly well.  She has remained on Repatha  and ezetimibe .  Recent lipids in April showed total cholesterol 164, HDL 50, triglycerides 266 and LDL 71.  She is on some over-the-counter fish oil.  We discussed the possibility of switching her to Vascepa which does show some cardiovascular risk reduction data.  She tells me that she may be moving to [place] soon once she retires.  PMHx:  Past Medical History:  Diagnosis Date   Ankylosing spondylitis (HCC)    HLAl-B27 positive (Dr. Jon Jacob)   Arthritis    Depression    Environmental and seasonal allergies    GERD (gastroesophageal reflux disease)    with LPR, laryngospasm   Heart murmur    during pregnancy   Hyperlipidemia    Hypertension    Insomnia    Pneumonia    hx of   PONV (postoperative nausea and vomiting)    Nausea, per pt. felt loopy after hip surgery   and made hallucinate   Sleep apnea    Pt unable to  wear CPAP mask    Past Surgical History:  Procedure Laterality Date   ABDOMINAL HYSTERECTOMY  2007   ANTERIOR CERVICAL DECOMP/DISCECTOMY FUSION N/A 01/16/2024   Procedure: ANTERIOR CERVICAL DECOMPRESSION/DISCECTOMY FUSION 2 LEVELS;  Surgeon: Beuford Anes, MD;  Location: [facility] OR;  Service: Orthopedics;  Laterality: N/A;  ANTERIOR CERVICAL DECOMPRESSION FUSION CERVICAL 5- CERVICAL 6, CERVICAL 6- CERVICAL 7 WITH INSTRUMENTATION AND ALLOGRAFT   COLONOSCOPY     HIP CLOSED REDUCTION Left 12/03/2021   Procedure: CLOSED REDUCTION HIP;  Surgeon: Dozier Soulier, MD;  Location: [facility] OR;  Service:  Orthopedics;  Laterality: Left;   TOTAL HIP ARTHROPLASTY Left 06/28/2014   Procedure: LEFT TOTAL HIP ARTHROPLASTY;  Surgeon: Dempsey JINNY Sensor, MD;  Location: [facility] OR;  Service: Orthopedics;  Laterality: Left;   TOTAL HIP ARTHROPLASTY Right 04/27/2019   Procedure: Right Anterior Hip Arthroplasty;  Surgeon: Sensor Dempsey, MD;  Location: [facility];  Service: Orthopedics;  Laterality: Right;   TOTAL KNEE ARTHROPLASTY Right 08/11/2014   Procedure: RIGHT TOTAL KNEE ARTHROPLASTY;  Surgeon: Dempsey JINNY Sensor, MD;  Location: [facility];  Service: Orthopedics;  Laterality: Right;   TRANSFORAMINAL LUMBAR INTERBODY FUSION (TLIF) WITH PEDICLE SCREW FIXATION 1 LEVEL Right 11/12/2018   Procedure: RIGHT-SIDED LUMBAR 4-5 TRANSFORAMINAL LUMBAR INTERBODY FUSION WITH INSTRUMENTATION AND ALLOGRAFT;  Surgeon: Beuford Anes, MD;  Location: [facility];  Service: Orthopedics;  Laterality: Right;    FAMHx:  Family History  Problem Relation Age of Onset   Stroke Mother    Heart attack Mother    Coronary artery disease Father    Heart attack Brother    Heart attack Maternal Grandfather     SOCHx:   reports that she has never smoked. She has never used smokeless tobacco. She reports current alcohol  use. She reports that she does not use drugs.  ALLERGIES:  Allergies  Allergen Reactions   Nsaids Hives    Specifically meloxicam and ibuprofen reported   Onion Swelling    SWELLING REACTION UNSPECIFIED    Sulfa Antibiotics Swelling and Anaphylaxis    Patient reports tongue swells   Fluticasone Other (See Comments)    ulcers   Oxycodone  Nausea Only    Severe nausea; pt has to take with Zofran    Tramadol Hives   Hydrocodone -Acetaminophen  Other (See Comments)    Hallucinations   Ibuprofen     Other reaction(s): hives/itching   Meloxicam     Other reaction(s): pruritus   Rosuvastatin  Calcium      Other reaction(s): cramps   Tizanidine  Hcl     Other reaction(s): Hallucinations   Codeine Nausea And Vomiting    ROS: Pertinent  items noted in HPI and remainder of comprehensive ROS otherwise negative.  HOME MEDS: Current Outpatient Medications on File Prior to Visit  Medication Sig Dispense Refill   acyclovir  (ZOVIRAX ) 200 MG capsule Take 2 capsules (400 mg total) by mouth 3 (three) times daily for 5 days for recurrence. 360 capsule 1   adalimumab  (HUMIRA , 2 SYRINGE,) 40 MG/0.8ML prefilled syringe Inject 0.8 mL Subcutaneous Once a week 30 days 4 each 5   alclomethasone (ACLOVATE ) 0.05 % cream Apply topically to the affected areas up to 2 times a day as needed 60 g 3   celecoxib  (CELEBREX ) 200 MG capsule Take 1 capsule (200 mg total) by mouth 2 (two) times daily. 180 capsule 0   cetirizine  (ZYRTEC ) 10 MG tablet Take 1 tablet (10 mg total) by mouth daily. 30 tablet 11   cholecalciferol  (VITAMIN D ) 25 MCG (1000 UT) tablet Take  1,000 Units by mouth daily.     CITRUS BERGAMOT PO Take 1 tablet by mouth every morning.      Cyanocobalamin  (VITAMIN B-12) 5000 MCG TBDP Take 5,000 mcg by mouth daily.     cyclobenzaprine  (FLEXERIL ) 10 MG tablet Take 1 tablet (10 mg) Orally Once a day 30 days 30 tablet 1   Dapsone  5 % topical gel Apply 1 application topically daily as needed.     diazepam  (VALIUM ) 10 MG tablet Insert 1 tablet (10 mg total)  vaginally every 12 (twelve) hours as needed. 20 tablet 1   estradiol  (ESTRACE ) 0.1 MG/GM vaginal cream Apply  1/2 to 1 gram at the vaginal introitus at bedtime twice a week as directed. 42.5 g 3   Estradiol  (VAGIFEM ) 10 MCG TABS vaginal tablet Place 1 tablet (10 mcg total) vaginally 2 (two) times a week. 24 tablet 3   Evolocumab  (REPATHA  SURECLICK) 140 MG/ML SOAJ Inject 140 mg into the skin every 14 (fourteen) days. 6 mL 3   ezetimibe  (ZETIA ) 10 MG tablet Take 1 tablet by mouth daily. 90 tablet 3   HYDROcodone -acetaminophen  (NORCO/VICODIN) 5-325 MG tablet Take 1 tablet by mouth every 6 (six) hours as needed for moderate pain (pain score 4-6) or severe pain (pain score 7-10). 20 tablet 0    hydrOXYzine  (VISTARIL ) 25 MG capsule Take 1 capsule (25 mg total) by mouth at bedtime. 90 capsule 3   lisinopril -hydrochlorothiazide  (ZESTORETIC ) 10-12.5 MG tablet Take 1 tablet by mouth daily. 90 tablet 3   Meth-Hyo-M Bl-Benz Acd-Ph Sal (URIBEL ) 81.6 MG TABS Take 1 tablet (81.6 mg total) by mouth every 6 (six) hours as needed. 30 tablet 5   methocarbamol  (ROBAXIN ) 500 MG tablet Take 1-2 tablets (500-1,000 mg total) by mouth 4 (four) times daily. 60 tablet 0   methocarbamol  (ROBAXIN ) 500 MG tablet Take 1-2 tablet by mouth every six to eight hours as needed for spasms/ muscle tension. 60 tablet 2   mupirocin  ointment (BACTROBAN ) 2 % Apply to affected area twice daily for 5 days 22 g 0   omeprazole  (PRILOSEC) 40 MG capsule Take 1 capsule (40 mg) by mouth once daily 30 minutes before morning meal 90 capsule 1   Polyethyl Glycol-Propyl Glycol 0.4-0.3 % SOLN Place 1 drop into both eyes 4 (four) times daily as needed (dry/irritated eyes).     venlafaxine  XR (EFFEXOR -XR) 150 MG 24 hr capsule Take 1 capsule (150 mg total) by mouth daily with food. 90 capsule 3   vitamin E  400 UNIT capsule Take 400 Units by mouth at bedtime.      No current facility-administered medications on file prior to visit.    LABS/IMAGING: No results found for this or any previous visit (from the past 48 hours). No results found.  LIPID PANEL: No results found for: CHOL, TRIG, HDL, CHOLHDL, VLDL, LDLCALC, LDLDIRECT  WEIGHTS: Wt Readings from Last 3 Encounters:  03/17/24 143 lb (64.9 kg)  01/16/24 142 lb (64.4 kg)  01/07/24 146 lb 12.8 oz (66.6 kg)    VITALS: BP 112/74   Pulse 96   Ht 5' 4 (1.626 m)   Wt 143 lb (64.9 kg)   SpO2 96%   BMI 24.55 kg/m   EXAM: Deferred  EKG: N/A  ASSESSMENT: Probable familial hyperlipidemia by Simon-Broome criteria, LDL greater than 190 Family history of multiple family members with premature coronary disease and high cholesterol Statin  intolerance-myalgias/cramps CAC score of 78.2, 86 percentile for age and sex matched controls disease (11/2021)  PLAN: 1.  Adrienne Campbell continues to have good treatment of her dyslipidemia.  Her LDL is close to 70 on combination therapy with Repatha  and ezetimibe .  Triglycerides remain elevated.  She might benefit from Vascepa 2 g twice daily.  There is cardiovascular risk reduction associated with this since she has coronary artery calcification.  Will go ahead and reach out for prior authorization to take this in place of her current over-the-counter fish oil.  Plan repeat lipids otherwise in 3 to 4 months and follow-up with me annually or sooner as necessary.  Vinie KYM Maxcy, MD, [facility], FNLA, FACP    [facility]  Medical Director of the Advanced Lipid Disorders &  Cardiovascular Risk Reduction Clinic Diplomate of the American Board of Clinical Lipidology Attending Cardiologist  Direct Dial: [phone]  Fax: [phone]  Website:  [email or web address] 03/17/2024, 4:54 PM

## 2024-03-17 NOTE — Patient Instructions (Signed)
 Medication Instructions:  START Vascepa 2 g twice daily (4g total daily) *If you need a refill on your cardiac medications before your next appointment, please call your pharmacy*  Lab Work: FASTING Lipid Panel- 3 months (after starting Vascepa- around Oct/Nov) If you have labs (blood work) drawn today and your tests are completely normal, you will receive your results only by: MyChart Message (if you have MyChart) OR A paper copy in the mail If you have any lab test that is abnormal or we need to change your treatment, we will call you to review the results.  Follow-Up: At [facility], you and your health needs are our priority.  As part of our continuing mission to provide you with exceptional heart care, our providers are all part of one team.  This team includes your primary Cardiologist (physician) and Advanced Practice Providers or APPs (Physician Assistants and Nurse Practitioners) who all work together to provide you with the care you need, when you need it.  Your next appointment:   1 year with Dr. Mona

## 2024-03-18 ENCOUNTER — Other Ambulatory Visit (HOSPITAL_COMMUNITY): Payer: Self-pay

## 2024-03-18 ENCOUNTER — Telehealth: Payer: Self-pay | Admitting: Pharmacy Technician

## 2024-03-18 DIAGNOSIS — M48061 Spinal stenosis, lumbar region without neurogenic claudication: Secondary | ICD-10-CM | POA: Diagnosis not present

## 2024-03-18 DIAGNOSIS — M4326 Fusion of spine, lumbar region: Secondary | ICD-10-CM | POA: Diagnosis not present

## 2024-03-18 NOTE — Telephone Encounter (Signed)
 Staff messages   Insurance said they won't pay for generic but they will pay for brand as generic copay. Per test claim cost is 5.00 for brand vascepa

## 2024-03-19 ENCOUNTER — Other Ambulatory Visit: Payer: Self-pay

## 2024-03-19 ENCOUNTER — Other Ambulatory Visit (HOSPITAL_COMMUNITY): Payer: Self-pay

## 2024-03-19 ENCOUNTER — Other Ambulatory Visit: Payer: Self-pay | Admitting: Internal Medicine

## 2024-03-19 DIAGNOSIS — E7800 Pure hypercholesterolemia, unspecified: Secondary | ICD-10-CM

## 2024-03-19 DIAGNOSIS — E7849 Other hyperlipidemia: Secondary | ICD-10-CM

## 2024-03-19 MED ORDER — REPATHA SURECLICK 140 MG/ML ~~LOC~~ SOAJ
140.0000 mg | SUBCUTANEOUS | 3 refills | Status: AC
  Filled 2024-03-19: qty 6, 84d supply, fill #0
  Filled 2024-06-15: qty 6, 84d supply, fill #1
  Filled 2024-08-18 – 2024-08-21 (×2): qty 6, 84d supply, fill #2

## 2024-03-19 NOTE — Progress Notes (Signed)
 Specialty Pharmacy Refill Coordination Note  Adrienne Campbell is a 63 y.o. female contacted today regarding refills of specialty medication(s) Adalimumab  (Humira  (2 Syringe))   Patient requested [name] at [facility] at [place] date: 03/20/24   Medication will be filled on 03/19/24.

## 2024-03-20 ENCOUNTER — Other Ambulatory Visit: Payer: Self-pay

## 2024-03-20 ENCOUNTER — Other Ambulatory Visit (HOSPITAL_COMMUNITY): Payer: Self-pay

## 2024-03-20 DIAGNOSIS — M4322 Fusion of spine, cervical region: Secondary | ICD-10-CM | POA: Diagnosis not present

## 2024-03-23 DIAGNOSIS — M4326 Fusion of spine, lumbar region: Secondary | ICD-10-CM | POA: Diagnosis not present

## 2024-03-23 DIAGNOSIS — M48061 Spinal stenosis, lumbar region without neurogenic claudication: Secondary | ICD-10-CM | POA: Diagnosis not present

## 2024-03-25 DIAGNOSIS — M4322 Fusion of spine, cervical region: Secondary | ICD-10-CM | POA: Diagnosis not present

## 2024-03-27 ENCOUNTER — Other Ambulatory Visit: Payer: Self-pay

## 2024-03-27 ENCOUNTER — Other Ambulatory Visit (HOSPITAL_COMMUNITY): Payer: Self-pay

## 2024-03-27 ENCOUNTER — Encounter: Payer: Self-pay | Admitting: Internal Medicine

## 2024-03-27 DIAGNOSIS — M4326 Fusion of spine, lumbar region: Secondary | ICD-10-CM | POA: Diagnosis not present

## 2024-03-27 DIAGNOSIS — M48061 Spinal stenosis, lumbar region without neurogenic claudication: Secondary | ICD-10-CM | POA: Diagnosis not present

## 2024-03-27 MED ORDER — ICOSAPENT ETHYL 1 G PO CAPS
2.0000 g | ORAL_CAPSULE | Freq: Two times a day (BID) | ORAL | 3 refills | Status: AC
  Filled 2024-03-27: qty 360, 90d supply, fill #0

## 2024-03-30 DIAGNOSIS — R5383 Other fatigue: Secondary | ICD-10-CM | POA: Diagnosis not present

## 2024-03-30 DIAGNOSIS — Z1589 Genetic susceptibility to other disease: Secondary | ICD-10-CM | POA: Diagnosis not present

## 2024-03-30 DIAGNOSIS — Z6824 Body mass index (BMI) 24.0-24.9, adult: Secondary | ICD-10-CM | POA: Diagnosis not present

## 2024-03-30 DIAGNOSIS — Z79899 Other long term (current) drug therapy: Secondary | ICD-10-CM | POA: Diagnosis not present

## 2024-03-30 DIAGNOSIS — M1991 Primary osteoarthritis, unspecified site: Secondary | ICD-10-CM | POA: Diagnosis not present

## 2024-03-30 DIAGNOSIS — M4322 Fusion of spine, cervical region: Secondary | ICD-10-CM | POA: Diagnosis not present

## 2024-03-30 DIAGNOSIS — M459 Ankylosing spondylitis of unspecified sites in spine: Secondary | ICD-10-CM | POA: Diagnosis not present

## 2024-03-30 DIAGNOSIS — M545 Low back pain, unspecified: Secondary | ICD-10-CM | POA: Diagnosis not present

## 2024-04-01 DIAGNOSIS — M4326 Fusion of spine, lumbar region: Secondary | ICD-10-CM | POA: Diagnosis not present

## 2024-04-01 DIAGNOSIS — M48061 Spinal stenosis, lumbar region without neurogenic claudication: Secondary | ICD-10-CM | POA: Diagnosis not present

## 2024-04-01 DIAGNOSIS — M25511 Pain in right shoulder: Secondary | ICD-10-CM | POA: Diagnosis not present

## 2024-04-03 DIAGNOSIS — M4326 Fusion of spine, lumbar region: Secondary | ICD-10-CM | POA: Diagnosis not present

## 2024-04-03 DIAGNOSIS — M48061 Spinal stenosis, lumbar region without neurogenic claudication: Secondary | ICD-10-CM | POA: Diagnosis not present

## 2024-04-06 ENCOUNTER — Other Ambulatory Visit (HOSPITAL_COMMUNITY): Payer: Self-pay

## 2024-04-06 DIAGNOSIS — M4322 Fusion of spine, cervical region: Secondary | ICD-10-CM | POA: Diagnosis not present

## 2024-04-07 ENCOUNTER — Other Ambulatory Visit (HOSPITAL_COMMUNITY): Payer: Self-pay

## 2024-04-07 MED ORDER — CYCLOBENZAPRINE HCL 10 MG PO TABS
10.0000 mg | ORAL_TABLET | Freq: Every day | ORAL | 3 refills | Status: DC
  Filled 2024-04-07: qty 30, 30d supply, fill #0
  Filled 2024-05-18: qty 30, 30d supply, fill #1
  Filled 2024-06-15: qty 30, 30d supply, fill #2
  Filled 2024-07-15: qty 30, 30d supply, fill #3

## 2024-04-09 DIAGNOSIS — M4322 Fusion of spine, cervical region: Secondary | ICD-10-CM | POA: Diagnosis not present

## 2024-04-10 ENCOUNTER — Other Ambulatory Visit: Payer: Self-pay

## 2024-04-10 ENCOUNTER — Other Ambulatory Visit (HOSPITAL_COMMUNITY): Payer: Self-pay

## 2024-04-13 DIAGNOSIS — M4322 Fusion of spine, cervical region: Secondary | ICD-10-CM | POA: Diagnosis not present

## 2024-04-14 ENCOUNTER — Other Ambulatory Visit: Payer: Self-pay

## 2024-04-14 ENCOUNTER — Telehealth: Payer: Self-pay | Admitting: Pharmacist

## 2024-04-14 ENCOUNTER — Ambulatory Visit: Attending: Family Medicine | Admitting: Pharmacist

## 2024-04-14 DIAGNOSIS — Z7189 Other specified counseling: Secondary | ICD-10-CM

## 2024-04-14 DIAGNOSIS — M459 Ankylosing spondylitis of unspecified sites in spine: Secondary | ICD-10-CM

## 2024-04-14 NOTE — Telephone Encounter (Signed)
 Called patient to schedule an appointment for the [facility] [employer] Specialty Medication Clinic. I was unable to reach the patient so I left a HIPAA-compliant message requesting that the patient return my call.   Butch Penny, PharmD, Patsy [place], CPP Clinical Pharmacist [facility] & [facility] [phone]

## 2024-04-14 NOTE — Telephone Encounter (Signed)
 Called patient to schedule an appointment for the [facility] [employer] Specialty Medication Clinic. I was unable to reach the patient so I left a HIPAA-compliant message requesting that the patient return my call.   Herlene Fleeta Morris, PharmD, JAQUELINE, CPP Clinical Pharmacist [facility] & [facility] [phone]

## 2024-04-14 NOTE — Progress Notes (Signed)
  S: Patient presents for review of their specialty medication therapy.  Patient is currently taking Humira  for ankylosing spondylitis. Patient is managed by Jon Jacob for this.   Adherence: denies any missed doses  Efficacy: working well for her  Dosing:  Ankylosing spondylitis: SubQ: 40 mg every week  Dose adjustments: Renal: no dose adjustments (has not been studied) Hepatic: no dose adjustments (has not been studied)  Screening: TB test: completed per patient; negative Hepatitis: completed per patient  Monitoring: S/sx of infection: denies CBC: followed by rheum, has been WNL S/sx of hypersensitivity: denies S/sx of malignancy: denies S/sx of heart failure: denies Recently underwent testing for Ab, and this was negative  O:      Lab Results  Component Value Date   WBC 10.8 (H) 01/07/2024   HGB 12.6 01/07/2024   HCT 37.7 01/07/2024   MCV 100.3 (H) 01/07/2024   PLT 390 01/07/2024      Chemistry      Component Value Date/Time   NA 133 (L) 01/07/2024 1330   K 4.4 01/07/2024 1330   CL 99 01/07/2024 1330   CO2 25 01/07/2024 1330   BUN 17 01/07/2024 1330   CREATININE 0.63 01/07/2024 1330      Component Value Date/Time   CALCIUM  9.2 01/07/2024 1330   ALKPHOS 70 11/07/2018 1329   AST 21 11/07/2018 1329   ALT 15 11/07/2018 1329   BILITOT 0.5 11/07/2018 1329     A/P: 1. Medication review: Patient currently on Humira  for ankylosing spondylitis. Reviewed the medication with the patient, including the following: Humira  is a TNF blocking agent indicated for ankylosing spondylitis, Crohn's disease, Hidradenitis suppurativa, psoriatic arthritis, plaque psoriasis, ulcerative colitis, and uveitis. Patient educated on purpose, proper use and potential adverse effects of Humira . The most common adverse effects are infections, headache, and injection site reactions. There is the possibility of an increased risk of malignancy but it is not well understood if this increased  risk is due to there medication or the disease state. There are rare cases of pancytopenia and aplastic anemia. No recommendations for any changes at this time.  Herlene Fleeta Morris, PharmD, JAQUELINE, CPP Clinical Pharmacist [facility] & [facility] [phone]

## 2024-04-15 ENCOUNTER — Other Ambulatory Visit (HOSPITAL_COMMUNITY): Payer: Self-pay

## 2024-04-15 ENCOUNTER — Other Ambulatory Visit: Payer: Self-pay

## 2024-04-17 DIAGNOSIS — M4322 Fusion of spine, cervical region: Secondary | ICD-10-CM | POA: Diagnosis not present

## 2024-04-21 ENCOUNTER — Other Ambulatory Visit (HOSPITAL_COMMUNITY): Payer: Self-pay

## 2024-04-21 ENCOUNTER — Other Ambulatory Visit: Payer: Self-pay

## 2024-04-21 DIAGNOSIS — M4322 Fusion of spine, cervical region: Secondary | ICD-10-CM | POA: Diagnosis not present

## 2024-04-21 NOTE — Progress Notes (Signed)
 Specialty Pharmacy Refill Coordination Note  Adrienne Campbell is a 63 y.o. female contacted today regarding refills of specialty medication(s) Adalimumab  (Humira  (2 Syringe))   Patient requested [name] at [facility] at [place] date: 04/22/24   Medication will be filled on 04/21/24.

## 2024-04-24 ENCOUNTER — Other Ambulatory Visit (HOSPITAL_COMMUNITY): Payer: Self-pay

## 2024-04-24 ENCOUNTER — Other Ambulatory Visit (HOSPITAL_COMMUNITY): Payer: Self-pay | Admitting: Orthopedic Surgery

## 2024-04-24 DIAGNOSIS — M542 Cervicalgia: Secondary | ICD-10-CM | POA: Diagnosis not present

## 2024-04-24 DIAGNOSIS — M4322 Fusion of spine, cervical region: Secondary | ICD-10-CM | POA: Diagnosis not present

## 2024-04-24 DIAGNOSIS — M5412 Radiculopathy, cervical region: Secondary | ICD-10-CM | POA: Diagnosis not present

## 2024-04-24 MED ORDER — METHYLPREDNISOLONE 4 MG PO TBPK
ORAL_TABLET | ORAL | 0 refills | Status: DC
  Filled 2024-04-24: qty 21, 6d supply, fill #0

## 2024-04-30 ENCOUNTER — Other Ambulatory Visit: Payer: Self-pay

## 2024-04-30 NOTE — Progress Notes (Signed)
 Clinical Intervention Note  Clinical Intervention Notes: I spoke with the patient's husband regarding their upcoming vacation and questions about Humira  administration. The patient typically doses on Sundays but plans to leave on Saturday, so she would like to take her dose one day early. She will also need to take an additional dose 8 days into the trip.  I advised that Humira  can be stored at room temperature (up to 70F/25C) for up to 14 days, but should be kept out of excessive heat during travel. He understood and plans to bring the dose with them, storing it at room temperature until it is time to administer.   Clinical Intervention Outcomes: Improved therapy adherence   Silvano LOISE Dolly Specialty Pharmacist

## 2024-05-01 ENCOUNTER — Ambulatory Visit (HOSPITAL_COMMUNITY)
Admission: RE | Admit: 2024-05-01 | Discharge: 2024-05-01 | Disposition: A | Discharge status: Home / Self Care | Source: Ambulatory Visit | Attending: Orthopedic Surgery | Admitting: Orthopedic Surgery

## 2024-05-01 DIAGNOSIS — M542 Cervicalgia: Secondary | ICD-10-CM

## 2024-05-01 DIAGNOSIS — M5021 Other cervical disc displacement,  high cervical region: Secondary | ICD-10-CM | POA: Diagnosis not present

## 2024-05-01 DIAGNOSIS — M5023 Other cervical disc displacement, cervicothoracic region: Secondary | ICD-10-CM | POA: Diagnosis not present

## 2024-05-01 DIAGNOSIS — M47813 Spondylosis without myelopathy or radiculopathy, cervicothoracic region: Secondary | ICD-10-CM | POA: Diagnosis not present

## 2024-05-01 DIAGNOSIS — M47811 Spondylosis without myelopathy or radiculopathy, occipito-atlanto-axial region: Secondary | ICD-10-CM | POA: Diagnosis not present

## 2024-05-01 DIAGNOSIS — M47812 Spondylosis without myelopathy or radiculopathy, cervical region: Secondary | ICD-10-CM | POA: Diagnosis not present

## 2024-05-01 DIAGNOSIS — M4802 Spinal stenosis, cervical region: Secondary | ICD-10-CM | POA: Diagnosis not present

## 2024-05-13 ENCOUNTER — Other Ambulatory Visit: Payer: Self-pay

## 2024-05-13 ENCOUNTER — Other Ambulatory Visit (HOSPITAL_COMMUNITY): Payer: Self-pay

## 2024-05-13 MED ORDER — HUMIRA (2 SYRINGE) 40 MG/0.8ML ~~LOC~~ PSKT: PREFILLED_SYRINGE | SUBCUTANEOUS | 3 refills | Status: DC

## 2024-05-14 ENCOUNTER — Other Ambulatory Visit (HOSPITAL_COMMUNITY): Payer: Self-pay

## 2024-05-14 ENCOUNTER — Other Ambulatory Visit (HOSPITAL_BASED_OUTPATIENT_CLINIC_OR_DEPARTMENT_OTHER): Payer: Self-pay

## 2024-05-14 ENCOUNTER — Other Ambulatory Visit: Payer: Self-pay

## 2024-05-15 ENCOUNTER — Other Ambulatory Visit: Payer: Self-pay

## 2024-05-18 ENCOUNTER — Other Ambulatory Visit (HOSPITAL_COMMUNITY): Payer: Self-pay

## 2024-05-18 ENCOUNTER — Other Ambulatory Visit: Payer: Self-pay | Admitting: Pharmacist

## 2024-05-18 ENCOUNTER — Other Ambulatory Visit: Payer: Self-pay

## 2024-05-18 MED ORDER — HUMIRA (2 SYRINGE) 40 MG/0.8ML ~~LOC~~ PSKT
PREFILLED_SYRINGE | SUBCUTANEOUS | 3 refills | Status: DC
  Filled 2024-05-18 – 2024-06-01 (×3): qty 4, 28d supply, fill #0
  Filled 2024-06-23 – 2024-06-25 (×2): qty 4, 28d supply, fill #1
  Filled 2024-07-23: qty 4, 28d supply, fill #2
  Filled 2024-08-18: qty 4, 28d supply, fill #3

## 2024-05-19 ENCOUNTER — Other Ambulatory Visit: Payer: Self-pay

## 2024-05-21 ENCOUNTER — Other Ambulatory Visit (HOSPITAL_COMMUNITY): Payer: Self-pay

## 2024-05-21 ENCOUNTER — Other Ambulatory Visit: Payer: Self-pay

## 2024-05-26 ENCOUNTER — Other Ambulatory Visit: Payer: Self-pay

## 2024-05-29 ENCOUNTER — Other Ambulatory Visit (HOSPITAL_COMMUNITY): Payer: Self-pay

## 2024-05-29 DIAGNOSIS — M5412 Radiculopathy, cervical region: Secondary | ICD-10-CM | POA: Diagnosis not present

## 2024-05-29 MED ORDER — METHYLPREDNISOLONE 4 MG PO TBPK
ORAL_TABLET | ORAL | 0 refills | Status: DC
  Filled 2024-05-29: qty 21, 6d supply, fill #0

## 2024-06-01 ENCOUNTER — Other Ambulatory Visit: Payer: Self-pay

## 2024-06-01 ENCOUNTER — Other Ambulatory Visit (HOSPITAL_COMMUNITY): Payer: Self-pay

## 2024-06-01 MED ORDER — CELECOXIB 200 MG PO CAPS
200.0000 mg | ORAL_CAPSULE | Freq: Two times a day (BID) | ORAL | 3 refills | Status: DC
  Filled 2024-06-01: qty 60, 30d supply, fill #0
  Filled 2024-07-15: qty 60, 30d supply, fill #1
  Filled 2024-08-17: qty 60, 30d supply, fill #2
  Filled 2024-09-15: qty 60, 30d supply, fill #3

## 2024-06-01 NOTE — Progress Notes (Signed)
 Specialty Pharmacy Refill Coordination Note  Adrienne Campbell is a 63 y.o. female contacted today regarding refills of specialty medication(s) Adalimumab  (Humira  (2 Syringe))   Patient requested [name] at [facility] at [place] date: 06/02/24   Medication will be filled on 06/01/24.

## 2024-06-01 NOTE — Progress Notes (Signed)
 Specialty Pharmacy Ongoing Clinical Assessment Note  Adrienne Campbell is a 63 y.o. female who is being followed by the specialty pharmacy service for RxSp Ankylosing Spondylitis   Patient's specialty medication(s) reviewed today: Adalimumab  (Humira  (2 Syringe))   Missed doses in the last 4 weeks: 0   Patient/Caregiver did not have any additional questions or concerns.   Therapeutic benefit summary: Patient is achieving benefit   Adverse events/side effects summary: No adverse events/side effects   Patient's therapy is appropriate to: Continue    Goals Addressed             This Visit's Progress    Stabilization of disease   On track    Patient is on track. Patient will maintain adherence. Patient's husband stated that she is well-controlled and not having any arthritis pain.          Follow up: 12 months  [facility]

## 2024-06-02 ENCOUNTER — Other Ambulatory Visit (HOSPITAL_COMMUNITY): Payer: Self-pay

## 2024-06-11 ENCOUNTER — Other Ambulatory Visit (HOSPITAL_COMMUNITY): Payer: Self-pay

## 2024-06-11 MED ORDER — DIAZEPAM 5 MG PO TABS
5.0000 mg | ORAL_TABLET | ORAL | 0 refills | Status: DC
  Filled 2024-06-11: qty 2, 1d supply, fill #0

## 2024-06-15 ENCOUNTER — Other Ambulatory Visit (HOSPITAL_COMMUNITY): Payer: Self-pay

## 2024-06-16 ENCOUNTER — Other Ambulatory Visit (HOSPITAL_COMMUNITY): Payer: Self-pay

## 2024-06-16 ENCOUNTER — Other Ambulatory Visit: Payer: Self-pay

## 2024-06-16 MED ORDER — OMEPRAZOLE 40 MG PO CPDR
40.0000 mg | DELAYED_RELEASE_CAPSULE | Freq: Every day | ORAL | 1 refills | Status: AC
  Filled 2024-06-16: qty 90, 90d supply, fill #0
  Filled 2024-09-15: qty 90, 90d supply, fill #1

## 2024-06-23 ENCOUNTER — Other Ambulatory Visit (HOSPITAL_COMMUNITY): Payer: Self-pay

## 2024-06-24 DIAGNOSIS — M5412 Radiculopathy, cervical region: Secondary | ICD-10-CM | POA: Diagnosis not present

## 2024-06-25 ENCOUNTER — Other Ambulatory Visit (HOSPITAL_COMMUNITY): Payer: Self-pay

## 2024-06-25 ENCOUNTER — Other Ambulatory Visit: Payer: Self-pay

## 2024-06-25 NOTE — Progress Notes (Signed)
 Specialty Pharmacy Refill Coordination Note  Adrienne Campbell is a 63 y.o. female contacted today regarding refills of specialty medication(s) Adalimumab  (Humira  (2 Syringe))   Patient requested [name] at [facility] at [place] date: 06/30/24   Medication will be filled on 06/29/24.

## 2024-06-29 ENCOUNTER — Other Ambulatory Visit: Payer: Self-pay

## 2024-07-01 ENCOUNTER — Telehealth: Payer: Self-pay

## 2024-07-01 ENCOUNTER — Other Ambulatory Visit: Payer: Self-pay

## 2024-07-01 NOTE — Telephone Encounter (Signed)
 Pharmacy Patient Advocate Encounter   Received notification from Patient Pharmacy that prior authorization for Humira  is required/requested.   Insurance verification completed.   The patient is insured through [facility].   Per test claim: PA required; PA submitted to above mentioned insurance via Latent Key/confirmation #/EOC [id number] Status is pending

## 2024-07-03 NOTE — Telephone Encounter (Signed)
 Pharmacy Patient Advocate Encounter  Received notification from [facility] that Prior Authorization for Humira  has been APPROVED from 07/01/24 to 06/30/25   PA #/Case ID/Reference #: [id number]

## 2024-07-23 ENCOUNTER — Other Ambulatory Visit: Payer: Self-pay

## 2024-07-23 NOTE — Progress Notes (Signed)
 Specialty Pharmacy Refill Coordination Note  Adrienne Campbell is a 63 y.o. female contacted today regarding refills of specialty medication(s) Adalimumab  (Humira  (2 Syringe))   Patient requested [name] at [facility] at [place] date: 07/27/24   Medication will be filled on: 07/24/24

## 2024-07-28 ENCOUNTER — Other Ambulatory Visit: Payer: Self-pay

## 2024-08-03 ENCOUNTER — Other Ambulatory Visit (HOSPITAL_COMMUNITY): Payer: Self-pay

## 2024-08-03 ENCOUNTER — Other Ambulatory Visit: Payer: Self-pay

## 2024-08-04 ENCOUNTER — Other Ambulatory Visit (HOSPITAL_COMMUNITY): Payer: Self-pay

## 2024-08-04 MED ORDER — CYCLOBENZAPRINE HCL 10 MG PO TABS
10.0000 mg | ORAL_TABLET | Freq: Every day | ORAL | 0 refills | Status: DC
  Filled 2024-08-04 – 2024-08-10 (×3): qty 30, 30d supply, fill #0

## 2024-08-05 ENCOUNTER — Other Ambulatory Visit (HOSPITAL_COMMUNITY): Payer: Self-pay

## 2024-08-07 DIAGNOSIS — Z981 Arthrodesis status: Secondary | ICD-10-CM | POA: Diagnosis not present

## 2024-08-07 DIAGNOSIS — M5412 Radiculopathy, cervical region: Secondary | ICD-10-CM | POA: Diagnosis not present

## 2024-08-10 ENCOUNTER — Other Ambulatory Visit (HOSPITAL_COMMUNITY): Payer: Self-pay

## 2024-08-17 ENCOUNTER — Other Ambulatory Visit (HOSPITAL_COMMUNITY): Payer: Self-pay

## 2024-08-17 MED ORDER — METHOCARBAMOL 500 MG PO TABS
500.0000 mg | ORAL_TABLET | Freq: Four times a day (QID) | ORAL | 2 refills | Status: AC | PRN
  Filled 2024-08-17: qty 60, 8d supply, fill #0

## 2024-08-17 MED ORDER — CYCLOBENZAPRINE HCL 10 MG PO TABS
10.0000 mg | ORAL_TABLET | Freq: Every day | ORAL | 0 refills | Status: AC
  Filled 2024-08-17 – 2024-08-18 (×2): qty 30, 30d supply, fill #0

## 2024-08-18 ENCOUNTER — Other Ambulatory Visit (HOSPITAL_COMMUNITY): Payer: Self-pay

## 2024-08-20 ENCOUNTER — Other Ambulatory Visit: Payer: Self-pay

## 2024-08-21 ENCOUNTER — Other Ambulatory Visit (HOSPITAL_COMMUNITY): Payer: Self-pay

## 2024-08-21 ENCOUNTER — Other Ambulatory Visit: Payer: Self-pay

## 2024-08-21 ENCOUNTER — Other Ambulatory Visit: Payer: Self-pay | Admitting: Pharmacy Technician

## 2024-08-21 NOTE — Progress Notes (Signed)
 Specialty Pharmacy Refill Coordination Note  Opha Mcghee is a 63 y.o. female contacted today regarding refills of specialty medication(s) Adalimumab  (Humira  (2 Syringe))  Spoke with Husband  Patient requested Pickup at [facility] at [place] date: 08/24/24   Medication will be filled on: 08/24/24

## 2024-09-01 ENCOUNTER — Other Ambulatory Visit: Payer: Self-pay | Admitting: Orthopedic Surgery

## 2024-09-14 ENCOUNTER — Other Ambulatory Visit (HOSPITAL_COMMUNITY): Payer: Self-pay

## 2024-09-14 ENCOUNTER — Other Ambulatory Visit: Payer: Self-pay

## 2024-09-14 ENCOUNTER — Other Ambulatory Visit: Payer: Self-pay | Admitting: Pharmacist

## 2024-09-14 MED ORDER — HUMIRA (2 SYRINGE) 40 MG/0.8ML ~~LOC~~ PSKT
40.0000 mg | PREFILLED_SYRINGE | SUBCUTANEOUS | 0 refills | Status: AC
  Filled 2024-09-14 – 2024-09-15 (×2): qty 3.2, 28d supply, fill #0

## 2024-09-14 MED ORDER — HUMIRA (2 SYRINGE) 40 MG/0.8ML ~~LOC~~ PSKT: PREFILLED_SYRINGE | SUBCUTANEOUS | 0 refills | Status: DC

## 2024-09-15 ENCOUNTER — Other Ambulatory Visit: Payer: Self-pay

## 2024-09-16 ENCOUNTER — Other Ambulatory Visit (HOSPITAL_COMMUNITY): Payer: Self-pay

## 2024-09-17 ENCOUNTER — Other Ambulatory Visit: Payer: Self-pay

## 2024-09-17 ENCOUNTER — Other Ambulatory Visit: Payer: Self-pay | Admitting: Pharmacy Technician

## 2024-09-17 NOTE — Progress Notes (Signed)
 Specialty Pharmacy Refill Coordination Note  Adrienne Campbell is a 64 y.o. female contacted today regarding refills of specialty medication(s) Adalimumab  (Humira  (2 Syringe))  Spoke with Husband  Patient requested Pickup at [facility] at [place] date: 09/24/24   Medication will be filled on: 09/23/24

## 2024-09-18 ENCOUNTER — Other Ambulatory Visit (HOSPITAL_COMMUNITY): Payer: Self-pay

## 2024-09-22 ENCOUNTER — Other Ambulatory Visit: Payer: Self-pay

## 2024-09-23 ENCOUNTER — Other Ambulatory Visit: Payer: Self-pay

## 2024-09-24 NOTE — Pre-Procedure Instructions (Addendum)
 Surgical Instructions   Your procedure is scheduled on Wednesday September 30, 2024. Report to [facility] Main Entrance A at 6:30 A.M., then check in with the Admitting office. Any questions or running late day of surgery: call [phone]  Questions prior to your surgery date: call [phone], Monday-Friday, 8am-4pm. If you experience any cold or flu symptoms such as cough, fever, chills, shortness of breath, etc. between now and your scheduled surgery, please notify us  at the above number.     Remember:  Do not eat after midnight the night before your surgery   You may drink clear liquids until 4:30 the morning of your surgery.   Clear liquids allowed are: Water , Non-Citrus Juices (without pulp), Carbonated Beverages, Clear Tea (no milk, honey, etc.), Black Coffee Only (NO MILK, CREAM OR POWDERED CREAMER of any kind), and Gatorade.  Patient Instructions  The night before surgery:  No food after midnight. ONLY clear liquids after midnight  The day of surgery (if you do NOT have diabetes):  Drink ONE (1) Pre-Surgery Clear Ensure by 4:30 the morning of surgery. Drink in one sitting. Do not sip.  This drink was given to you during your hospital  pre-op appointment visit.  Nothing else to drink after completing the  Pre-Surgery Clear Ensure.         If you have questions, please contact your surgeons office.     Take these medicines the morning of surgery with A SIP OF WATER  : cetirizine  (ZYRTEC )  cyclobenzaprine  (FLEXERIL )  ezetimibe  (ZETIA )  methocarbamol  (ROBAXIN )  omeprazole  (PRILOSEC)  venlafaxine  XR (EFFEXOR -XR)   May take these medicines IF NEEDED: acyclovir  (ZOVIRAX )  diazepam  (VALIUM )  HYDROcodone -acetaminophen  (NORCO/VICODIN)  Meth-Hyo-M Bl-Benz Acd-Ph Sal (URIBEL )    One week prior to surgery, STOP taking any Aspirin  (unless otherwise instructed by your surgeon) Aleve, Naproxen, Ibuprofen, Motrin, Advil, Goody's, BC's, all herbal medications, fish oil, and  non-prescription vitamins.  This includes celecoxib  (CELEBREX )                      Do NOT Smoke (Tobacco/Vaping) for 24 hours prior to your procedure.  If you use a CPAP at night, you may bring your mask/headgear for your overnight stay.   You will be asked to remove any contacts, glasses, piercing's, hearing aid's, dentures/partials prior to surgery. Please bring cases for these items if needed.    Your surgeon will determine if you are to be admitted or discharged the same day.  Patients discharged the day of surgery will not be allowed to drive home, and someone needs to stay with them for 24 hours.  SURGICAL WAITING ROOM VISITATION Patients may have no more than 2 support people in the waiting area - these visitors may rotate.   Pre-op nurse will coordinate an appropriate time for 2 ADULT support persons, who may not rotate, to accompany patient in pre-op.  Children under the age of 25 must have an adult with them who is not the patient and must remain in the main waiting area with an adult.  If the patient needs to stay at the hospital during part of their recovery, the visitor guidelines for inpatient rooms apply.  Please refer to the [facility] website for the visitor guidelines for any additional information.   If you received a COVID test during your pre-op visit  it is requested that you wear a mask when out in public, stay away from anyone that may not be feeling well and notify your  surgeon if you develop symptoms. If you have been in contact with anyone that has tested positive in the last 10 days please notify you surgeon.      Pre-operative 4 CHG Bathing Instructions   You can play a key role in reducing the risk of infection after surgery. Your skin needs to be as free of germs as possible. You can reduce the number of germs on your skin by washing with CHG (chlorhexidine  gluconate) soap before surgery. CHG is an antiseptic soap that kills germs and continues to kill  germs even after washing.   DO NOT use if you have an allergy to chlorhexidine /CHG or antibacterial soaps. If your skin becomes reddened or irritated, stop using the CHG and notify one of our RNs at [phone].   Please shower with the CHG soap starting 4 days before surgery using the following schedule:     Please keep in mind the following:  DO NOT shave, including legs and underarms, starting the day of your first shower.   You may shave your face at any point before/day of surgery.  Place clean sheets on your bed the day you start using CHG soap. Use a clean washcloth (not used since being washed) for each shower. DO NOT sleep with pets once you start using the CHG.   CHG Shower Instructions:  Wash your face and private area with normal soap. If you choose to wash your hair, wash first with your normal shampoo.  After you use shampoo/soap, rinse your hair and body thoroughly to remove shampoo/soap residue.  Turn the water  OFF and apply  bottle of CHG soap to a CLEAN washcloth.  Apply CHG soap ONLY FROM YOUR NECK DOWN TO YOUR TOES (washing for 3-5 minutes)  DO NOT use CHG soap on face, private areas, open wounds, or sores.  Pay special attention to the area where your surgery is being performed.  If you are having back surgery, having someone wash your back for you may be helpful. Wait 2 minutes after CHG soap is applied, then you may rinse off the CHG soap.  Pat dry with a clean towel  Put on clean clothes/pajamas   If you choose to wear lotion, please use ONLY the CHG-compatible lotions that are listed below.  Additional instructions for the day of surgery:  If you choose, you may shower the morning of surgery with an antibacterial soap.  DO NOT APPLY any lotions, deodorants, cologne, or perfumes.   Do not bring valuables to the hospital. [facility] is not responsible for any belongings/valuables. Do not wear nail polish, gel polish, artificial nails, or any other type of  covering on natural nails (fingers and toes) Do not wear jewelry or makeup Put on clean/comfortable clothes.  Please brush your teeth.  Ask your nurse before applying any prescription medications to the skin.     CHG Compatible Lotions   Aveeno Moisturizing lotion  Cetaphil Moisturizing Cream  Cetaphil Moisturizing Lotion  Clairol Herbal Essence Moisturizing Lotion, Dry Skin  Clairol Herbal Essence Moisturizing Lotion, Extra Dry Skin  Clairol Herbal Essence Moisturizing Lotion, Normal Skin  Curel Age Defying Therapeutic Moisturizing Lotion with Alpha Hydroxy  Curel Extreme Care Body Lotion  Curel Soothing Hands Moisturizing Hand Lotion  Curel Therapeutic Moisturizing Cream, Fragrance-Free  Curel Therapeutic Moisturizing Lotion, Fragrance-Free  Curel Therapeutic Moisturizing Lotion, Original Formula  Eucerin Daily Replenishing Lotion  Eucerin Dry Skin Therapy Plus Alpha Hydroxy Crme  Eucerin Dry Skin Therapy Plus Alpha Hydroxy Lotion  Eucerin Original Crme  Eucerin Original Lotion  Eucerin Plus Crme Eucerin Plus Lotion  Eucerin TriLipid Replenishing Lotion  Keri Anti-Bacterial Hand Lotion  Keri Deep Conditioning Original Lotion Dry Skin Formula Softly Scented  Keri Deep Conditioning Original Lotion, Fragrance Free Sensitive Skin Formula  Keri Lotion Fast Absorbing Fragrance Free Sensitive Skin Formula  Keri Lotion Fast Absorbing Softly Scented Dry Skin Formula  Keri Original Lotion  Keri Skin Renewal Lotion Keri Silky Smooth Lotion  Keri Silky Smooth Sensitive Skin Lotion  Nivea Body Creamy Conditioning Oil  Nivea Body Extra Enriched Lotion  Nivea Body Original Lotion  Nivea Body Sheer Moisturizing Lotion Nivea Crme  Nivea Skin Firming Lotion  NutraDerm 30 Skin Lotion  NutraDerm Skin Lotion  NutraDerm Therapeutic Skin Cream  NutraDerm Therapeutic Skin Lotion  ProShield Protective Hand Cream  Provon moisturizing lotion  Please read over the following fact sheets  that you were given.

## 2024-09-25 ENCOUNTER — Encounter (HOSPITAL_COMMUNITY)
Admission: RE | Admit: 2024-09-25 | Discharge: 2024-09-25 | Disposition: A | Source: Ambulatory Visit | Attending: Orthopedic Surgery

## 2024-09-25 ENCOUNTER — Encounter (HOSPITAL_COMMUNITY): Payer: Self-pay

## 2024-09-25 ENCOUNTER — Other Ambulatory Visit: Payer: Self-pay

## 2024-09-25 VITALS — BP 140/82 | HR 87 | Temp 98.6°F | Resp 16 | Ht 64.0 in | Wt 142.7 lb

## 2024-09-25 DIAGNOSIS — Z01818 Encounter for other preprocedural examination: Secondary | ICD-10-CM | POA: Diagnosis present

## 2024-09-25 LAB — SURGICAL PCR SCREEN
MRSA, PCR: NEGATIVE
Staphylococcus aureus: NEGATIVE

## 2024-09-25 LAB — BASIC METABOLIC PANEL WITH GFR
Anion gap: 11 (ref 5–15)
BUN: 13 mg/dL (ref 8–23)
CO2: 26 mmol/L (ref 22–32)
Calcium: 9.7 mg/dL (ref 8.9–10.3)
Chloride: 94 mmol/L — ABNORMAL LOW (ref 98–111)
Creatinine, Ser: 0.71 mg/dL (ref 0.44–1.00)
GFR, Estimated: >60 mL/min
Glucose, Bld: 94 mg/dL (ref 70–99)
Potassium: 4 mmol/L (ref 3.5–5.1)
Sodium: 131 mmol/L — ABNORMAL LOW (ref 135–145)

## 2024-09-25 LAB — CBC
HCT: 39 % (ref 36.0–46.0)
Hemoglobin: 13.3 g/dL (ref 12.0–15.0)
MCH: 33.7 pg (ref 26.0–34.0)
MCHC: 34.1 g/dL (ref 30.0–36.0)
MCV: 98.7 fL (ref 80.0–100.0)
Platelets: 420 K/uL — ABNORMAL HIGH (ref 150–400)
RBC: 3.95 MIL/uL (ref 3.87–5.11)
RDW: 11.7 % (ref 11.5–15.5)
WBC: 10.5 K/uL (ref 4.0–10.5)
nRBC: 0 % (ref 0.0–0.2)

## 2024-09-25 NOTE — Pre-Procedure Instructions (Addendum)
 Surgical Instructions   Your procedure is scheduled on Wednesday September 30, 2024. Report to [facility] Main Entrance A at 5:30 A.M., then check in with the Admitting office. Any questions or running late day of surgery: call [phone]  Questions prior to your surgery date: call [phone], Monday-Friday, 8am-4pm. If you experience any cold or flu symptoms such as cough, fever, chills, shortness of breath, etc. between now and your scheduled surgery, please notify us  at the above number.     Remember:  Do not eat after midnight the night before your surgery   You may drink clear liquids until 4:30 the morning of your surgery.   Clear liquids allowed are: Water , Non-Citrus Juices (without pulp), Carbonated Beverages, Clear Tea (no milk, honey, etc.), Black Coffee Only (NO MILK, CREAM OR POWDERED CREAMER of any kind), and Gatorade.  Patient Instructions  The night before surgery:  No food after midnight. ONLY clear liquids after midnight  The day of surgery (if you do NOT have diabetes):  Drink ONE (1) Pre-Surgery Clear Ensure by 4:30 the morning of surgery. Drink in one sitting. Do not sip.  This drink was given to you during your hospital  pre-op appointment visit.  Nothing else to drink after completing the  Pre-Surgery Clear Ensure.         If you have questions, please contact your surgeons office.     Take these medicines the morning of surgery with A SIP OF WATER  : ezetimibe  (ZETIA )  methocarbamol  (ROBAXIN )  omeprazole  (PRILOSEC) venlafaxine  XR (EFFEXOR -XR)   May take these medicines IF NEEDED: acyclovir  (ZOVIRAX )  Eye drops Meth-Hyo-M Bl-Benz Acd-Ph Sal (URIBEL )    One week prior to surgery, STOP taking any Aspirin  (unless otherwise instructed by your surgeon) Aleve, Naproxen, Ibuprofen, Motrin, Advil, Goody's, BC's, all herbal medications, fish oil, and non-prescription vitamins.  This includes celecoxib  (CELEBREX )                      Do NOT Smoke  (Tobacco/Vaping) for 24 hours prior to your procedure.  If you use a CPAP at night, you may bring your mask/headgear for your overnight stay.   You will be asked to remove any contacts, glasses, piercing's, hearing aid's, dentures/partials prior to surgery. Please bring cases for these items if needed.    Your surgeon will determine if you are to be admitted or discharged the same day.  Patients discharged the day of surgery will not be allowed to drive home, and someone needs to stay with them for 24 hours.  SURGICAL WAITING ROOM VISITATION Patients may have no more than 2 support people in the waiting area - these visitors may rotate.   Pre-op nurse will coordinate an appropriate time for 2 ADULT support persons, who may not rotate, to accompany patient in pre-op.  Children under the age of 90 must have an adult with them who is not the patient and must remain in the main waiting area with an adult.  If the patient needs to stay at the hospital during part of their recovery, the visitor guidelines for inpatient rooms apply.  Please refer to the [facility] website for the visitor guidelines for any additional information.   If you received a COVID test during your pre-op visit  it is requested that you wear a mask when out in public, stay away from anyone that may not be feeling well and notify your surgeon if you develop symptoms. If you have been in contact  with anyone that has tested positive in the last 10 days please notify you surgeon.      Pre-operative 4 CHG Bathing Instructions   You can play a key role in reducing the risk of infection after surgery. Your skin needs to be as free of germs as possible. You can reduce the number of germs on your skin by washing with CHG (chlorhexidine  gluconate) soap before surgery. CHG is an antiseptic soap that kills germs and continues to kill germs even after washing.   DO NOT use if you have an allergy to chlorhexidine /CHG or antibacterial  soaps. If your skin becomes reddened or irritated, stop using the CHG and notify one of our RNs at [phone].   Please shower with the CHG soap starting 4 days before surgery using the following schedule:     Please keep in mind the following:  DO NOT shave, including legs and underarms, starting the day of your first shower.   Place clean sheets on your bed the day you start using CHG soap. Use a clean washcloth (not used since being washed) for each shower. DO NOT sleep with pets once you start using the CHG.   CHG Shower Instructions:  Wash your face and private area with normal soap. If you choose to wash your hair, wash first with your normal shampoo.  After you use shampoo/soap, rinse your hair and body thoroughly to remove shampoo/soap residue.  Turn the water  OFF and apply  bottle of CHG soap to a CLEAN washcloth.  Apply CHG soap ONLY FROM YOUR NECK DOWN TO YOUR TOES (washing for 3-5 minutes)  DO NOT use CHG soap on face, private areas, open wounds, or sores.  Pay special attention to the area where your surgery is being performed.  If you are having back surgery, having someone wash your back for you may be helpful. Wait 2 minutes after CHG soap is applied, then you may rinse off the CHG soap.  Pat dry with a clean towel  Put on clean clothes/pajamas   If you choose to wear lotion, please use ONLY the CHG-compatible lotions that are listed below.  Additional instructions for the day of surgery:  If you choose, you may shower the morning of surgery with an antibacterial soap.  DO NOT APPLY any lotions, deodorants or perrfumes.   Do not bring valuables to the hospital. [facility] is not responsible for any belongings/valuables. Do not wear nail polish, gel polish, artificial nails, or any other type of covering on natural nails (fingers and toes) Do not wear jewelry or makeup Put on clean/comfortable clothes.  Please brush your teeth.  Ask your nurse before applying any  prescription medications to the skin.     CHG Compatible Lotions   Aveeno Moisturizing lotion  Cetaphil Moisturizing Cream  Cetaphil Moisturizing Lotion  Clairol Herbal Essence Moisturizing Lotion, Dry Skin  Clairol Herbal Essence Moisturizing Lotion, Extra Dry Skin  Clairol Herbal Essence Moisturizing Lotion, Normal Skin  Curel Age Defying Therapeutic Moisturizing Lotion with Alpha Hydroxy  Curel Extreme Care Body Lotion  Curel Soothing Hands Moisturizing Hand Lotion  Curel Therapeutic Moisturizing Cream, Fragrance-Free  Curel Therapeutic Moisturizing Lotion, Fragrance-Free  Curel Therapeutic Moisturizing Lotion, Original Formula  Eucerin Daily Replenishing Lotion  Eucerin Dry Skin Therapy Plus Alpha Hydroxy Crme  Eucerin Dry Skin Therapy Plus Alpha Hydroxy Lotion  Eucerin Original Crme  Eucerin Original Lotion  Eucerin Plus Crme Eucerin Plus Lotion  Eucerin TriLipid Replenishing Lotion  Keri Anti-Bacterial Hand Lotion  Keri Deep Conditioning Original Lotion Dry Skin Formula Softly Scented  Keri Deep Conditioning Original Lotion, Fragrance Free Sensitive Skin Formula  Keri Lotion Fast Absorbing Fragrance Free Sensitive Skin Formula  Keri Lotion Fast Absorbing Softly Scented Dry Skin Formula  Keri Original Lotion  Keri Skin Renewal Lotion Keri Silky Smooth Lotion  Keri Silky Smooth Sensitive Skin Lotion  Nivea Body Creamy Conditioning Oil  Nivea Body Extra Enriched Lotion  Nivea Body Original Lotion  Nivea Body Sheer Moisturizing Lotion Nivea Crme  Nivea Skin Firming Lotion  NutraDerm 30 Skin Lotion  NutraDerm Skin Lotion  NutraDerm Therapeutic Skin Cream  NutraDerm Therapeutic Skin Lotion  ProShield Protective Hand Cream  Provon moisturizing lotion  Please read over the following fact sheets that you were given.

## 2024-09-25 NOTE — Pre-Procedure Instructions (Signed)
 Surgical Instructions   Your procedure is scheduled on Wednesday September 30, 2024. Report to [facility] Main Entrance A at :30 A.M., then check in with the Admitting office. Any questions or running late day of surgery: call [phone]  Questions prior to your surgery date: call [phone], Monday-Friday, 8am-4pm. If you experience any cold or flu symptoms such as cough, fever, chills, shortness of breath, etc. between now and your scheduled surgery, please notify us  at the above number.     Remember:  Do not eat after midnight the night before your surgery   You may drink clear liquids until 4:30 the morning of your surgery.   Clear liquids allowed are: Water , Non-Citrus Juices (without pulp), Carbonated Beverages, Clear Tea (no milk, honey, etc.), Black Coffee Only (NO MILK, CREAM OR POWDERED CREAMER of any kind), and Gatorade.  Patient Instructions  The night before surgery:  No food after midnight. ONLY clear liquids after midnight  The day of surgery (if you do NOT have diabetes):  Drink ONE (1) Pre-Surgery Clear Ensure by 4:30 the morning of surgery. Drink in one sitting. Do not sip.  This drink was given to you during your hospital  pre-op appointment visit.  Nothing else to drink after completing the  Pre-Surgery Clear Ensure.         If you have questions, please contact your surgeons office.     Take these medicines the morning of surgery with A SIP OF WATER  : cetirizine  (ZYRTEC )  cyclobenzaprine  (FLEXERIL )  ezetimibe  (ZETIA )  methocarbamol  (ROBAXIN )  omeprazole  (PRILOSEC)  venlafaxine  XR (EFFEXOR -XR)   May take these medicines IF NEEDED: acyclovir  (ZOVIRAX )  diazepam  (VALIUM )  HYDROcodone -acetaminophen  (NORCO/VICODIN)  Meth-Hyo-M Bl-Benz Acd-Ph Sal (URIBEL )    One week prior to surgery, STOP taking any Aspirin  (unless otherwise instructed by your surgeon) Aleve, Naproxen, Ibuprofen, Motrin, Advil, Goody's, BC's, all herbal medications, fish oil, and  non-prescription vitamins.  This includes celecoxib  (CELEBREX )                      Do NOT Smoke (Tobacco/Vaping) for 24 hours prior to your procedure.  If you use a CPAP at night, you may bring your mask/headgear for your overnight stay.   You will be asked to remove any contacts, glasses, piercing's, hearing aid's, dentures/partials prior to surgery. Please bring cases for these items if needed.    Your surgeon will determine if you are to be admitted or discharged the same day.  Patients discharged the day of surgery will not be allowed to drive home, and someone needs to stay with them for 24 hours.  SURGICAL WAITING ROOM VISITATION Patients may have no more than 2 support people in the waiting area - these visitors may rotate.   Pre-op nurse will coordinate an appropriate time for 2 ADULT support persons, who may not rotate, to accompany patient in pre-op.  Children under the age of 9 must have an adult with them who is not the patient and must remain in the main waiting area with an adult.  If the patient needs to stay at the hospital during part of their recovery, the visitor guidelines for inpatient rooms apply.  Please refer to the [facility] website for the visitor guidelines for any additional information.   If you received a COVID test during your pre-op visit  it is requested that you wear a mask when out in public, stay away from anyone that may not be feeling well and notify your  surgeon if you develop symptoms. If you have been in contact with anyone that has tested positive in the last 10 days please notify you surgeon.      Pre-operative 4 CHG Bathing Instructions   You can play a key role in reducing the risk of infection after surgery. Your skin needs to be as free of germs as possible. You can reduce the number of germs on your skin by washing with CHG (chlorhexidine  gluconate) soap before surgery. CHG is an antiseptic soap that kills germs and continues to kill  germs even after washing.   DO NOT use if you have an allergy to chlorhexidine /CHG or antibacterial soaps. If your skin becomes reddened or irritated, stop using the CHG and notify one of our RNs at [phone].   Please shower with the CHG soap starting 4 days before surgery using the following schedule:     Please keep in mind the following:  DO NOT shave, including legs and underarms, starting the day of your first shower.   Place clean sheets on your bed the day you start using CHG soap. Use a clean washcloth (not used since being washed) for each shower. DO NOT sleep with pets once you start using the CHG.   CHG Shower Instructions:  Wash your face and private area with normal soap. If you choose to wash your hair, wash first with your normal shampoo.  After you use shampoo/soap, rinse your hair and body thoroughly to remove shampoo/soap residue.  Turn the water  OFF and apply  bottle of CHG soap to a CLEAN washcloth.  Apply CHG soap ONLY FROM YOUR NECK DOWN TO YOUR TOES (washing for 3-5 minutes)  DO NOT use CHG soap on face, private areas, open wounds, or sores.  Pay special attention to the area where your surgery is being performed.  If you are having back surgery, having someone wash your back for you may be helpful. Wait 2 minutes after CHG soap is applied, then you may rinse off the CHG soap.  Pat dry with a clean towel  Put on clean clothes/pajamas   If you choose to wear lotion, please use ONLY the CHG-compatible lotions that are listed below.  Additional instructions for the day of surgery:  If you choose, you may shower the morning of surgery with an antibacterial soap.  DO NOT APPLY any lotions, deodorants or perrfumes.   Do not bring valuables to the hospital. [facility] is not responsible for any belongings/valuables. Do not wear nail polish, gel polish, artificial nails, or any other type of covering on natural nails (fingers and toes) Do not wear jewelry or  makeup Put on clean/comfortable clothes.  Please brush your teeth.  Ask your nurse before applying any prescription medications to the skin.     CHG Compatible Lotions   Aveeno Moisturizing lotion  Cetaphil Moisturizing Cream  Cetaphil Moisturizing Lotion  Clairol Herbal Essence Moisturizing Lotion, Dry Skin  Clairol Herbal Essence Moisturizing Lotion, Extra Dry Skin  Clairol Herbal Essence Moisturizing Lotion, Normal Skin  Curel Age Defying Therapeutic Moisturizing Lotion with Alpha Hydroxy  Curel Extreme Care Body Lotion  Curel Soothing Hands Moisturizing Hand Lotion  Curel Therapeutic Moisturizing Cream, Fragrance-Free  Curel Therapeutic Moisturizing Lotion, Fragrance-Free  Curel Therapeutic Moisturizing Lotion, Original Formula  Eucerin Daily Replenishing Lotion  Eucerin Dry Skin Therapy Plus Alpha Hydroxy Crme  Eucerin Dry Skin Therapy Plus Alpha Hydroxy Lotion  Eucerin Original Crme  Eucerin Original Lotion  Eucerin Plus Crme Eucerin Plus  Lotion  Eucerin TriLipid Replenishing Lotion  Keri Anti-Bacterial Hand Lotion  Keri Deep Conditioning Original Lotion Dry Skin Formula Softly Scented  Keri Deep Conditioning Original Lotion, Fragrance Free Sensitive Skin Formula  Keri Lotion Fast Absorbing Fragrance Free Sensitive Skin Formula  Keri Lotion Fast Absorbing Softly Scented Dry Skin Formula  Keri Original Lotion  Keri Skin Renewal Lotion Keri Silky Smooth Lotion  Keri Silky Smooth Sensitive Skin Lotion  Nivea Body Creamy Conditioning Oil  Nivea Body Extra Enriched Lotion  Nivea Body Original Lotion  Nivea Body Sheer Moisturizing Lotion Nivea Crme  Nivea Skin Firming Lotion  NutraDerm 30 Skin Lotion  NutraDerm Skin Lotion  NutraDerm Therapeutic Skin Cream  NutraDerm Therapeutic Skin Lotion  ProShield Protective Hand Cream  Provon moisturizing lotion  Please read over the following fact sheets that you were given.

## 2024-09-25 NOTE — Progress Notes (Signed)
 PCP - Dr. Elfrieda Koirala Cardiologist - Dr. Vinie Maxcy, LOV 03/19/2024  PPM/ICD - denies Device Orders - na Rep Notified -   Chest x-ray -  EKG - 01/07/2024 Stress Test -  ECHO -  Cardiac Cath -   Sleep Study - + OSA CPAP - threw CPAP away  Non-diabetic  Blood Thinner Instructions: denies Aspirin  Instructions:denies  ERAS Protcol - Ensure until 0430  Anesthesia review:   Patient denies shortness of breath, fever, cough and chest pain at PAT appointment   All instructions explained to the patient, with a verbal understanding of the material. Patient agrees to go over the instructions while at home for a better understanding. Patient also instructed to self quarantine after being tested for COVID-19. The opportunity to ask questions was provided.

## 2024-09-30 ENCOUNTER — Ambulatory Visit (HOSPITAL_COMMUNITY): Admitting: Anesthesiology

## 2024-09-30 ENCOUNTER — Encounter (HOSPITAL_COMMUNITY)
Admission: RE | Disposition: A | Discharge status: Home / Self Care | Payer: Self-pay | Source: Home / Self Care | Attending: Orthopedic Surgery

## 2024-09-30 ENCOUNTER — Encounter (HOSPITAL_COMMUNITY): Payer: Self-pay | Admitting: Orthopedic Surgery

## 2024-09-30 ENCOUNTER — Ambulatory Visit (HOSPITAL_COMMUNITY)
Admission: RE | Admit: 2024-09-30 | Discharge: 2024-09-30 | Disposition: A | Discharge status: Home / Self Care | Attending: Orthopedic Surgery | Admitting: Orthopedic Surgery

## 2024-09-30 ENCOUNTER — Ambulatory Visit (HOSPITAL_COMMUNITY)

## 2024-09-30 ENCOUNTER — Other Ambulatory Visit: Payer: Self-pay

## 2024-09-30 ENCOUNTER — Other Ambulatory Visit (HOSPITAL_COMMUNITY): Payer: Self-pay

## 2024-09-30 DIAGNOSIS — G4730 Sleep apnea, unspecified: Secondary | ICD-10-CM | POA: Diagnosis not present

## 2024-09-30 DIAGNOSIS — K219 Gastro-esophageal reflux disease without esophagitis: Secondary | ICD-10-CM | POA: Insufficient documentation

## 2024-09-30 DIAGNOSIS — M5412 Radiculopathy, cervical region: Secondary | ICD-10-CM

## 2024-09-30 DIAGNOSIS — M5013 Cervical disc disorder with radiculopathy, cervicothoracic region: Secondary | ICD-10-CM | POA: Diagnosis not present

## 2024-09-30 DIAGNOSIS — F32A Depression, unspecified: Secondary | ICD-10-CM | POA: Diagnosis not present

## 2024-09-30 DIAGNOSIS — Z981 Arthrodesis status: Secondary | ICD-10-CM | POA: Diagnosis not present

## 2024-09-30 DIAGNOSIS — M4802 Spinal stenosis, cervical region: Secondary | ICD-10-CM | POA: Diagnosis present

## 2024-09-30 MED ORDER — LIDOCAINE 2% (20 MG/ML) 5 ML SYRINGE
INTRAMUSCULAR | Status: AC
  Filled 2024-09-30: qty 5

## 2024-09-30 MED ORDER — BUPIVACAINE-EPINEPHRINE (PF) 0.25% -1:200000 IJ SOLN
INTRAMUSCULAR | Status: AC
  Filled 2024-09-30: qty 30

## 2024-09-30 MED ORDER — BUPIVACAINE-EPINEPHRINE 0.25% -1:200000 IJ SOLN
INTRAMUSCULAR | Status: DC | PRN
  Administered 2024-09-30: 2 mL

## 2024-09-30 MED ORDER — CEFAZOLIN SODIUM-DEXTROSE 2-4 GM/100ML-% IV SOLN
2.0000 g | INTRAVENOUS | Status: AC
  Administered 2024-09-30: 2 g via INTRAVENOUS

## 2024-09-30 MED ORDER — HYDROMORPHONE HCL 1 MG/ML IJ SOLN
INTRAMUSCULAR | Status: AC
  Filled 2024-09-30: qty 0.5

## 2024-09-30 MED ORDER — FENTANYL CITRATE (PF) 100 MCG/2ML IJ SOLN
INTRAMUSCULAR | Status: AC
  Filled 2024-09-30: qty 2

## 2024-09-30 MED ORDER — PROPOFOL 10 MG/ML IV BOLUS
INTRAVENOUS | Status: AC
  Filled 2024-09-30: qty 20

## 2024-09-30 MED ORDER — MIDAZOLAM HCL 2 MG/2ML IJ SOLN
INTRAMUSCULAR | Status: AC
  Filled 2024-09-30: qty 2

## 2024-09-30 MED ORDER — ONDANSETRON HCL 4 MG/2ML IJ SOLN
INTRAMUSCULAR | Status: DC | PRN
  Administered 2024-09-30: 4 mg via INTRAVENOUS

## 2024-09-30 MED ORDER — AMISULPRIDE (ANTIEMETIC) 5 MG/2ML IV SOLN: 10.0000 mg | Freq: Once | INTRAVENOUS | Status: DC | PRN

## 2024-09-30 MED ORDER — METHOCARBAMOL 500 MG PO TABS
500.0000 mg | ORAL_TABLET | Freq: Four times a day (QID) | ORAL | 2 refills | Status: AC
  Filled 2024-09-30 (×2): qty 60, 8d supply, fill #0
  Filled 2024-10-01: qty 60, 8d supply, fill #1

## 2024-09-30 MED ORDER — CHLORHEXIDINE GLUCONATE 0.12 % MT SOLN
OROMUCOSAL | Status: AC
  Administered 2024-09-30: 15 mL via OROMUCOSAL
  Filled 2024-09-30: qty 15

## 2024-09-30 MED ORDER — ROCURONIUM BROMIDE 10 MG/ML (PF) SYRINGE
PREFILLED_SYRINGE | INTRAVENOUS | Status: AC
  Filled 2024-09-30: qty 10

## 2024-09-30 MED ORDER — THROMBIN 20000 UNITS EX SOLR
OROMUCOSAL | Status: DC | PRN
  Administered 2024-09-30: 20 mL via TOPICAL

## 2024-09-30 MED ORDER — HYDROMORPHONE HCL 1 MG/ML IJ SOLN
INTRAMUSCULAR | Status: DC | PRN
  Administered 2024-09-30: .5 mg via INTRAVENOUS

## 2024-09-30 MED ORDER — ACETAMINOPHEN 10 MG/ML IV SOLN: 1000.0000 mg | Freq: Once | INTRAVENOUS | Status: DC | PRN

## 2024-09-30 MED ORDER — MIDAZOLAM HCL (PF) 2 MG/2ML IJ SOLN
INTRAMUSCULAR | Status: DC | PRN
  Administered 2024-09-30: 2 mg via INTRAVENOUS

## 2024-09-30 MED ORDER — ONDANSETRON HCL 4 MG/2ML IJ SOLN
INTRAMUSCULAR | Status: AC
  Filled 2024-09-30: qty 2

## 2024-09-30 MED ORDER — THROMBIN 20000 UNITS EX SOLR
CUTANEOUS | Status: AC
  Filled 2024-09-30: qty 20000

## 2024-09-30 MED ORDER — DEXAMETHASONE SOD PHOSPHATE PF 10 MG/ML IJ SOLN
INTRAMUSCULAR | Status: AC
  Filled 2024-09-30: qty 1

## 2024-09-30 MED ORDER — SUGAMMADEX SODIUM 200 MG/2ML IV SOLN
INTRAVENOUS | Status: DC | PRN
  Administered 2024-09-30: 200 mg via INTRAVENOUS
  Administered 2024-09-30: 50 mg via INTRAVENOUS

## 2024-09-30 MED ORDER — DEXMEDETOMIDINE HCL IN NACL 200 MCG/50ML IV SOLN
INTRAVENOUS | Status: DC | PRN
  Administered 2024-09-30: 4 ug via INTRAVENOUS

## 2024-09-30 MED ORDER — ROCURONIUM BROMIDE 10 MG/ML (PF) SYRINGE
PREFILLED_SYRINGE | INTRAVENOUS | Status: DC | PRN
  Administered 2024-09-30: 40 mg via INTRAVENOUS
  Administered 2024-09-30: 50 mg via INTRAVENOUS

## 2024-09-30 MED ORDER — CHLORHEXIDINE GLUCONATE 0.12 % MT SOLN: 15.0000 mL | Freq: Once | OROMUCOSAL | Status: AC

## 2024-09-30 MED ORDER — POVIDONE-IODINE 7.5 % EX SOLN
Freq: Once | CUTANEOUS | Status: AC
  Filled 2024-09-30: qty 118

## 2024-09-30 MED ORDER — TRAMADOL HCL 50 MG PO TABS: 50.0000 mg | ORAL_TABLET | Freq: Four times a day (QID) | ORAL | Status: DC | PRN

## 2024-09-30 MED ORDER — LIDOCAINE 2% (20 MG/ML) 5 ML SYRINGE
INTRAMUSCULAR | Status: DC | PRN
  Administered 2024-09-30: 60 mg via INTRAVENOUS

## 2024-09-30 MED ORDER — PROPOFOL 500 MG/50ML IV EMUL
INTRAVENOUS | Status: DC | PRN
  Administered 2024-09-30: 25 ug/kg/min via INTRAVENOUS

## 2024-09-30 MED ORDER — TRAMADOL HCL 50 MG PO TABS
50.0000 mg | ORAL_TABLET | Freq: Four times a day (QID) | ORAL | 0 refills | Status: AC | PRN
  Filled 2024-09-30: qty 57, 8d supply, fill #0

## 2024-09-30 MED ORDER — CEFAZOLIN SODIUM-DEXTROSE 2-4 GM/100ML-% IV SOLN
INTRAVENOUS | Status: AC
  Filled 2024-09-30: qty 100

## 2024-09-30 MED ORDER — LACTATED RINGERS IV SOLN: INTRAVENOUS | Status: DC

## 2024-09-30 MED ORDER — OXYCODONE HCL 5 MG PO TABS: 5.0000 mg | ORAL_TABLET | Freq: Once | ORAL | Status: DC | PRN

## 2024-09-30 MED ORDER — FENTANYL CITRATE (PF) 250 MCG/5ML IJ SOLN
INTRAMUSCULAR | Status: DC | PRN
  Administered 2024-09-30: 50 ug via INTRAVENOUS
  Administered 2024-09-30: 100 ug via INTRAVENOUS
  Administered 2024-09-30: 150 ug via INTRAVENOUS
  Administered 2024-09-30: 50 ug via INTRAVENOUS

## 2024-09-30 MED ORDER — 0.9 % SODIUM CHLORIDE (POUR BTL) OPTIME
TOPICAL | Status: DC | PRN
  Administered 2024-09-30: 1000 mL

## 2024-09-30 MED ORDER — FENTANYL CITRATE (PF) 250 MCG/5ML IJ SOLN
INTRAMUSCULAR | Status: AC
  Filled 2024-09-30: qty 5

## 2024-09-30 MED ORDER — PHENYLEPHRINE 80 MCG/ML (10ML) SYRINGE FOR IV PUSH (FOR BLOOD PRESSURE SUPPORT)
PREFILLED_SYRINGE | INTRAVENOUS | Status: AC
  Filled 2024-09-30: qty 10

## 2024-09-30 MED ORDER — PROPOFOL 10 MG/ML IV BOLUS
INTRAVENOUS | Status: DC | PRN
  Administered 2024-09-30: 140 mg via INTRAVENOUS

## 2024-09-30 MED ORDER — OXYCODONE HCL 5 MG/5ML PO SOLN: 5.0000 mg | Freq: Once | ORAL | Status: DC | PRN

## 2024-09-30 MED ORDER — LABETALOL HCL 5 MG/ML IV SOLN
INTRAVENOUS | Status: AC
  Filled 2024-09-30: qty 4

## 2024-09-30 MED ORDER — LABETALOL HCL 5 MG/ML IV SOLN
5.0000 mg | INTRAVENOUS | Status: DC | PRN
  Administered 2024-09-30 (×2): 5 mg via INTRAVENOUS

## 2024-09-30 MED ORDER — FENTANYL CITRATE (PF) 100 MCG/2ML IJ SOLN: 25.0000 ug | INTRAMUSCULAR | Status: DC | PRN

## 2024-09-30 MED ORDER — LACTATED RINGERS IV SOLN: INTRAVENOUS | Status: DC | PRN

## 2024-09-30 MED ORDER — ORAL CARE MOUTH RINSE: 15.0000 mL | Freq: Once | OROMUCOSAL | Status: AC

## 2024-09-30 NOTE — Anesthesia Preprocedure Evaluation (Addendum)
"                                    Anesthesia Evaluation  Patient identified by MRN, date of birth, ID band Patient awake    Reviewed: Allergy & Precautions, NPO status , Patient's Chart, lab work & pertinent test results  History of Anesthesia Complications (+) PONV and history of anesthetic complications  Airway Mallampati: II  TM Distance: >3 FB Neck ROM: Limited    Dental no notable dental hx.    Pulmonary sleep apnea    Pulmonary exam normal breath sounds clear to auscultation       Cardiovascular hypertension, Pt. on medications Normal cardiovascular exam     Neuro/Psych  PSYCHIATRIC DISORDERS  Depression     Neuromuscular disease    GI/Hepatic Neg liver ROS,GERD  Medicated and Controlled,,  Endo/Other  negative endocrine ROS    Renal/GU negative Renal ROS     Musculoskeletal  (+) Arthritis ,    Abdominal   Peds  Hematology negative hematology ROS (+)   Anesthesia Other Findings CERVICAL RADICULOPATHY  Reproductive/Obstetrics                              Anesthesia Physical Anesthesia Plan  ASA: 2  Anesthesia Plan: General   Post-op Pain Management:    Induction: Intravenous  PONV Risk Score and Plan: Ondansetron , Dexamethasone , Propofol  infusion, Midazolam  and Treatment may vary due to age or medical condition  Airway Management Planned: Oral ETT and Video Laryngoscope Planned  Additional Equipment:   Intra-op Plan:   Post-operative Plan: Extubation in OR  Informed Consent: I have reviewed the patients History and Physical, chart, labs and discussed the procedure including the risks, benefits and alternatives for the proposed anesthesia with the patient or authorized representative who has indicated his/her understanding and acceptance.     Dental advisory given  Plan Discussed with: CRNA  Anesthesia Plan Comments:          Anesthesia Quick Evaluation  "

## 2024-09-30 NOTE — Transfer of Care (Signed)
 Immediate Anesthesia Transfer of Care Note  Patient: Adrienne Campbell  Procedure(s) Performed: ANTERIOR CERVICAL DECOMPRESSION/DISCECTOMY FUSION CERVICAL SEVEN-THORACIC ONE INSTRUMENTATION ALLOGRAFT  Patient Location: PACU  Anesthesia Type:General  Level of Consciousness: oriented, drowsy, and patient cooperative  Airway & Oxygen Therapy: Patient Spontanous Breathing  Post-op Assessment: Report given to RN and Post -op Vital signs reviewed and stable  Post vital signs: Reviewed and stable  Last Vitals:  Vitals Value Taken Time  BP 163/75 09/30/24 10:23  Temp    Pulse 88 09/30/24 10:25  Resp 20 09/30/24 10:25  SpO2 92 % 09/30/24 10:25  Vitals shown include unfiled device data.  Last Pain:  Vitals:   09/30/24 0606  PainSc: 4       Patients Stated Pain Goal: 1 (09/30/24 0606)  Complications: No notable events documented.

## 2024-09-30 NOTE — Op Note (Signed)
 PATIENT NAME: Adrienne Campbell   MEDICAL RECORD NO.:   [id number]    DATE OF BIRTH: 1961/04/18   DATE OF PROCEDURE: 09/30/2024                               OPERATIVE REPORT     PREOPERATIVE DIAGNOSES: 1. Right-sided cervical radiculopathy 2. Spinal stenosis, C7/T1 3. S/p prior fusion, C5-C7   POSTOPERATIVE DIAGNOSES: 1. Right-sided cervical radiculopathy 2. Spinal stenosis, C7/T1 3. S/p prior fusion, C5-C7   PROCEDURE: 1. Anterior cervical decompression and fusion C7/T1 2. Placement of anterior instrumentation, C7/T1 3. Insertion of structural allograft x1 (6mm VG2 allograft) 4. Intraoperative use of fluoroscopy.   SURGEON:  Oneil Priestly, MD   ASSISTANT:  Ileana Clara, PA-C.   ANESTHESIA:  General endotracheal anesthesia.   COMPLICATIONS:  None.   DISPOSITION:  Stable.   ESTIMATED BLOOD LOSS:  Minimal.   INDICATIONS FOR SURGERY:  Briefly, Adrienne Campbell  is a pleasant 64 -year- old female, who did present to me with severe pain in his neck and right.   The patient's MRI did reveal the findings noted above.  Given the ongoing rather debilitating pain and lack of improvement with appropriate treatment measures, we did discuss proceeding with the procedure noted above.  The patient was fully aware of the risks and limitations of surgery as outlined in my preoperative note.   OPERATIVE DETAILS:  On 09/30/2024, the patient was brought to surgery and general endotracheal anesthesia was administered.  The patient was placed supine on the hospital bed. The neck was gently extended.  All bony prominences were meticulously padded.  The neck was prepped and draped in the usual sterile fashion.  At this point, I did make a left-sided transverse incision.  The platysma was incised.  A Smith-Robinson approach was used and the anterior spine was identified.  At this point, the previously placed instrumentation was noted.  There was extensive scar and adhesions over the  anterior aspect of the anterior cervical plate and screws.  These adhesions were taken down using Kitners.  Ultimately, all sick screws were removed and the plate was uneventfully removed.  A self-retaining retractor was then placed.  I then subperiosteally exposed the vertebral bodies from C7-T1.  Caspar pins were then placed into the C7 and T1 vertebral bodies and distraction was applied.  A thorough and complete C7/T1 intervertebral diskectomy was performed.  The posterior longitudinal ligament was identified and entered using a nerve hook.  I then used #1 followed by #2 Kerrison to perform a thorough and complete intervertebral diskectomy.  The spinal canal was thoroughly decompressed, as was the right neuroforamen.  The endplates were then prepared and a 6 mm allograft intervertebral spacer was tamped into position in the usual fashion. The Caspar pins then were removed and bone wax was placed in their place.  The appropriate-sized anterior cervical plate was placed over the anterior spine.  14 mm variable angle screws were placed, 2 in each vertebral body from C7-T1 for a total of 4 vertebral body screws.  The screws were then locked to the plate using the Cam locking mechanism.  I was very pleased with the final fluoroscopic images.  The wound was then irrigated.  The wound was then explored for any undue bleeding and there was no bleeding noted. The wound was then closed in layers using 2-0 Vicryl, followed by 4-0 Monocryl.  Benzoin and Steri-Strips were applied, followed  by sterile dressing.  All instrument counts were correct at the termination of the procedure.   Of note, Ileana Clara, PA-C, was my assistant throughout surgery, and did aid in retraction, placement of the hardware, suctioning, and closure from start to finish.         Oneil Priestly, MD

## 2024-09-30 NOTE — H&P (Signed)
 "    PREOPERATIVE H&P  Chief Complaint: Right arm pain  HPI: Adrienne Campbell is a 64 y.o. female who presents with ongoing pain in the right arm  MRI reveals a disc herniation below her fusion, at C7/T1  Patient has failed multiple forms of conservative care and continues to have pain (see office notes for additional details regarding the patient's full course of treatment)  Past Medical History:  Diagnosis Date   Ankylosing spondylitis (HCC)    [id number] positive (Dr. Jon Jacob)   Arthritis    Depression    Environmental and seasonal allergies    GERD (gastroesophageal reflux disease)    with LPR, laryngospasm   Heart murmur    during pregnancy   Hyperlipidemia    Hypertension    Insomnia    Pneumonia    hx of   PONV (postoperative nausea and vomiting)    Nausea, per pt. felt loopy after hip surgery   and made hallucinate   Sleep apnea    Pt unable to wear CPAP mask   Past Surgical History:  Procedure Laterality Date   ABDOMINAL HYSTERECTOMY  2007   ANTERIOR CERVICAL DECOMP/DISCECTOMY FUSION N/A 01/16/2024   Procedure: ANTERIOR CERVICAL DECOMPRESSION/DISCECTOMY FUSION 2 LEVELS;  Surgeon: Beuford Anes, MD;  Location: [facility] OR;  Service: Orthopedics;  Laterality: N/A;  ANTERIOR CERVICAL DECOMPRESSION FUSION CERVICAL 5- CERVICAL 6, CERVICAL 6- CERVICAL 7 WITH INSTRUMENTATION AND ALLOGRAFT   COLONOSCOPY     HIP CLOSED REDUCTION Left 12/03/2021   Procedure: CLOSED REDUCTION HIP;  Surgeon: Dozier Soulier, MD;  Location: [facility] OR;  Service: Orthopedics;  Laterality: Left;   TOTAL HIP ARTHROPLASTY Left 06/28/2014   Procedure: LEFT TOTAL HIP ARTHROPLASTY;  Surgeon: Dempsey JINNY Sensor, MD;  Location: [facility] OR;  Service: Orthopedics;  Laterality: Left;   TOTAL HIP ARTHROPLASTY Right 04/27/2019   Procedure: Right Anterior Hip Arthroplasty;  Surgeon: Sensor Dempsey, MD;  Location: [facility];  Service: Orthopedics;  Laterality: Right;   TOTAL KNEE ARTHROPLASTY Right 08/11/2014    Procedure: RIGHT TOTAL KNEE ARTHROPLASTY;  Surgeon: Dempsey JINNY Sensor, MD;  Location: [facility];  Service: Orthopedics;  Laterality: Right;   TRANSFORAMINAL LUMBAR INTERBODY FUSION (TLIF) WITH PEDICLE SCREW FIXATION 1 LEVEL Right 11/12/2018   Procedure: RIGHT-SIDED LUMBAR 4-5 TRANSFORAMINAL LUMBAR INTERBODY FUSION WITH INSTRUMENTATION AND ALLOGRAFT;  Surgeon: Beuford Anes, MD;  Location: [facility] OR;  Service: Orthopedics;  Laterality: Right;   Social History   Socioeconomic History   Marital status: Married    Spouse name: Not on file   Number of children: Not on file   Years of education: Not on file   Highest education level: Not on file  Occupational History   Not on file  Tobacco Use   Smoking status: Never   Smokeless tobacco: Never  Vaping Use   Vaping status: Never Used  Substance and Sexual Activity   Alcohol  use: Yes    Comment: rarely   Drug use: No   Sexual activity: Yes  Other Topics Concern   Not on file  Social History Narrative   Not on file   Social Drivers of Health   Tobacco Use: Low Risk (09/30/2024)   Patient History    Smoking Tobacco Use: Never    Smokeless Tobacco Use: Never    Passive Exposure: Not on file  Financial Resource Strain: Not on file  Food Insecurity: Not on file  Transportation Needs: Not on file  Physical Activity: Not on file  Stress: Not on file  Social Connections: Not on file  Depression (PHQ2-9): Not on file  Alcohol  Screen: Not on file  Housing: Not on file  Utilities: Not on file  Health Literacy: Not on file   Family History  Problem Relation Age of Onset   Stroke Mother    Heart attack Mother    Coronary artery disease Father    Heart attack Brother    Heart attack Maternal Grandfather    Allergies[1] Prior to Admission medications  Medication Sig Start Date End Date Taking? Authorizing Provider  adalimumab  (HUMIRA , 2 SYRINGE,) 40 MG/0.8ML prefilled syringe Inject 0.8 mLs (40 mg total) into the skin once a week. 09/14/24   Yes Jegede, Olugbemiga E, MD  celecoxib  (CELEBREX ) 200 MG capsule Take 1 capsule (200 mg total) by mouth 2 (two) times daily. 06/01/24  Yes   cholecalciferol  (VITAMIN D ) 25 MCG (1000 UT) tablet Take 1,000 Units by mouth daily.   Yes [provider]  cyclobenzaprine  (FLEXERIL ) 10 MG tablet Take 1 tablet (10 mg total) by mouth daily. Patient taking differently: Take 10 mg by mouth at bedtime. 08/17/24  Yes   Estradiol  (VAGIFEM ) 10 MCG TABS vaginal tablet Place 1 tablet (10 mcg total) vaginally 2 (two) times a week. 03/12/24  Yes   Evolocumab  (REPATHA  SURECLICK) 140 MG/ML SOAJ Inject 140 mg into the skin every 14 (fourteen) days. 03/19/24  Yes Hilty, Vinie BROCKS, MD  ezetimibe  (ZETIA ) 10 MG tablet Take 1 tablet by mouth daily. 02/10/24 02/04/25 Yes Hilty, Vinie BROCKS, MD  hydrOXYzine  (VISTARIL ) 25 MG capsule Take 1 capsule (25 mg total) by mouth at bedtime. 12/26/23  Yes   lisinopril -hydrochlorothiazide  (ZESTORETIC ) 10-12.5 MG tablet Take 1 tablet by mouth daily. 12/26/23  Yes   Meth-Hyo-M Bl-Benz Acd-Ph Sal (URIBEL ) 81.6 MG TABS Take 1 tablet (81.6 mg total) by mouth every 6 (six) hours as needed. 07/31/23  Yes   methocarbamol  (ROBAXIN ) 500 MG tablet Take 1-2 tablets (500-1,000 mg total) by mouth every 6 (six) hours as needed for muscle spasm/tension. Patient taking differently: Take 500-1,000 mg by mouth daily. 08/17/24  Yes   Omega-3 Fatty Acids (FISH OIL PO) Take 1 capsule by mouth daily.   Yes [provider]  omeprazole  (PRILOSEC) 40 MG capsule Take 1 capsule (40 mg total) by mouth daily 30 minutes before morning meal. 06/16/24  Yes   Polyethyl Glycol-Propyl Glycol 0.4-0.3 % SOLN Place 1 drop into both eyes 4 (four) times daily as needed (dry/irritated eyes).   Yes [provider]  venlafaxine  XR (EFFEXOR -XR) 150 MG 24 hr capsule Take 1 capsule (150 mg total) by mouth daily with food. 12/26/23  Yes   vitamin E  400 UNIT capsule Take 400 Units by mouth at bedtime.    Yes [provider]  acyclovir  (ZOVIRAX ) 200 MG capsule Take 2 capsules (400 mg total) by mouth 3 (three) times daily for 5 days for recurrence. Patient taking differently: Take 400 mg by mouth 3 (three) times daily as needed (Flare ups). 12/26/23     icosapent  Ethyl (VASCEPA ) 1 g capsule Take 2 capsules (2 g total) by mouth 2 (two) times daily. Patient not taking: Reported on 09/25/2024 03/27/24   Mona Vinie BROCKS, MD     All other systems have been reviewed and were otherwise negative with the exception of those mentioned in the HPI and as above.  Physical Exam: Vitals:   09/30/24 0606  BP: 127/75  Pulse: 82  Resp: 18  Temp: 98.1 F (36.7 C)  SpO2: 96%  Body mass index is 24.03 kg/m.  General: Alert, no acute distress Cardiovascular: No pedal edema Respiratory: No cyanosis, no use of accessory musculature Skin: No lesions in the area of chief complaint Neurologic: Sensation intact distally Psychiatric: Patient is competent for consent with normal mood and affect Lymphatic: No axillary or cervical lymphadenopathy   Assessment/Plan: CERVICAL RADICULOPATHY Plan for Procedures: ANTERIOR CERVICAL DECOMPRESSION/DISCECTOMY FUSION 1 LEVEL (C7/T1)   Oneil LITTIE Priestly, MD 09/30/2024 7:17 AM    [1]  Allergies Allergen Reactions   Nsaids Hives    Specifically meloxicam and ibuprofen reported   Onion Swelling    SWELLING REACTION UNSPECIFIED    Sulfa Antibiotics Swelling and Anaphylaxis    Patient reports tongue swells   Fluticasone Other (See Comments)    ulcers   Oxycodone  Nausea Only    Severe nausea; pt has to take with Zofran    Tramadol  Hives   Hydrocodone -Acetaminophen  Other (See Comments)    Hallucinations   Ibuprofen     Other reaction(s): hives/itching   Meloxicam     Other reaction(s): pruritus   Rosuvastatin  Calcium      Other reaction(s): cramps   Tizanidine  Hcl     Other reaction(s): Hallucinations   Codeine Nausea And Vomiting   "

## 2024-09-30 NOTE — Anesthesia Procedure Notes (Signed)
 Procedure Name: Intubation Date/Time: 09/30/2024 8:08 AM  Performed by: Genny Gun, CRNAPre-anesthesia Checklist: Patient identified, Emergency Drugs available, Suction available, Patient being monitored and Timeout performed Patient Re-evaluated:Patient Re-evaluated prior to induction Oxygen Delivery Method: Circle system utilized Preoxygenation: Pre-oxygenation with 100% oxygen Induction Type: IV induction Ventilation: Mask ventilation without difficulty and Oral airway inserted - appropriate to patient size Laryngoscope Size: Glidescope and 3 Grade View: Grade I Tube type: Oral Tube size: 7.0 mm Number of attempts: 1 Placement Confirmation: ETT inserted through vocal cords under direct vision, positive ETCO2 and breath sounds checked- equal and bilateral Secured at: 22 cm Tube secured with: Tape Dental Injury: Teeth and Oropharynx as per pre-operative assessment

## 2024-10-01 ENCOUNTER — Other Ambulatory Visit (HOSPITAL_COMMUNITY): Payer: Self-pay

## 2024-10-01 NOTE — Anesthesia Postprocedure Evaluation (Signed)
"   Anesthesia Post Note  Patient: Adrienne Campbell  Procedure(s) Performed: ANTERIOR CERVICAL DECOMPRESSION/DISCECTOMY FUSION CERVICAL SEVEN-THORACIC ONE INSTRUMENTATION ALLOGRAFT     Patient location during evaluation: PACU Anesthesia Type: General Level of consciousness: awake Pain management: pain level controlled Vital Signs Assessment: post-procedure vital signs reviewed and stable Respiratory status: spontaneous breathing, nonlabored ventilation and respiratory function stable Cardiovascular status: blood pressure returned to baseline and stable Postop Assessment: no apparent nausea or vomiting Anesthetic complications: no   No notable events documented.  Last Vitals:  Vitals:   09/30/24 1130 09/30/24 1145  BP: (!) 147/85 (!) 148/87  Pulse: 70 70  Resp: 12 11  Temp:  36.5 C  SpO2: 94% 94%    Last Pain:  Vitals:   09/30/24 1145  PainSc: 0-No pain                 [name]      "

## 2024-10-05 ENCOUNTER — Other Ambulatory Visit (HOSPITAL_COMMUNITY): Payer: Self-pay

## 2024-10-05 ENCOUNTER — Telehealth: Admitting: Nurse Practitioner

## 2024-10-05 DIAGNOSIS — J019 Acute sinusitis, unspecified: Secondary | ICD-10-CM

## 2024-10-05 DIAGNOSIS — R051 Acute cough: Secondary | ICD-10-CM

## 2024-10-05 DIAGNOSIS — B9689 Other specified bacterial agents as the cause of diseases classified elsewhere: Secondary | ICD-10-CM

## 2024-10-05 MED ORDER — AZITHROMYCIN 250 MG PO TABS
ORAL_TABLET | ORAL | 0 refills | Status: AC
  Filled 2024-10-05: qty 6, 5d supply, fill #0

## 2024-10-05 MED ORDER — BENZONATATE 100 MG PO CAPS
100.0000 mg | ORAL_CAPSULE | Freq: Two times a day (BID) | ORAL | 0 refills | Status: AC | PRN
  Filled 2024-10-05: qty 20, 10d supply, fill #0

## 2024-10-06 ENCOUNTER — Encounter (HOSPITAL_COMMUNITY): Payer: Self-pay | Admitting: Orthopedic Surgery

## 2024-10-08 ENCOUNTER — Other Ambulatory Visit (HOSPITAL_COMMUNITY): Payer: Self-pay
